# Patient Record
Sex: Male | Born: 1935 | Race: White | Hispanic: No | Smoking: Former smoker
Health system: Southern US, Community
[De-identification: ages and names within clinical notes are randomized; demographics above are authoritative.]

## PROBLEM LIST (undated history)

## (undated) DIAGNOSIS — I10 Essential (primary) hypertension: Secondary | ICD-10-CM

## (undated) DIAGNOSIS — E119 Type 2 diabetes mellitus without complications: Secondary | ICD-10-CM

## (undated) HISTORY — PX: EYE SURGERY: SHX253

## (undated) HISTORY — PX: CHOLECYSTECTOMY: SHX55

## (undated) HISTORY — PX: CATARACT EXTRACTION, BILATERAL: SHX1313

## (undated) HISTORY — PX: THYROID CYST EXCISION: SHX2511

---

## 2003-09-30 ENCOUNTER — Ambulatory Visit (HOSPITAL_COMMUNITY): Admission: RE | Admit: 2003-09-30 | Discharge: 2003-09-30 | Payer: Self-pay | Admitting: Family Medicine

## 2003-10-12 ENCOUNTER — Ambulatory Visit (HOSPITAL_COMMUNITY): Admission: RE | Admit: 2003-10-12 | Discharge: 2003-10-12 | Payer: Self-pay | Admitting: Family Medicine

## 2007-10-07 ENCOUNTER — Ambulatory Visit (HOSPITAL_COMMUNITY): Admission: RE | Admit: 2007-10-07 | Discharge: 2007-10-07 | Payer: Self-pay | Admitting: General Surgery

## 2010-09-14 ENCOUNTER — Inpatient Hospital Stay (HOSPITAL_COMMUNITY): Admission: EM | Admit: 2010-09-14 | Discharge: 2010-09-16 | Payer: Self-pay | Source: Home / Self Care

## 2010-09-14 LAB — URINALYSIS, ROUTINE W REFLEX MICROSCOPIC
Nitrite: NEGATIVE
Protein, ur: NEGATIVE mg/dL
Specific Gravity, Urine: 1.015 (ref 1.005–1.030)
Urobilinogen, UA: 0.2 mg/dL (ref 0.0–1.0)

## 2010-09-14 LAB — CBC
HCT: 38.8 % — ABNORMAL LOW (ref 39.0–52.0)
MCH: 33.4 pg (ref 26.0–34.0)
MCV: 93.3 fL (ref 78.0–100.0)
RBC: 4.16 MIL/uL — ABNORMAL LOW (ref 4.22–5.81)
RDW: 13.8 % (ref 11.5–15.5)
WBC: 6.5 10*3/uL (ref 4.0–10.5)

## 2010-09-14 LAB — DIFFERENTIAL
Eosinophils Absolute: 0.1 10*3/uL (ref 0.0–0.7)
Eosinophils Relative: 1 % (ref 0–5)
Lymphocytes Relative: 28 % (ref 12–46)
Lymphs Abs: 1.8 10*3/uL (ref 0.7–4.0)
Monocytes Relative: 10 % (ref 3–12)

## 2010-09-14 LAB — COMPREHENSIVE METABOLIC PANEL
Alkaline Phosphatase: 65 U/L (ref 39–117)
BUN: 18 mg/dL (ref 6–23)
CO2: 29 mEq/L (ref 19–32)
GFR calc non Af Amer: >60 mL/min (ref >60)
Glucose, Bld: 128 mg/dL — ABNORMAL HIGH (ref 70–99)
Potassium: 4.3 mEq/L (ref 3.5–5.1)
Sodium: 137 mEq/L (ref 135–145)
Total Bilirubin: 0.5 mg/dL (ref 0.3–1.2)

## 2010-09-14 LAB — GLUCOSE, CAPILLARY: Glucose-Capillary: 185 mg/dL — ABNORMAL HIGH (ref 70–99)

## 2010-09-14 LAB — POCT CARDIAC MARKERS: Troponin i, poc: <0.05 ng/mL (ref 0.00–0.09)

## 2010-09-15 LAB — COMPREHENSIVE METABOLIC PANEL
ALT: 14 U/L (ref 0–53)
Alkaline Phosphatase: 47 U/L (ref 39–117)
CO2: 25 mEq/L (ref 19–32)
Chloride: 109 mEq/L (ref 96–112)
Glucose, Bld: 231 mg/dL — ABNORMAL HIGH (ref 70–99)
Potassium: 4.3 mEq/L (ref 3.5–5.1)
Sodium: 137 mEq/L (ref 135–145)
Total Bilirubin: 0.3 mg/dL (ref 0.3–1.2)
Total Protein: 5.1 g/dL — ABNORMAL LOW (ref 6.0–8.3)

## 2010-09-15 LAB — GLUCOSE, CAPILLARY
Glucose-Capillary: 166 mg/dL — ABNORMAL HIGH (ref 70–99)
Glucose-Capillary: 254 mg/dL — ABNORMAL HIGH (ref 70–99)

## 2010-09-15 LAB — TSH: TSH: 3.218 u[IU]/mL (ref 0.350–4.500)

## 2010-09-15 LAB — CALCIUM
Calcium: 11.6 mg/dL — ABNORMAL HIGH (ref 8.4–10.5)
Calcium: 11.3 mg/dL — ABNORMAL HIGH (ref 8.4–10.5)

## 2010-09-16 LAB — GLUCOSE, CAPILLARY
Glucose-Capillary: 70 mg/dL (ref 70–99)
Glucose-Capillary: 175 mg/dL — ABNORMAL HIGH (ref 70–99)

## 2010-09-16 LAB — CALCIUM: Calcium: 10.9 mg/dL — ABNORMAL HIGH (ref 8.4–10.5)

## 2010-09-17 LAB — VITAMIN D 1,25 DIHYDROXY
Vitamin D 1, 25 (OH)2 Total: 73 pg/mL — ABNORMAL HIGH (ref 18–72)
Vitamin D2 1, 25 (OH)2: <8 pg/mL
Vitamin D3 1, 25 (OH)2: 73 pg/mL

## 2010-09-18 ENCOUNTER — Encounter (HOSPITAL_COMMUNITY)
Admission: RE | Admit: 2010-09-18 | Discharge: 2010-09-19 | Payer: Self-pay | Source: Home / Self Care | Attending: General Surgery | Admitting: General Surgery

## 2010-09-19 ENCOUNTER — Ambulatory Visit (HOSPITAL_COMMUNITY): Admission: RE | Admit: 2010-09-19 | Payer: Self-pay | Source: Home / Self Care | Admitting: General Surgery

## 2010-09-19 ENCOUNTER — Encounter (HOSPITAL_COMMUNITY)
Admission: RE | Admit: 2010-09-19 | Discharge: 2010-09-19 | Payer: Self-pay | Source: Home / Self Care | Attending: General Surgery | Admitting: General Surgery

## 2010-09-23 ENCOUNTER — Encounter: Payer: Self-pay | Admitting: General Surgery

## 2010-09-23 NOTE — Discharge Summary (Signed)
NAME:  Jeremy Ferrell, Jeremy Ferrell NO.:  [id number]  MEDICAL RECORD NO.:  [id number]          PATIENT TYPE:  OBV  LOCATION:  A304                          FACILITY:  [facility]  PHYSICIAN:  Pleas Koch, MD        DATE OF BIRTH:  Jan 25, 1935  DATE OF ADMISSION:  09/14/2010 DATE OF DISCHARGE:  01/28/2012LH                              DISCHARGE SUMMARY   PRIMARY CARE PHYSICIAN:  Corrie Mckusick, MD  REFERRING PHYSICIAN:  Donnetta Hutching, MD  CONSULTS:  Dr. Malvin Johns with the surgery.  DISCHARGE DIAGNOSES: 1. Hypercalcemia likely secondary to primary hyperparathyroidism from     an adenoma. 2. Diabetes mellitus, well controlled. 3. Hypertension. 4. Hyperlipidemia. 5. Tachycardia (resolved).  DISCHARGE MEDICATIONS: 1. Ramipril 10 mg 1 tablet daily. 2. Metoprolol tartrate 50 mg 1 tablet b.i.d. 3. Vytorin 10/20 one tablet at bedtime. 4. Lantus 65 units at bedtime. 5. Starlix 120 mg p.o. t.i.d. 6. Hydrochlorothiazide 25 mg 1 tablet daily. 7. Actos met 15/500 two tablets daily, Actoplus. 8. Metformin 1 tablet at bedtime. 9. New medications may include pamidronate 90 mg IV every 10 days if     the patient does not have surgery.  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 Dickey Gave, status post cholecystectomy, hypertension, hyperlipidemia and former smoker who presents with elevated calcium level.  Checked at Dr. Lamar Blinks office to 13.7 and the patient actually had no altered mental status, nausea, vomiting, diarrhea, moans, groans, abdominal pain.  He had no neurological dysfunction,  stupor, coma, and denied any anorexia, muscle weakness either.  He was hospitalized for moderate hypercalcemia, which was relatively asymptomatic and he was started on aggressive fluid repletion 200 mL an hour.  Subsequently, this was decreased on day of discharge. He had an  extensive workup, which included a parathyroid level hormone, which was 266.2.  His calcium was also elevated at 13.5, which points to primary hyperparathyroidism, which would likely include adenoma 85% of these are adenomatous.  His phosphorus was 1.5, which would be expected as being low.  I consulted on Dr. Malvin Johns with general surgery who recommends that the patient has a technetium uptake scan of the area as an outpatient and he is actually scheduled that.  He is also recommended consulting Dr. Synetta Fail for this surgery and has stated to me that the surgical take place in likely 1 week.  In attendance of this, if he does not have this surgery, I will place him on pamidronate 90 mg IV every 10-14 days as per his primary care physician Dr. Phillips Odor.  The patient have a calcium level repeated at primary care physician's office in 3-5 days and he understands this. But the patient is stable to go.  He received one dose pamidronate and is currently getting IV fluids to ensure adequate distribution of this and no acute renal insufficiency secondary to this.  I have counseled him to drink copious amounts of liquids subsequent to this and I had recommended to the patient likely we beneficial to stay another day. However, the  patient is not willing to do the same.  As such, the patient just needs to have CMET done in addition to a calcium, phosphorus levels in the outpatient setting.  The patient is agreeable to this.  His diabetes well controlled in the hospital.  He was kept on insulin, kept off his other oral hypoglycemic agents, which will be reimplanted with caution on discharge given him using bisphosphonate.  His BUN and creatinine on discharge were 18 and 0.93, which is better than admission 18 and 1.06.  Hypertension.  This is well-controlled in hospital setting and the patient was stable from this standpoint.  I see no reason why he should not undergo surgery as he has no real  cardiac history in the past.                                           ______________________________ Pleas Koch, MD     JS/MEDQ  D:  09/16/2010  T:  09/16/2010  Job:  [id number]  cc:   Corrie Mckusick, M.D. Fax: [phone]  Barbaraann Barthel, M.D. Fax: [phone]  Electronically Signed by Pleas Koch MD on 09/23/2010 06:41:57 PM

## 2010-09-23 NOTE — H&P (Signed)
NAME:  JENCARLO, BONADONNA NO.:  [id number]  MEDICAL RECORD NO.:  [id number]          PATIENT TYPE:  EMS  LOCATION:  ED                            FACILITY:  [facility]  PHYSICIAN:  Pleas Koch, MD        DATE OF BIRTH:  October 08, 1934  DATE OF ADMISSION:  09/14/2010 DATE OF DISCHARGE:  LH                             HISTORY & PHYSICAL   PRIMARY CARE PHYSICIAN:  Corrie Mckusick, MD  REFERRING PHYSICIAN:  Donnetta Hutching, MD  CHIEF COMPLAINT:  Abnormal calcium level.  HISTORY OF PRESENT ILLNESS:  This is a pleasant 76 year old Caucasian male with known history of hypertension, cataract disease, history of thyroglossal cyst removal in the 1950s by Dr. Dickey Gave, status post cholecystectomy, hypertension, hyperlipidemia and former smoker who presents with elevated calcium level that was rechecked at Dr. Lamar Blinks office this morning after prior one being 13 last night.  It was noted to be similarly elevated and the patient was sent to the ED for workup. The patient actually has no real complaints of increased thirst or nausea, vomiting, diarrhea or diureses.  He states he has no neurological dysfunction, stupor, coma, constipation, anorexia and nausea.  Denies any anorexia or muscle weakness either.  In fact, the patient is not really sure what all the fuss is about.  PAST MEDICAL HISTORY:  Significant for above.  PAST SURGICAL HISTORY:  The patient states he had thyroglossal cyst, taken out about 20 years ago by Dr. Dickey Gave for an abscess and part of the hyoid bone was removed as well.  He also had gallbladder removed about 30 years ago.  He has hyperlipidemia and is on statin for this. He also has macular degeneration and cataracts, which have been removed couple of years ago.  He states that he has not taken any new medications and denies taking more than his regular dose of hydrochlorothiazide.  SOCIAL HISTORY:  He used to smoke about 30 years ago.  Smoked half pack per  day for 10-15 years and used strength beer occasionally in the past. Does not drink any more.  The patient used to work at [employer] and retired 32 years, at age 70.  He looks at home alone right now.  ALLERGIES:  The patient is allergic to PENICILLIN.  MEDICATIONS:  From home are not reconsulted, but include; 1. Vytorin 10/20 once a day. 2. Starlix 120 mg 3 times day. 3. Metoprolol tartrate 50 mg twice daily. 4. Lantus 65 units at night per patient report. 5. Hydrochlorothiazide 12.5 mg per the patient report. 6. Actoplus 15/500 daily. 7. Altace 10 mg once daily.  FAMILY HISTORY:  His father had cancer of the lung and died of this at the age of 68.  Mother had lymphoma, but died from bacterial meningitis at age of 24.  Review of his chart actually reveals that he has been seen by Dr. Lovell Sheehan in 2009 and in fact did have a screening colonoscopy results of which are not available at this point in time.  The patient also has radiological abnormality in 2005 that he states has been there for a while, it looks  like he had area of scarring and pleural thickening in the right lung base with small amounts of calcification probably representing old granulomatous lesion and they recommended followup CT in 4-6 months to establish stability.  PHYSICAL EXAMINATION:  GENERAL:  The patient is a very pleasant Caucasian male in no acute distress. VITAL SIGNS:  His initial blood pressures are 184/85, pulse was 119, respirations are 24, pulse ox 96 and when these were repeated and it was noted that the patient has not had his beta blocker for this evening. HEENT:  The patient is alert, oriented.  Noted cataract changes.  Left eye fundus did not fully visualized on either side.  No scleral icterus. No pallor.  The patient has scar in the upper neck region near the chin and then another one lower down.  He has no thyromegaly or submandibular lymphadenopathy.  No posterior cervical  lymphadenopathy or supraclavicular lymphadenopathy either. CHEST:  Clinically clear with no tactile vocal fremitus or resonance. HEART:  S1, S2 tachycardic with regular tachycardia and on monitor it seems to be sinus tach.  No murmurs appreciated.  Point of maximal impulse is within the mid axillary line of fifth intercostal space. ABDOMEN:  Soft, nontender, nondistended.  Good bowel sounds. NEUROLOGIC:  He has slightly brisk reflexes; however, grossly cranial nerves II-XII intact.  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  His cardiac markers point of care CK-MB is 1.3, troponin was less than 0.05, myoglobin is 168.  His CBC showed hemoglobin 13.9, hematocrit 38.8, WBC 6.5, platelet count of 269.  His differential is totally normal.  His RDW is 13.8.  CMP shows sodium 137, potassium 4.3, chloride of 102, CO2 of 29, glucose 128, BUN of 18, creatinine 1.06, bilirubin 0.5, alk phos of 65, SGOT of 21, SGPT is 17, total protein 6.6, albumin was 3.9, calcium was 13.7.  Urinalysis was entirely normal.  ASSESSMENT: 1. Moderate hypercalcemia, which is currently asymptomatic.  I will     aggressively hydrate this gentleman to prevent renal compromise     secondary to what could result from acute tubular necrosis     secondary to crystallization of calcium.  We will work him up with     intact PTH level and a phosphorus in addition to labs that were     drawn by the ED.  If further determination, we will rest on these     levels.  It is likely that he may have primary hypocalcemia     secondary to an adenoma versus this being malignancy related in     view of his past smoking history.  Indeed his chest x-ray now shows     a lipoma, which has been documented in 2005, this may warrant     biopsy if the PTH is not elevated.  I had a full disclosure  with     the family and explained to him that this could very well likely be     cancer.  The patient is understanding the same and we will await     results. 2. Diabetes mellitus.  We will hold his hypoglycemics other than for     his Lantus 65 units q.p.m. and we will cover with sliding scale     insulin. 3. Hypertension.  I will discontinue hydrochlorothiazide for now and  we will keep him on his regular home medications for blood pressure     other than that I may have to increase his medications.  We will do     so transiently. 4. Hyperlipidemia.  Continue Vytorin. 5. Tachycardia, this is likely secondary to rebound tachycardia from     withdrawal beta-blocker and we will not aggressively work this out.                                           ______________________________ Pleas Koch, MD     JS/MEDQ  D:  09/14/2010  T:  09/14/2010  Job:  [id number]  cc:   Corrie Mckusick, M.D. Fax: [phone]  Electronically Signed by Pleas Koch MD on 09/23/2010 06:42:41 PM

## 2010-09-25 ENCOUNTER — Other Ambulatory Visit (HOSPITAL_COMMUNITY): Payer: Self-pay | Admitting: Internal Medicine

## 2010-09-25 NOTE — Consult Note (Signed)
  NAME:  Jeremy Ferrell, Jeremy Ferrell NO.:  [id number]  MEDICAL RECORD NO.:  [id number]          PATIENT TYPE:  OBV  LOCATION:  A304                          FACILITY:  [facility]  PHYSICIAN:  Barbaraann Barthel, M.D. DATE OF BIRTH:  [birth year]  DATE OF CONSULTATION:  09/16/2010 DATE OF DISCHARGE:  09/16/2010                                CONSULTATION   NOTE:  Surgery was asked to see this 76 year old white male for hyperparathyroidism.  This patient was seen and examined and his chart was reviewed.  In essence, he is a 76 year old white male who interestingly 40 years ago underwent surgery by Dr. Loraine Leriche for a thyroglossal cyst.  His other medical problems include diabetes type 1 and hypertension and he presented incidentally with hypocalcemia.  This was worked up when he was noted that his calcium levels were actually a very high at 13.5. They are presently at 10.7 with medical treatment and he underwent an examination of his parathyroid hormone level which showed that this was quite elevated at 266 with a normal range being between 14 and 72. Thyroid-stimulating hormone was also done and this was within normal limits at 3.2 with a normal range being 0.35-4.5.  Clinically, the patient has no palpable masses or adenopathy and then and other review of his labs reviewed that his current calcium level is 10.7 with a phosphorus of 1.5.  He has had no history of bone pain or depression or lethargy or kidney stones and it appears that he has primary hyperparathyroidism from the laboratory data reviewed.  I have discussed the need for further workup with him including a parathyroid scan.  This can be coordinated as an outpatient, although it should be ordered and scheduled at the present time.  I also think that there is benefit seeing the endocrinologist available to as Dr. Avel Peace for his input prior to his discharge.  Surgically, I discussed this frankly with him if this is a  parathyroid adenoma that is obvious and apparent on thyroid scan, I would feel comfortable removing this.  If it is something more complicated, I think a tertiary referral would be necessary as this would go beyond my experience level.  I am comfortable with removing a parathyroid adenoma and have some experience in this.  However, if he has hyperplasia or other findings, I would think would be better served elsewhere.  I discussed this quite frankly with him and we have made followup arrangements in my office.  At that time, I should be able to be to review his parathyroid scan and I can make an appropriate surgical referral at that time.     Barbaraann Barthel, M.D.     WB/MEDQ  D:  09/16/2010  T:  09/17/2010  Job:  [id number]  cc:   Corrie Mckusick, M.D. Fax: [phone]  Electronically Signed by Barbaraann Barthel M.D. on 09/25/2010 11:06:01 AM

## 2010-09-26 ENCOUNTER — Ambulatory Visit (HOSPITAL_COMMUNITY)
Admission: RE | Admit: 2010-09-26 | Discharge: 2010-09-26 | Disposition: A | Discharge status: Home / Self Care | Payer: Medicare Other | Source: Ambulatory Visit | Attending: Internal Medicine | Admitting: Internal Medicine

## 2010-09-26 DIAGNOSIS — E042 Nontoxic multinodular goiter: Secondary | ICD-10-CM | POA: Insufficient documentation

## 2011-01-02 NOTE — H&P (Signed)
NAME:  LOTT, SEELBACH NO.:  [id number]   MEDICAL RECORD NO.:  [id number]           PATIENT TYPE:  AMB   LOCATION:  DAY                           FACILITY:  [facility]   PHYSICIAN:  Dalia Heading, M.D.  DATE OF BIRTH:  1935/06/30   DATE OF ADMISSION:  DATE OF DISCHARGE:  LH                              HISTORY & PHYSICAL   CHIEF COMPLAINT:  Need for screening colonoscopy.   HISTORY OF PRESENT ILLNESS:  Patient is a 76 year old white male who is  referred for endoscopic evaluation.  He needs a colonoscopy for  screening purposes.  No abdominal pain, weight loss, nausea, vomiting,  diarrhea, constipation, melena, or hematochezia have been noted.  He has  never had a colonoscopy.  There is no family history of colon carcinoma.   PAST MEDICAL HISTORY:  Includes:  1. Insulin-dependent diabetes mellitus.  2. Hypertension.   PAST SURGICAL HISTORY:  Unremarkable.   CURRENT MEDICATIONS:  1. Actoplus.  2. Lantus insulin.  3. Starlix.  4. Ramipril.  5. Vytorin.  6. Metoprolol.  7. Hydrochlorothiazide.   ALLERGIES:  PENICILLIN.   REVIEW OF SYSTEMS:  Noncontributory.   PHYSICAL EXAMINATION:  Patient is a well-developed, well-nourished,  white male in no acute distress.  LUNGS:  Clear to auscultation with equal breath sounds bilaterally.  HEART:  Examination reveals a regular rate and rhythm without S3, S4,  murmurs.  ABDOMEN:  Soft, nontender, nondistended.  No hepatosplenomegaly or  masses are noted.  RECTAL:  Examination was deferred to the procedure.   IMPRESSION:  Need for screening colonoscopy.   PLAN:  The patient is scheduled for a colonoscopy on October 07, 2007.  The risks and benefits of the procedure including bleeding and  perforation were fully explained to the patient, gave informed consent.      Dalia Heading, M.D.  Electronically Signed     MAJ/MEDQ  D:  09/25/2007  T:  09/26/2007  Job:  [id number]   cc:   Dalia Heading, M.D.  Fax:  [phone]   Corrie Mckusick, M.D.  Fax: [phone]

## 2011-11-06 DIAGNOSIS — E785 Hyperlipidemia, unspecified: Secondary | ICD-10-CM | POA: Diagnosis not present

## 2011-11-06 DIAGNOSIS — J069 Acute upper respiratory infection, unspecified: Secondary | ICD-10-CM | POA: Diagnosis not present

## 2011-11-06 DIAGNOSIS — E119 Type 2 diabetes mellitus without complications: Secondary | ICD-10-CM | POA: Diagnosis not present

## 2011-11-06 DIAGNOSIS — I10 Essential (primary) hypertension: Secondary | ICD-10-CM | POA: Diagnosis not present

## 2011-11-06 DIAGNOSIS — Z6831 Body mass index (BMI) 31.0-31.9, adult: Secondary | ICD-10-CM | POA: Diagnosis not present

## 2011-11-12 DIAGNOSIS — H35059 Retinal neovascularization, unspecified, unspecified eye: Secondary | ICD-10-CM | POA: Diagnosis not present

## 2011-11-12 DIAGNOSIS — H353190 Nonexudative age-related macular degeneration, unspecified eye, stage unspecified: Secondary | ICD-10-CM | POA: Diagnosis not present

## 2011-11-12 DIAGNOSIS — H353290 Exudative age-related macular degeneration, unspecified eye, stage unspecified: Secondary | ICD-10-CM | POA: Diagnosis not present

## 2011-11-12 DIAGNOSIS — H43819 Vitreous degeneration, unspecified eye: Secondary | ICD-10-CM | POA: Diagnosis not present

## 2012-03-17 DIAGNOSIS — Z6832 Body mass index (BMI) 32.0-32.9, adult: Secondary | ICD-10-CM | POA: Diagnosis not present

## 2012-03-17 DIAGNOSIS — I10 Essential (primary) hypertension: Secondary | ICD-10-CM | POA: Diagnosis not present

## 2012-03-17 DIAGNOSIS — E109 Type 1 diabetes mellitus without complications: Secondary | ICD-10-CM | POA: Diagnosis not present

## 2012-03-17 DIAGNOSIS — E785 Hyperlipidemia, unspecified: Secondary | ICD-10-CM | POA: Diagnosis not present

## 2012-06-19 DIAGNOSIS — Z23 Encounter for immunization: Secondary | ICD-10-CM | POA: Diagnosis not present

## 2012-07-01 DIAGNOSIS — N401 Enlarged prostate with lower urinary tract symptoms: Secondary | ICD-10-CM | POA: Diagnosis not present

## 2012-07-08 DIAGNOSIS — R972 Elevated prostate specific antigen [PSA]: Secondary | ICD-10-CM | POA: Diagnosis not present

## 2012-07-08 DIAGNOSIS — N401 Enlarged prostate with lower urinary tract symptoms: Secondary | ICD-10-CM | POA: Diagnosis not present

## 2012-07-24 DIAGNOSIS — E119 Type 2 diabetes mellitus without complications: Secondary | ICD-10-CM | POA: Diagnosis not present

## 2012-07-24 DIAGNOSIS — I10 Essential (primary) hypertension: Secondary | ICD-10-CM | POA: Diagnosis not present

## 2012-07-28 DIAGNOSIS — Z0000 Encounter for general adult medical examination without abnormal findings: Secondary | ICD-10-CM | POA: Diagnosis not present

## 2012-10-28 DIAGNOSIS — Z6832 Body mass index (BMI) 32.0-32.9, adult: Secondary | ICD-10-CM | POA: Diagnosis not present

## 2012-10-28 DIAGNOSIS — E109 Type 1 diabetes mellitus without complications: Secondary | ICD-10-CM | POA: Diagnosis not present

## 2012-10-28 DIAGNOSIS — E785 Hyperlipidemia, unspecified: Secondary | ICD-10-CM | POA: Diagnosis not present

## 2012-10-28 DIAGNOSIS — I10 Essential (primary) hypertension: Secondary | ICD-10-CM | POA: Diagnosis not present

## 2012-11-14 DIAGNOSIS — H43819 Vitreous degeneration, unspecified eye: Secondary | ICD-10-CM | POA: Diagnosis not present

## 2012-11-14 DIAGNOSIS — H353190 Nonexudative age-related macular degeneration, unspecified eye, stage unspecified: Secondary | ICD-10-CM | POA: Diagnosis not present

## 2012-11-14 DIAGNOSIS — H35059 Retinal neovascularization, unspecified, unspecified eye: Secondary | ICD-10-CM | POA: Diagnosis not present

## 2012-11-14 DIAGNOSIS — H353290 Exudative age-related macular degeneration, unspecified eye, stage unspecified: Secondary | ICD-10-CM | POA: Diagnosis not present

## 2012-11-21 DIAGNOSIS — H353290 Exudative age-related macular degeneration, unspecified eye, stage unspecified: Secondary | ICD-10-CM | POA: Diagnosis not present

## 2012-11-21 DIAGNOSIS — H353190 Nonexudative age-related macular degeneration, unspecified eye, stage unspecified: Secondary | ICD-10-CM | POA: Diagnosis not present

## 2012-12-19 DIAGNOSIS — H35059 Retinal neovascularization, unspecified, unspecified eye: Secondary | ICD-10-CM | POA: Diagnosis not present

## 2012-12-19 DIAGNOSIS — H353290 Exudative age-related macular degeneration, unspecified eye, stage unspecified: Secondary | ICD-10-CM | POA: Diagnosis not present

## 2013-01-21 DIAGNOSIS — H353190 Nonexudative age-related macular degeneration, unspecified eye, stage unspecified: Secondary | ICD-10-CM | POA: Diagnosis not present

## 2013-01-21 DIAGNOSIS — H35059 Retinal neovascularization, unspecified, unspecified eye: Secondary | ICD-10-CM | POA: Diagnosis not present

## 2013-01-21 DIAGNOSIS — H353290 Exudative age-related macular degeneration, unspecified eye, stage unspecified: Secondary | ICD-10-CM | POA: Diagnosis not present

## 2013-01-21 DIAGNOSIS — H43819 Vitreous degeneration, unspecified eye: Secondary | ICD-10-CM | POA: Diagnosis not present

## 2013-03-02 DIAGNOSIS — Z23 Encounter for immunization: Secondary | ICD-10-CM | POA: Diagnosis not present

## 2013-03-02 DIAGNOSIS — Z6832 Body mass index (BMI) 32.0-32.9, adult: Secondary | ICD-10-CM | POA: Diagnosis not present

## 2013-03-02 DIAGNOSIS — E785 Hyperlipidemia, unspecified: Secondary | ICD-10-CM | POA: Diagnosis not present

## 2013-03-02 DIAGNOSIS — E109 Type 1 diabetes mellitus without complications: Secondary | ICD-10-CM | POA: Diagnosis not present

## 2013-03-02 DIAGNOSIS — I10 Essential (primary) hypertension: Secondary | ICD-10-CM | POA: Diagnosis not present

## 2013-03-13 DIAGNOSIS — H43819 Vitreous degeneration, unspecified eye: Secondary | ICD-10-CM | POA: Diagnosis not present

## 2013-03-13 DIAGNOSIS — H353290 Exudative age-related macular degeneration, unspecified eye, stage unspecified: Secondary | ICD-10-CM | POA: Diagnosis not present

## 2013-04-01 DIAGNOSIS — Z23 Encounter for immunization: Secondary | ICD-10-CM | POA: Diagnosis not present

## 2013-04-24 DIAGNOSIS — H353190 Nonexudative age-related macular degeneration, unspecified eye, stage unspecified: Secondary | ICD-10-CM | POA: Diagnosis not present

## 2013-04-24 DIAGNOSIS — H353290 Exudative age-related macular degeneration, unspecified eye, stage unspecified: Secondary | ICD-10-CM | POA: Diagnosis not present

## 2013-04-24 DIAGNOSIS — H35059 Retinal neovascularization, unspecified, unspecified eye: Secondary | ICD-10-CM | POA: Diagnosis not present

## 2013-04-24 DIAGNOSIS — H43819 Vitreous degeneration, unspecified eye: Secondary | ICD-10-CM | POA: Diagnosis not present

## 2013-06-05 DIAGNOSIS — H353290 Exudative age-related macular degeneration, unspecified eye, stage unspecified: Secondary | ICD-10-CM | POA: Diagnosis not present

## 2013-06-05 DIAGNOSIS — H35059 Retinal neovascularization, unspecified, unspecified eye: Secondary | ICD-10-CM | POA: Diagnosis not present

## 2013-06-05 DIAGNOSIS — H43819 Vitreous degeneration, unspecified eye: Secondary | ICD-10-CM | POA: Diagnosis not present

## 2013-06-08 DIAGNOSIS — Z23 Encounter for immunization: Secondary | ICD-10-CM | POA: Diagnosis not present

## 2013-07-01 DIAGNOSIS — R972 Elevated prostate specific antigen [PSA]: Secondary | ICD-10-CM | POA: Diagnosis not present

## 2013-07-08 DIAGNOSIS — N139 Obstructive and reflux uropathy, unspecified: Secondary | ICD-10-CM | POA: Diagnosis not present

## 2013-07-08 DIAGNOSIS — N401 Enlarged prostate with lower urinary tract symptoms: Secondary | ICD-10-CM | POA: Diagnosis not present

## 2013-07-08 DIAGNOSIS — R972 Elevated prostate specific antigen [PSA]: Secondary | ICD-10-CM | POA: Diagnosis not present

## 2013-07-24 DIAGNOSIS — H353190 Nonexudative age-related macular degeneration, unspecified eye, stage unspecified: Secondary | ICD-10-CM | POA: Diagnosis not present

## 2013-07-24 DIAGNOSIS — E11329 Type 2 diabetes mellitus with mild nonproliferative diabetic retinopathy without macular edema: Secondary | ICD-10-CM | POA: Diagnosis not present

## 2013-07-24 DIAGNOSIS — H35059 Retinal neovascularization, unspecified, unspecified eye: Secondary | ICD-10-CM | POA: Diagnosis not present

## 2013-07-24 DIAGNOSIS — H353290 Exudative age-related macular degeneration, unspecified eye, stage unspecified: Secondary | ICD-10-CM | POA: Diagnosis not present

## 2013-08-04 DIAGNOSIS — E785 Hyperlipidemia, unspecified: Secondary | ICD-10-CM | POA: Diagnosis not present

## 2013-08-04 DIAGNOSIS — I10 Essential (primary) hypertension: Secondary | ICD-10-CM | POA: Diagnosis not present

## 2013-08-04 DIAGNOSIS — Z6832 Body mass index (BMI) 32.0-32.9, adult: Secondary | ICD-10-CM | POA: Diagnosis not present

## 2013-08-04 DIAGNOSIS — J209 Acute bronchitis, unspecified: Secondary | ICD-10-CM | POA: Diagnosis not present

## 2013-08-04 DIAGNOSIS — E119 Type 2 diabetes mellitus without complications: Secondary | ICD-10-CM | POA: Diagnosis not present

## 2013-08-10 DIAGNOSIS — D0460 Carcinoma in situ of skin of unspecified upper limb, including shoulder: Secondary | ICD-10-CM | POA: Diagnosis not present

## 2013-08-10 DIAGNOSIS — L820 Inflamed seborrheic keratosis: Secondary | ICD-10-CM | POA: Diagnosis not present

## 2013-08-10 DIAGNOSIS — D235 Other benign neoplasm of skin of trunk: Secondary | ICD-10-CM | POA: Diagnosis not present

## 2013-08-10 DIAGNOSIS — L570 Actinic keratosis: Secondary | ICD-10-CM | POA: Diagnosis not present

## 2013-09-01 DIAGNOSIS — Z23 Encounter for immunization: Secondary | ICD-10-CM | POA: Diagnosis not present

## 2013-09-04 DIAGNOSIS — H35059 Retinal neovascularization, unspecified, unspecified eye: Secondary | ICD-10-CM | POA: Diagnosis not present

## 2013-09-04 DIAGNOSIS — H353290 Exudative age-related macular degeneration, unspecified eye, stage unspecified: Secondary | ICD-10-CM | POA: Diagnosis not present

## 2013-09-04 DIAGNOSIS — E11329 Type 2 diabetes mellitus with mild nonproliferative diabetic retinopathy without macular edema: Secondary | ICD-10-CM | POA: Diagnosis not present

## 2013-09-04 DIAGNOSIS — H353190 Nonexudative age-related macular degeneration, unspecified eye, stage unspecified: Secondary | ICD-10-CM | POA: Diagnosis not present

## 2013-10-16 DIAGNOSIS — H35059 Retinal neovascularization, unspecified, unspecified eye: Secondary | ICD-10-CM | POA: Diagnosis not present

## 2013-10-16 DIAGNOSIS — H353190 Nonexudative age-related macular degeneration, unspecified eye, stage unspecified: Secondary | ICD-10-CM | POA: Diagnosis not present

## 2013-10-16 DIAGNOSIS — H353290 Exudative age-related macular degeneration, unspecified eye, stage unspecified: Secondary | ICD-10-CM | POA: Diagnosis not present

## 2013-11-02 DIAGNOSIS — Z6832 Body mass index (BMI) 32.0-32.9, adult: Secondary | ICD-10-CM | POA: Diagnosis not present

## 2013-11-02 DIAGNOSIS — E109 Type 1 diabetes mellitus without complications: Secondary | ICD-10-CM | POA: Diagnosis not present

## 2013-11-02 DIAGNOSIS — E785 Hyperlipidemia, unspecified: Secondary | ICD-10-CM | POA: Diagnosis not present

## 2013-11-02 DIAGNOSIS — I10 Essential (primary) hypertension: Secondary | ICD-10-CM | POA: Diagnosis not present

## 2013-11-02 DIAGNOSIS — Z23 Encounter for immunization: Secondary | ICD-10-CM | POA: Diagnosis not present

## 2013-11-02 DIAGNOSIS — J069 Acute upper respiratory infection, unspecified: Secondary | ICD-10-CM | POA: Diagnosis not present

## 2013-11-03 DIAGNOSIS — H353290 Exudative age-related macular degeneration, unspecified eye, stage unspecified: Secondary | ICD-10-CM | POA: Diagnosis not present

## 2013-11-17 ENCOUNTER — Ambulatory Visit (HOSPITAL_COMMUNITY)
Admission: RE | Admit: 2013-11-17 | Discharge: 2013-11-17 | Disposition: A | Discharge status: Home / Self Care | Payer: Medicare Other | Source: Ambulatory Visit | Attending: Family Medicine | Admitting: Family Medicine

## 2013-11-17 ENCOUNTER — Other Ambulatory Visit (HOSPITAL_COMMUNITY): Payer: Self-pay | Admitting: Family Medicine

## 2013-11-17 DIAGNOSIS — R059 Cough, unspecified: Secondary | ICD-10-CM | POA: Insufficient documentation

## 2013-11-17 DIAGNOSIS — R509 Fever, unspecified: Secondary | ICD-10-CM | POA: Diagnosis not present

## 2013-11-17 DIAGNOSIS — J329 Chronic sinusitis, unspecified: Secondary | ICD-10-CM | POA: Diagnosis not present

## 2013-11-17 DIAGNOSIS — J189 Pneumonia, unspecified organism: Secondary | ICD-10-CM | POA: Diagnosis not present

## 2013-11-17 DIAGNOSIS — Z87891 Personal history of nicotine dependence: Secondary | ICD-10-CM | POA: Insufficient documentation

## 2013-11-17 DIAGNOSIS — Z6831 Body mass index (BMI) 31.0-31.9, adult: Secondary | ICD-10-CM | POA: Diagnosis not present

## 2013-11-17 DIAGNOSIS — R05 Cough: Secondary | ICD-10-CM

## 2013-11-17 DIAGNOSIS — J984 Other disorders of lung: Secondary | ICD-10-CM | POA: Diagnosis not present

## 2013-11-18 ENCOUNTER — Other Ambulatory Visit (HOSPITAL_COMMUNITY): Payer: Self-pay | Admitting: Family Medicine

## 2013-11-18 DIAGNOSIS — R059 Cough, unspecified: Secondary | ICD-10-CM

## 2013-11-18 DIAGNOSIS — R05 Cough: Secondary | ICD-10-CM

## 2013-11-18 DIAGNOSIS — R9389 Abnormal findings on diagnostic imaging of other specified body structures: Secondary | ICD-10-CM

## 2013-11-19 ENCOUNTER — Ambulatory Visit (HOSPITAL_COMMUNITY)
Admission: RE | Admit: 2013-11-19 | Discharge: 2013-11-19 | Disposition: A | Discharge status: Home / Self Care | Payer: Medicare Other | Source: Ambulatory Visit | Attending: Family Medicine | Admitting: Family Medicine

## 2013-11-19 ENCOUNTER — Encounter (HOSPITAL_COMMUNITY): Payer: Self-pay

## 2013-11-19 DIAGNOSIS — J189 Pneumonia, unspecified organism: Secondary | ICD-10-CM | POA: Insufficient documentation

## 2013-11-19 DIAGNOSIS — R05 Cough: Secondary | ICD-10-CM | POA: Diagnosis not present

## 2013-11-19 DIAGNOSIS — J984 Other disorders of lung: Secondary | ICD-10-CM | POA: Diagnosis not present

## 2013-11-19 DIAGNOSIS — R9389 Abnormal findings on diagnostic imaging of other specified body structures: Secondary | ICD-10-CM

## 2013-11-19 DIAGNOSIS — R059 Cough, unspecified: Secondary | ICD-10-CM | POA: Insufficient documentation

## 2013-11-19 HISTORY — DX: Type 2 diabetes mellitus without complications: E11.9

## 2013-11-19 LAB — POCT I-STAT CREATININE: Creatinine, Ser: 1.3 mg/dL (ref 0.50–1.35)

## 2013-11-19 MED ORDER — IOHEXOL 300 MG/ML  SOLN
80.0000 mL | Freq: Once | INTRAMUSCULAR | Status: AC | PRN
  Administered 2013-11-19: 80 mL via INTRAVENOUS

## 2013-12-08 DIAGNOSIS — H353290 Exudative age-related macular degeneration, unspecified eye, stage unspecified: Secondary | ICD-10-CM | POA: Diagnosis not present

## 2013-12-08 DIAGNOSIS — H35059 Retinal neovascularization, unspecified, unspecified eye: Secondary | ICD-10-CM | POA: Diagnosis not present

## 2013-12-08 DIAGNOSIS — H353190 Nonexudative age-related macular degeneration, unspecified eye, stage unspecified: Secondary | ICD-10-CM | POA: Diagnosis not present

## 2013-12-08 DIAGNOSIS — H43819 Vitreous degeneration, unspecified eye: Secondary | ICD-10-CM | POA: Diagnosis not present

## 2014-01-08 DIAGNOSIS — H353290 Exudative age-related macular degeneration, unspecified eye, stage unspecified: Secondary | ICD-10-CM | POA: Diagnosis not present

## 2014-01-08 DIAGNOSIS — H35059 Retinal neovascularization, unspecified, unspecified eye: Secondary | ICD-10-CM | POA: Diagnosis not present

## 2014-01-28 DIAGNOSIS — Z6830 Body mass index (BMI) 30.0-30.9, adult: Secondary | ICD-10-CM | POA: Diagnosis not present

## 2014-01-28 DIAGNOSIS — IMO0001 Reserved for inherently not codable concepts without codable children: Secondary | ICD-10-CM | POA: Diagnosis not present

## 2014-01-28 DIAGNOSIS — Z0000 Encounter for general adult medical examination without abnormal findings: Secondary | ICD-10-CM | POA: Diagnosis not present

## 2014-01-30 DIAGNOSIS — Z6830 Body mass index (BMI) 30.0-30.9, adult: Secondary | ICD-10-CM | POA: Diagnosis not present

## 2014-01-30 DIAGNOSIS — IMO0001 Reserved for inherently not codable concepts without codable children: Secondary | ICD-10-CM | POA: Diagnosis not present

## 2014-02-03 DIAGNOSIS — Z0000 Encounter for general adult medical examination without abnormal findings: Secondary | ICD-10-CM | POA: Diagnosis not present

## 2014-02-03 DIAGNOSIS — IMO0001 Reserved for inherently not codable concepts without codable children: Secondary | ICD-10-CM | POA: Diagnosis not present

## 2014-02-03 DIAGNOSIS — Z6830 Body mass index (BMI) 30.0-30.9, adult: Secondary | ICD-10-CM | POA: Diagnosis not present

## 2014-03-02 DIAGNOSIS — H35059 Retinal neovascularization, unspecified, unspecified eye: Secondary | ICD-10-CM | POA: Diagnosis not present

## 2014-03-02 DIAGNOSIS — H353290 Exudative age-related macular degeneration, unspecified eye, stage unspecified: Secondary | ICD-10-CM | POA: Diagnosis not present

## 2014-04-07 DIAGNOSIS — H35059 Retinal neovascularization, unspecified, unspecified eye: Secondary | ICD-10-CM | POA: Diagnosis not present

## 2014-04-07 DIAGNOSIS — H353290 Exudative age-related macular degeneration, unspecified eye, stage unspecified: Secondary | ICD-10-CM | POA: Diagnosis not present

## 2014-05-03 DIAGNOSIS — E119 Type 2 diabetes mellitus without complications: Secondary | ICD-10-CM | POA: Diagnosis not present

## 2014-05-03 DIAGNOSIS — E785 Hyperlipidemia, unspecified: Secondary | ICD-10-CM | POA: Diagnosis not present

## 2014-05-03 DIAGNOSIS — I10 Essential (primary) hypertension: Secondary | ICD-10-CM | POA: Diagnosis not present

## 2014-05-03 DIAGNOSIS — Z6830 Body mass index (BMI) 30.0-30.9, adult: Secondary | ICD-10-CM | POA: Diagnosis not present

## 2014-05-04 DIAGNOSIS — Z23 Encounter for immunization: Secondary | ICD-10-CM | POA: Diagnosis not present

## 2014-05-05 DIAGNOSIS — H35059 Retinal neovascularization, unspecified, unspecified eye: Secondary | ICD-10-CM | POA: Diagnosis not present

## 2014-05-05 DIAGNOSIS — H353290 Exudative age-related macular degeneration, unspecified eye, stage unspecified: Secondary | ICD-10-CM | POA: Diagnosis not present

## 2014-06-02 DIAGNOSIS — H3532 Exudative age-related macular degeneration: Secondary | ICD-10-CM | POA: Diagnosis not present

## 2014-06-02 DIAGNOSIS — H35053 Retinal neovascularization, unspecified, bilateral: Secondary | ICD-10-CM | POA: Diagnosis not present

## 2014-06-30 DIAGNOSIS — R972 Elevated prostate specific antigen [PSA]: Secondary | ICD-10-CM | POA: Diagnosis not present

## 2014-06-30 DIAGNOSIS — H3532 Exudative age-related macular degeneration: Secondary | ICD-10-CM | POA: Diagnosis not present

## 2014-06-30 DIAGNOSIS — N401 Enlarged prostate with lower urinary tract symptoms: Secondary | ICD-10-CM | POA: Diagnosis not present

## 2014-07-09 DIAGNOSIS — N401 Enlarged prostate with lower urinary tract symptoms: Secondary | ICD-10-CM | POA: Diagnosis not present

## 2014-07-09 DIAGNOSIS — R972 Elevated prostate specific antigen [PSA]: Secondary | ICD-10-CM | POA: Diagnosis not present

## 2014-07-09 DIAGNOSIS — R351 Nocturia: Secondary | ICD-10-CM | POA: Diagnosis not present

## 2014-07-12 DIAGNOSIS — C4401 Basal cell carcinoma of skin of lip: Secondary | ICD-10-CM | POA: Diagnosis not present

## 2014-07-12 DIAGNOSIS — L259 Unspecified contact dermatitis, unspecified cause: Secondary | ICD-10-CM | POA: Diagnosis not present

## 2014-07-12 DIAGNOSIS — D225 Melanocytic nevi of trunk: Secondary | ICD-10-CM | POA: Diagnosis not present

## 2014-07-28 DIAGNOSIS — H35053 Retinal neovascularization, unspecified, bilateral: Secondary | ICD-10-CM | POA: Diagnosis not present

## 2014-07-28 DIAGNOSIS — H3532 Exudative age-related macular degeneration: Secondary | ICD-10-CM | POA: Diagnosis not present

## 2014-08-02 DIAGNOSIS — L570 Actinic keratosis: Secondary | ICD-10-CM | POA: Diagnosis not present

## 2014-08-02 DIAGNOSIS — X32XXXD Exposure to sunlight, subsequent encounter: Secondary | ICD-10-CM | POA: Diagnosis not present

## 2014-08-02 DIAGNOSIS — Z08 Encounter for follow-up examination after completed treatment for malignant neoplasm: Secondary | ICD-10-CM | POA: Diagnosis not present

## 2014-08-02 DIAGNOSIS — Z85828 Personal history of other malignant neoplasm of skin: Secondary | ICD-10-CM | POA: Diagnosis not present

## 2014-08-11 DIAGNOSIS — E119 Type 2 diabetes mellitus without complications: Secondary | ICD-10-CM | POA: Diagnosis not present

## 2014-08-11 DIAGNOSIS — I10 Essential (primary) hypertension: Secondary | ICD-10-CM | POA: Diagnosis not present

## 2014-08-11 DIAGNOSIS — Z6830 Body mass index (BMI) 30.0-30.9, adult: Secondary | ICD-10-CM | POA: Diagnosis not present

## 2014-08-11 DIAGNOSIS — E782 Mixed hyperlipidemia: Secondary | ICD-10-CM | POA: Diagnosis not present

## 2014-08-31 DIAGNOSIS — H3532 Exudative age-related macular degeneration: Secondary | ICD-10-CM | POA: Diagnosis not present

## 2014-09-15 DIAGNOSIS — E119 Type 2 diabetes mellitus without complications: Secondary | ICD-10-CM | POA: Diagnosis not present

## 2014-09-28 DIAGNOSIS — H35053 Retinal neovascularization, unspecified, bilateral: Secondary | ICD-10-CM | POA: Diagnosis not present

## 2014-09-28 DIAGNOSIS — H3532 Exudative age-related macular degeneration: Secondary | ICD-10-CM | POA: Diagnosis not present

## 2014-10-26 DIAGNOSIS — H3532 Exudative age-related macular degeneration: Secondary | ICD-10-CM | POA: Diagnosis not present

## 2014-10-26 DIAGNOSIS — E11329 Type 2 diabetes mellitus with mild nonproliferative diabetic retinopathy without macular edema: Secondary | ICD-10-CM | POA: Diagnosis not present

## 2014-12-14 DIAGNOSIS — H3532 Exudative age-related macular degeneration: Secondary | ICD-10-CM | POA: Diagnosis not present

## 2014-12-20 DIAGNOSIS — Z6829 Body mass index (BMI) 29.0-29.9, adult: Secondary | ICD-10-CM | POA: Diagnosis not present

## 2014-12-20 DIAGNOSIS — E119 Type 2 diabetes mellitus without complications: Secondary | ICD-10-CM | POA: Diagnosis not present

## 2014-12-20 DIAGNOSIS — I10 Essential (primary) hypertension: Secondary | ICD-10-CM | POA: Diagnosis not present

## 2014-12-20 DIAGNOSIS — E663 Overweight: Secondary | ICD-10-CM | POA: Diagnosis not present

## 2014-12-20 DIAGNOSIS — E782 Mixed hyperlipidemia: Secondary | ICD-10-CM | POA: Diagnosis not present

## 2015-02-01 DIAGNOSIS — H3532 Exudative age-related macular degeneration: Secondary | ICD-10-CM | POA: Diagnosis not present

## 2015-02-01 DIAGNOSIS — H35053 Retinal neovascularization, unspecified, bilateral: Secondary | ICD-10-CM | POA: Diagnosis not present

## 2015-02-01 DIAGNOSIS — H43813 Vitreous degeneration, bilateral: Secondary | ICD-10-CM | POA: Diagnosis not present

## 2015-02-01 DIAGNOSIS — E11329 Type 2 diabetes mellitus with mild nonproliferative diabetic retinopathy without macular edema: Secondary | ICD-10-CM | POA: Diagnosis not present

## 2015-03-01 DIAGNOSIS — R351 Nocturia: Secondary | ICD-10-CM | POA: Diagnosis not present

## 2015-03-01 DIAGNOSIS — N401 Enlarged prostate with lower urinary tract symptoms: Secondary | ICD-10-CM | POA: Diagnosis not present

## 2015-03-01 DIAGNOSIS — R972 Elevated prostate specific antigen [PSA]: Secondary | ICD-10-CM | POA: Diagnosis not present

## 2015-03-10 ENCOUNTER — Other Ambulatory Visit (HOSPITAL_COMMUNITY): Payer: Self-pay | Admitting: Urology

## 2015-03-10 DIAGNOSIS — R972 Elevated prostate specific antigen [PSA]: Secondary | ICD-10-CM

## 2015-03-15 DIAGNOSIS — H3532 Exudative age-related macular degeneration: Secondary | ICD-10-CM | POA: Diagnosis not present

## 2015-03-15 DIAGNOSIS — H43813 Vitreous degeneration, bilateral: Secondary | ICD-10-CM | POA: Diagnosis not present

## 2015-03-15 DIAGNOSIS — E11329 Type 2 diabetes mellitus with mild nonproliferative diabetic retinopathy without macular edema: Secondary | ICD-10-CM | POA: Diagnosis not present

## 2015-03-15 DIAGNOSIS — H35053 Retinal neovascularization, unspecified, bilateral: Secondary | ICD-10-CM | POA: Diagnosis not present

## 2015-03-21 ENCOUNTER — Ambulatory Visit (HOSPITAL_COMMUNITY)
Admission: RE | Admit: 2015-03-21 | Discharge: 2015-03-21 | Disposition: A | Discharge status: Home / Self Care | Payer: Medicare Other | Source: Ambulatory Visit | Attending: Urology | Admitting: Urology

## 2015-03-21 DIAGNOSIS — R972 Elevated prostate specific antigen [PSA]: Secondary | ICD-10-CM

## 2015-03-21 DIAGNOSIS — K5730 Diverticulosis of large intestine without perforation or abscess without bleeding: Secondary | ICD-10-CM | POA: Diagnosis not present

## 2015-03-21 DIAGNOSIS — N400 Enlarged prostate without lower urinary tract symptoms: Secondary | ICD-10-CM | POA: Insufficient documentation

## 2015-03-21 LAB — POCT I-STAT CREATININE: CREATININE: 1 mg/dL (ref 0.61–1.24)

## 2015-03-21 MED ORDER — GADOBENATE DIMEGLUMINE 529 MG/ML IV SOLN
20.0000 mL | Freq: Once | INTRAVENOUS | Status: AC | PRN
  Administered 2015-03-21: 20 mL via INTRAVENOUS

## 2015-04-20 DIAGNOSIS — N401 Enlarged prostate with lower urinary tract symptoms: Secondary | ICD-10-CM | POA: Diagnosis not present

## 2015-04-20 DIAGNOSIS — R972 Elevated prostate specific antigen [PSA]: Secondary | ICD-10-CM | POA: Diagnosis not present

## 2015-04-20 DIAGNOSIS — R351 Nocturia: Secondary | ICD-10-CM | POA: Diagnosis not present

## 2015-04-26 DIAGNOSIS — E782 Mixed hyperlipidemia: Secondary | ICD-10-CM | POA: Diagnosis not present

## 2015-04-26 DIAGNOSIS — Z6829 Body mass index (BMI) 29.0-29.9, adult: Secondary | ICD-10-CM | POA: Diagnosis not present

## 2015-04-26 DIAGNOSIS — I10 Essential (primary) hypertension: Secondary | ICD-10-CM | POA: Diagnosis not present

## 2015-04-26 DIAGNOSIS — R972 Elevated prostate specific antigen [PSA]: Secondary | ICD-10-CM | POA: Diagnosis not present

## 2015-04-26 DIAGNOSIS — E119 Type 2 diabetes mellitus without complications: Secondary | ICD-10-CM | POA: Diagnosis not present

## 2015-04-26 DIAGNOSIS — Z1389 Encounter for screening for other disorder: Secondary | ICD-10-CM | POA: Diagnosis not present

## 2015-04-26 DIAGNOSIS — E663 Overweight: Secondary | ICD-10-CM | POA: Diagnosis not present

## 2015-04-29 DIAGNOSIS — H3532 Exudative age-related macular degeneration: Secondary | ICD-10-CM | POA: Diagnosis not present

## 2015-04-29 DIAGNOSIS — E11329 Type 2 diabetes mellitus with mild nonproliferative diabetic retinopathy without macular edema: Secondary | ICD-10-CM | POA: Diagnosis not present

## 2015-04-29 DIAGNOSIS — H43813 Vitreous degeneration, bilateral: Secondary | ICD-10-CM | POA: Diagnosis not present

## 2015-04-29 DIAGNOSIS — H35053 Retinal neovascularization, unspecified, bilateral: Secondary | ICD-10-CM | POA: Diagnosis not present

## 2015-05-16 DIAGNOSIS — Z23 Encounter for immunization: Secondary | ICD-10-CM | POA: Diagnosis not present

## 2015-06-10 DIAGNOSIS — H43813 Vitreous degeneration, bilateral: Secondary | ICD-10-CM | POA: Diagnosis not present

## 2015-06-10 DIAGNOSIS — H353222 Exudative age-related macular degeneration, left eye, with inactive choroidal neovascularization: Secondary | ICD-10-CM | POA: Diagnosis not present

## 2015-06-10 DIAGNOSIS — H353211 Exudative age-related macular degeneration, right eye, with active choroidal neovascularization: Secondary | ICD-10-CM | POA: Diagnosis not present

## 2015-07-22 DIAGNOSIS — H353222 Exudative age-related macular degeneration, left eye, with inactive choroidal neovascularization: Secondary | ICD-10-CM | POA: Diagnosis not present

## 2015-07-22 DIAGNOSIS — H353211 Exudative age-related macular degeneration, right eye, with active choroidal neovascularization: Secondary | ICD-10-CM | POA: Diagnosis not present

## 2015-07-22 DIAGNOSIS — H43813 Vitreous degeneration, bilateral: Secondary | ICD-10-CM | POA: Diagnosis not present

## 2015-07-22 DIAGNOSIS — E113291 Type 2 diabetes mellitus with mild nonproliferative diabetic retinopathy without macular edema, right eye: Secondary | ICD-10-CM | POA: Diagnosis not present

## 2015-07-27 DIAGNOSIS — R972 Elevated prostate specific antigen [PSA]: Secondary | ICD-10-CM | POA: Diagnosis not present

## 2015-08-03 DIAGNOSIS — N401 Enlarged prostate with lower urinary tract symptoms: Secondary | ICD-10-CM | POA: Diagnosis not present

## 2015-08-03 DIAGNOSIS — R972 Elevated prostate specific antigen [PSA]: Secondary | ICD-10-CM | POA: Diagnosis not present

## 2015-08-03 DIAGNOSIS — R351 Nocturia: Secondary | ICD-10-CM | POA: Diagnosis not present

## 2015-08-26 DIAGNOSIS — I10 Essential (primary) hypertension: Secondary | ICD-10-CM | POA: Diagnosis not present

## 2015-08-26 DIAGNOSIS — Z1389 Encounter for screening for other disorder: Secondary | ICD-10-CM | POA: Diagnosis not present

## 2015-08-26 DIAGNOSIS — E663 Overweight: Secondary | ICD-10-CM | POA: Diagnosis not present

## 2015-08-26 DIAGNOSIS — E782 Mixed hyperlipidemia: Secondary | ICD-10-CM | POA: Diagnosis not present

## 2015-08-26 DIAGNOSIS — Z6829 Body mass index (BMI) 29.0-29.9, adult: Secondary | ICD-10-CM | POA: Diagnosis not present

## 2015-08-26 DIAGNOSIS — E119 Type 2 diabetes mellitus without complications: Secondary | ICD-10-CM | POA: Diagnosis not present

## 2015-09-02 DIAGNOSIS — H353211 Exudative age-related macular degeneration, right eye, with active choroidal neovascularization: Secondary | ICD-10-CM | POA: Diagnosis not present

## 2015-09-02 DIAGNOSIS — H43813 Vitreous degeneration, bilateral: Secondary | ICD-10-CM | POA: Diagnosis not present

## 2015-09-02 DIAGNOSIS — E113291 Type 2 diabetes mellitus with mild nonproliferative diabetic retinopathy without macular edema, right eye: Secondary | ICD-10-CM | POA: Diagnosis not present

## 2015-09-02 DIAGNOSIS — H353222 Exudative age-related macular degeneration, left eye, with inactive choroidal neovascularization: Secondary | ICD-10-CM | POA: Diagnosis not present

## 2015-10-14 DIAGNOSIS — H353211 Exudative age-related macular degeneration, right eye, with active choroidal neovascularization: Secondary | ICD-10-CM | POA: Diagnosis not present

## 2015-10-14 DIAGNOSIS — E113293 Type 2 diabetes mellitus with mild nonproliferative diabetic retinopathy without macular edema, bilateral: Secondary | ICD-10-CM | POA: Diagnosis not present

## 2015-10-14 DIAGNOSIS — H353222 Exudative age-related macular degeneration, left eye, with inactive choroidal neovascularization: Secondary | ICD-10-CM | POA: Diagnosis not present

## 2015-10-14 DIAGNOSIS — H43813 Vitreous degeneration, bilateral: Secondary | ICD-10-CM | POA: Diagnosis not present

## 2015-11-25 DIAGNOSIS — H43813 Vitreous degeneration, bilateral: Secondary | ICD-10-CM | POA: Diagnosis not present

## 2015-11-25 DIAGNOSIS — H353222 Exudative age-related macular degeneration, left eye, with inactive choroidal neovascularization: Secondary | ICD-10-CM | POA: Diagnosis not present

## 2015-11-25 DIAGNOSIS — H35372 Puckering of macula, left eye: Secondary | ICD-10-CM | POA: Diagnosis not present

## 2015-11-25 DIAGNOSIS — H353211 Exudative age-related macular degeneration, right eye, with active choroidal neovascularization: Secondary | ICD-10-CM | POA: Diagnosis not present

## 2015-12-28 DIAGNOSIS — E782 Mixed hyperlipidemia: Secondary | ICD-10-CM | POA: Diagnosis not present

## 2015-12-28 DIAGNOSIS — Z6828 Body mass index (BMI) 28.0-28.9, adult: Secondary | ICD-10-CM | POA: Diagnosis not present

## 2015-12-28 DIAGNOSIS — Z1389 Encounter for screening for other disorder: Secondary | ICD-10-CM | POA: Diagnosis not present

## 2015-12-28 DIAGNOSIS — E119 Type 2 diabetes mellitus without complications: Secondary | ICD-10-CM | POA: Diagnosis not present

## 2015-12-28 DIAGNOSIS — Z0000 Encounter for general adult medical examination without abnormal findings: Secondary | ICD-10-CM | POA: Diagnosis not present

## 2015-12-28 DIAGNOSIS — E663 Overweight: Secondary | ICD-10-CM | POA: Diagnosis not present

## 2015-12-28 DIAGNOSIS — E1165 Type 2 diabetes mellitus with hyperglycemia: Secondary | ICD-10-CM | POA: Diagnosis not present

## 2015-12-28 DIAGNOSIS — I10 Essential (primary) hypertension: Secondary | ICD-10-CM | POA: Diagnosis not present

## 2015-12-28 DIAGNOSIS — R946 Abnormal results of thyroid function studies: Secondary | ICD-10-CM | POA: Diagnosis not present

## 2016-01-04 DIAGNOSIS — H353211 Exudative age-related macular degeneration, right eye, with active choroidal neovascularization: Secondary | ICD-10-CM | POA: Diagnosis not present

## 2016-01-04 DIAGNOSIS — H35372 Puckering of macula, left eye: Secondary | ICD-10-CM | POA: Diagnosis not present

## 2016-01-04 DIAGNOSIS — H353123 Nonexudative age-related macular degeneration, left eye, advanced atrophic without subfoveal involvement: Secondary | ICD-10-CM | POA: Diagnosis not present

## 2016-01-04 DIAGNOSIS — H43813 Vitreous degeneration, bilateral: Secondary | ICD-10-CM | POA: Diagnosis not present

## 2016-01-12 DIAGNOSIS — L570 Actinic keratosis: Secondary | ICD-10-CM | POA: Diagnosis not present

## 2016-01-12 DIAGNOSIS — D225 Melanocytic nevi of trunk: Secondary | ICD-10-CM | POA: Diagnosis not present

## 2016-01-12 DIAGNOSIS — L718 Other rosacea: Secondary | ICD-10-CM | POA: Diagnosis not present

## 2016-01-12 DIAGNOSIS — C44319 Basal cell carcinoma of skin of other parts of face: Secondary | ICD-10-CM | POA: Diagnosis not present

## 2016-01-12 DIAGNOSIS — X32XXXD Exposure to sunlight, subsequent encounter: Secondary | ICD-10-CM | POA: Diagnosis not present

## 2016-02-28 DIAGNOSIS — H43813 Vitreous degeneration, bilateral: Secondary | ICD-10-CM | POA: Diagnosis not present

## 2016-02-28 DIAGNOSIS — H353222 Exudative age-related macular degeneration, left eye, with inactive choroidal neovascularization: Secondary | ICD-10-CM | POA: Diagnosis not present

## 2016-02-28 DIAGNOSIS — H353211 Exudative age-related macular degeneration, right eye, with active choroidal neovascularization: Secondary | ICD-10-CM | POA: Diagnosis not present

## 2016-02-28 DIAGNOSIS — E113293 Type 2 diabetes mellitus with mild nonproliferative diabetic retinopathy without macular edema, bilateral: Secondary | ICD-10-CM | POA: Diagnosis not present

## 2016-05-01 DIAGNOSIS — H353211 Exudative age-related macular degeneration, right eye, with active choroidal neovascularization: Secondary | ICD-10-CM | POA: Diagnosis not present

## 2016-05-01 DIAGNOSIS — H43813 Vitreous degeneration, bilateral: Secondary | ICD-10-CM | POA: Diagnosis not present

## 2016-05-01 DIAGNOSIS — E113293 Type 2 diabetes mellitus with mild nonproliferative diabetic retinopathy without macular edema, bilateral: Secondary | ICD-10-CM | POA: Diagnosis not present

## 2016-05-01 DIAGNOSIS — H353222 Exudative age-related macular degeneration, left eye, with inactive choroidal neovascularization: Secondary | ICD-10-CM | POA: Diagnosis not present

## 2016-05-02 DIAGNOSIS — Z6828 Body mass index (BMI) 28.0-28.9, adult: Secondary | ICD-10-CM | POA: Diagnosis not present

## 2016-05-02 DIAGNOSIS — Z23 Encounter for immunization: Secondary | ICD-10-CM | POA: Diagnosis not present

## 2016-05-02 DIAGNOSIS — I10 Essential (primary) hypertension: Secondary | ICD-10-CM | POA: Diagnosis not present

## 2016-05-02 DIAGNOSIS — E663 Overweight: Secondary | ICD-10-CM | POA: Diagnosis not present

## 2016-05-02 DIAGNOSIS — E119 Type 2 diabetes mellitus without complications: Secondary | ICD-10-CM | POA: Diagnosis not present

## 2016-05-02 DIAGNOSIS — E782 Mixed hyperlipidemia: Secondary | ICD-10-CM | POA: Diagnosis not present

## 2016-07-10 DIAGNOSIS — H353211 Exudative age-related macular degeneration, right eye, with active choroidal neovascularization: Secondary | ICD-10-CM | POA: Diagnosis not present

## 2016-07-10 DIAGNOSIS — H43813 Vitreous degeneration, bilateral: Secondary | ICD-10-CM | POA: Diagnosis not present

## 2016-07-10 DIAGNOSIS — E113293 Type 2 diabetes mellitus with mild nonproliferative diabetic retinopathy without macular edema, bilateral: Secondary | ICD-10-CM | POA: Diagnosis not present

## 2016-07-10 DIAGNOSIS — H353222 Exudative age-related macular degeneration, left eye, with inactive choroidal neovascularization: Secondary | ICD-10-CM | POA: Diagnosis not present

## 2016-08-02 DIAGNOSIS — I10 Essential (primary) hypertension: Secondary | ICD-10-CM | POA: Diagnosis not present

## 2016-08-02 DIAGNOSIS — R946 Abnormal results of thyroid function studies: Secondary | ICD-10-CM | POA: Diagnosis not present

## 2016-08-02 DIAGNOSIS — E1165 Type 2 diabetes mellitus with hyperglycemia: Secondary | ICD-10-CM | POA: Diagnosis not present

## 2016-08-02 DIAGNOSIS — E782 Mixed hyperlipidemia: Secondary | ICD-10-CM | POA: Diagnosis not present

## 2016-08-02 DIAGNOSIS — Z1389 Encounter for screening for other disorder: Secondary | ICD-10-CM | POA: Diagnosis not present

## 2016-08-02 DIAGNOSIS — Z125 Encounter for screening for malignant neoplasm of prostate: Secondary | ICD-10-CM | POA: Diagnosis not present

## 2016-08-02 DIAGNOSIS — Z6828 Body mass index (BMI) 28.0-28.9, adult: Secondary | ICD-10-CM | POA: Diagnosis not present

## 2016-08-02 DIAGNOSIS — E663 Overweight: Secondary | ICD-10-CM | POA: Diagnosis not present

## 2016-09-18 DIAGNOSIS — H35372 Puckering of macula, left eye: Secondary | ICD-10-CM | POA: Diagnosis not present

## 2016-09-18 DIAGNOSIS — E113293 Type 2 diabetes mellitus with mild nonproliferative diabetic retinopathy without macular edema, bilateral: Secondary | ICD-10-CM | POA: Diagnosis not present

## 2016-09-18 DIAGNOSIS — H353211 Exudative age-related macular degeneration, right eye, with active choroidal neovascularization: Secondary | ICD-10-CM | POA: Diagnosis not present

## 2016-09-18 DIAGNOSIS — H353222 Exudative age-related macular degeneration, left eye, with inactive choroidal neovascularization: Secondary | ICD-10-CM | POA: Diagnosis not present

## 2016-09-27 DIAGNOSIS — E663 Overweight: Secondary | ICD-10-CM | POA: Diagnosis not present

## 2016-09-27 DIAGNOSIS — Z6828 Body mass index (BMI) 28.0-28.9, adult: Secondary | ICD-10-CM | POA: Diagnosis not present

## 2016-09-27 DIAGNOSIS — Z1389 Encounter for screening for other disorder: Secondary | ICD-10-CM | POA: Diagnosis not present

## 2016-09-27 DIAGNOSIS — M722 Plantar fascial fibromatosis: Secondary | ICD-10-CM | POA: Diagnosis not present

## 2016-12-11 DIAGNOSIS — E113293 Type 2 diabetes mellitus with mild nonproliferative diabetic retinopathy without macular edema, bilateral: Secondary | ICD-10-CM | POA: Diagnosis not present

## 2016-12-11 DIAGNOSIS — H35372 Puckering of macula, left eye: Secondary | ICD-10-CM | POA: Diagnosis not present

## 2016-12-11 DIAGNOSIS — H353211 Exudative age-related macular degeneration, right eye, with active choroidal neovascularization: Secondary | ICD-10-CM | POA: Diagnosis not present

## 2016-12-11 DIAGNOSIS — H353222 Exudative age-related macular degeneration, left eye, with inactive choroidal neovascularization: Secondary | ICD-10-CM | POA: Diagnosis not present

## 2017-01-07 DIAGNOSIS — Z1389 Encounter for screening for other disorder: Secondary | ICD-10-CM | POA: Diagnosis not present

## 2017-01-07 DIAGNOSIS — E1165 Type 2 diabetes mellitus with hyperglycemia: Secondary | ICD-10-CM | POA: Diagnosis not present

## 2017-01-07 DIAGNOSIS — R946 Abnormal results of thyroid function studies: Secondary | ICD-10-CM | POA: Diagnosis not present

## 2017-01-07 DIAGNOSIS — E663 Overweight: Secondary | ICD-10-CM | POA: Diagnosis not present

## 2017-01-07 DIAGNOSIS — Z6828 Body mass index (BMI) 28.0-28.9, adult: Secondary | ICD-10-CM | POA: Diagnosis not present

## 2017-01-07 DIAGNOSIS — E782 Mixed hyperlipidemia: Secondary | ICD-10-CM | POA: Diagnosis not present

## 2017-01-07 DIAGNOSIS — I10 Essential (primary) hypertension: Secondary | ICD-10-CM | POA: Diagnosis not present

## 2017-02-18 DIAGNOSIS — E039 Hypothyroidism, unspecified: Secondary | ICD-10-CM | POA: Diagnosis not present

## 2017-02-19 DIAGNOSIS — Z0000 Encounter for general adult medical examination without abnormal findings: Secondary | ICD-10-CM | POA: Diagnosis not present

## 2017-02-19 DIAGNOSIS — E663 Overweight: Secondary | ICD-10-CM | POA: Diagnosis not present

## 2017-02-19 DIAGNOSIS — Z6829 Body mass index (BMI) 29.0-29.9, adult: Secondary | ICD-10-CM | POA: Diagnosis not present

## 2017-03-04 DIAGNOSIS — R070 Pain in throat: Secondary | ICD-10-CM | POA: Diagnosis not present

## 2017-03-04 DIAGNOSIS — J302 Other seasonal allergic rhinitis: Secondary | ICD-10-CM | POA: Diagnosis not present

## 2017-03-04 DIAGNOSIS — I10 Essential (primary) hypertension: Secondary | ICD-10-CM | POA: Diagnosis not present

## 2017-03-04 DIAGNOSIS — J343 Hypertrophy of nasal turbinates: Secondary | ICD-10-CM | POA: Diagnosis not present

## 2017-03-04 DIAGNOSIS — E782 Mixed hyperlipidemia: Secondary | ICD-10-CM | POA: Diagnosis not present

## 2017-03-04 DIAGNOSIS — J209 Acute bronchitis, unspecified: Secondary | ICD-10-CM | POA: Diagnosis not present

## 2017-03-04 DIAGNOSIS — Z6829 Body mass index (BMI) 29.0-29.9, adult: Secondary | ICD-10-CM | POA: Diagnosis not present

## 2017-03-04 DIAGNOSIS — J069 Acute upper respiratory infection, unspecified: Secondary | ICD-10-CM | POA: Diagnosis not present

## 2017-03-04 DIAGNOSIS — Z1389 Encounter for screening for other disorder: Secondary | ICD-10-CM | POA: Diagnosis not present

## 2017-04-10 DIAGNOSIS — H353232 Exudative age-related macular degeneration, bilateral, with inactive choroidal neovascularization: Secondary | ICD-10-CM | POA: Diagnosis not present

## 2017-04-10 DIAGNOSIS — H43392 Other vitreous opacities, left eye: Secondary | ICD-10-CM | POA: Diagnosis not present

## 2017-04-10 DIAGNOSIS — E109 Type 1 diabetes mellitus without complications: Secondary | ICD-10-CM | POA: Diagnosis not present

## 2017-04-10 DIAGNOSIS — H43813 Vitreous degeneration, bilateral: Secondary | ICD-10-CM | POA: Diagnosis not present

## 2017-04-10 DIAGNOSIS — E113293 Type 2 diabetes mellitus with mild nonproliferative diabetic retinopathy without macular edema, bilateral: Secondary | ICD-10-CM | POA: Diagnosis not present

## 2017-05-13 DIAGNOSIS — E782 Mixed hyperlipidemia: Secondary | ICD-10-CM | POA: Diagnosis not present

## 2017-05-13 DIAGNOSIS — Z23 Encounter for immunization: Secondary | ICD-10-CM | POA: Diagnosis not present

## 2017-05-13 DIAGNOSIS — E039 Hypothyroidism, unspecified: Secondary | ICD-10-CM | POA: Diagnosis not present

## 2017-05-13 DIAGNOSIS — R946 Abnormal results of thyroid function studies: Secondary | ICD-10-CM | POA: Diagnosis not present

## 2017-05-13 DIAGNOSIS — E663 Overweight: Secondary | ICD-10-CM | POA: Diagnosis not present

## 2017-05-13 DIAGNOSIS — Z1389 Encounter for screening for other disorder: Secondary | ICD-10-CM | POA: Diagnosis not present

## 2017-05-13 DIAGNOSIS — I10 Essential (primary) hypertension: Secondary | ICD-10-CM | POA: Diagnosis not present

## 2017-05-13 DIAGNOSIS — E109 Type 1 diabetes mellitus without complications: Secondary | ICD-10-CM | POA: Diagnosis not present

## 2017-05-13 DIAGNOSIS — Z6829 Body mass index (BMI) 29.0-29.9, adult: Secondary | ICD-10-CM | POA: Diagnosis not present

## 2017-07-06 IMAGING — MR MR PROSTATE WO/W CM
23 of 53 series · 23 of 53 positions shown · IV contrast (multihance)
Comparison: None.

CLINICAL DATA: Elevated prostate specific antigen.  No biopsy.

EXAM:
MR PROSTATE WITHOUT AND WITH CONTRAST
TECHNIQUE: Multiplanar multisequence MRI images were obtained of the pelvis
centered about the prostate. Pre and post contrast images were
obtained.
CONTRAST:  20mL MULTIHANCE GADOBENATE DIMEGLUMINE 529 MG/ML IV SOLN

[Series 3: bSSFP fat-sat · axial · 6.0mm · 0.86mm/px · 1 of 44 slices shown]
[im 1/44]
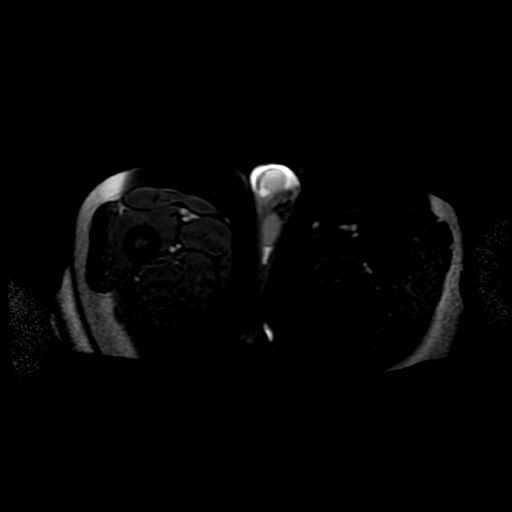

[Series 4: T1 · axial · 6.0mm · 0.86mm/px · 1 of 43 slices shown (1 of 2)]
[im 1/43]
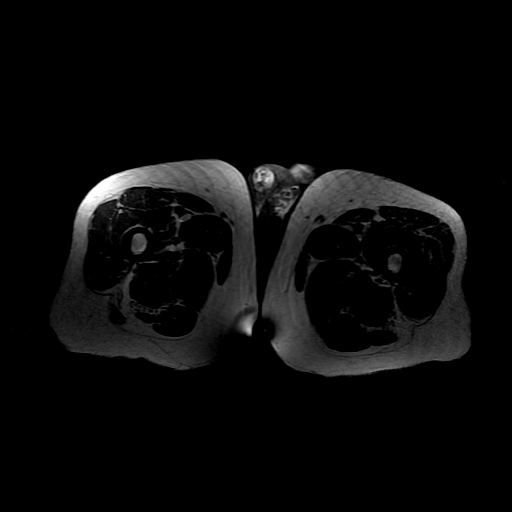

[Series 5: T2 · axial · 3.0mm · 0.29mm/px · 1 of 32 slices shown (1 of 3)]
[im 1/32]
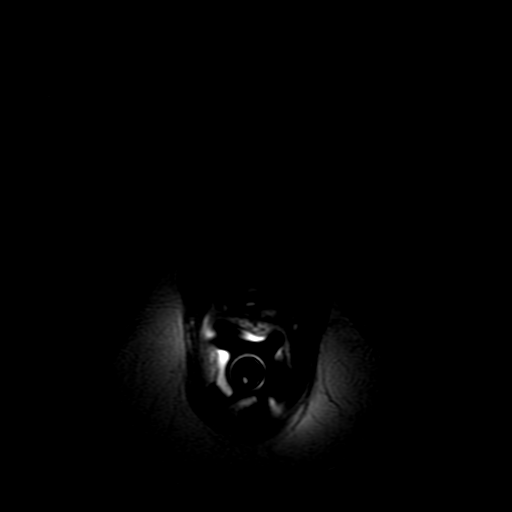

[Series 6: T1 · axial · 3.0mm · 0.29mm/px · 1 of 32 slices shown (2 of 2)]
[im 1/32]
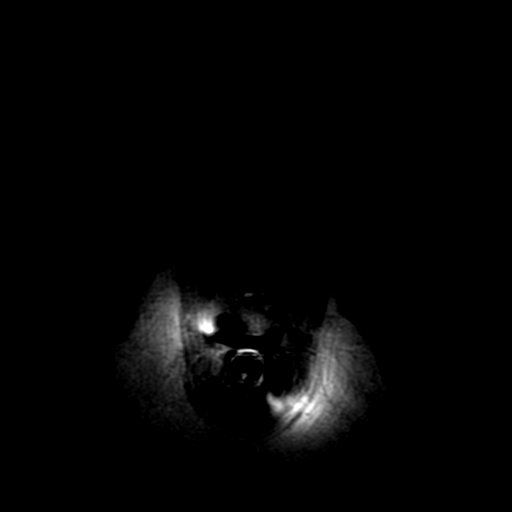

[Series 7: T2 · sagittal · 4.0mm · 0.29mm/px · 1 of 25 slices shown (2 of 3)]
[im 1/25]
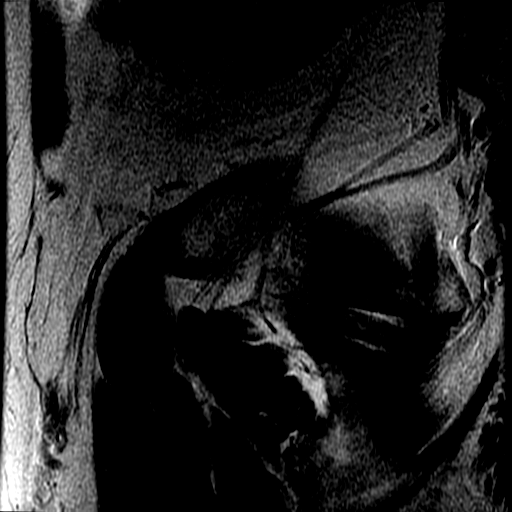

[Series 8: T2 · coronal · 4.0mm · 0.29mm/px · 1 of 18 slices shown (3 of 3)]
[im 1/18]
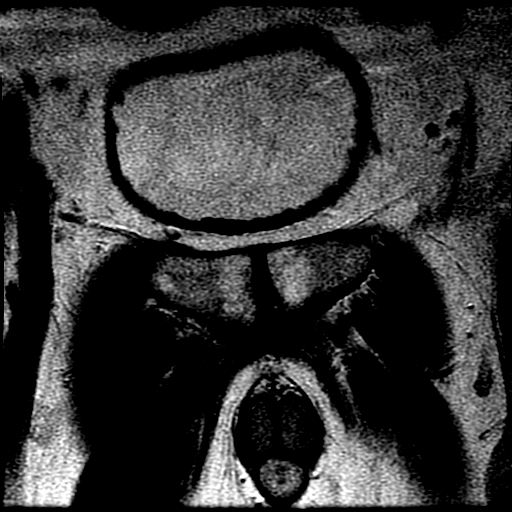

[Series 9: DWI · axial · 3.0mm · 0.59mm/px · 1 of 52 slices shown (1 of 6)]
[im 1/52]
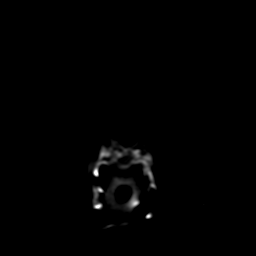

[Series 10: DWI · axial · 3.0mm · 0.59mm/px · 1 of 52 slices shown (2 of 6)]
[im 1/52]
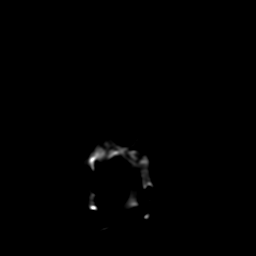

[Series 11: DWI · axial · 3.0mm · 0.59mm/px · 1 of 51 slices shown (3 of 6)]
[im 1/51]
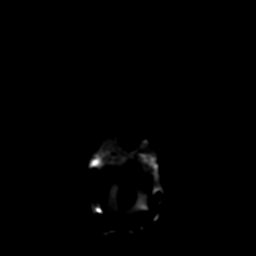

[Series 900: DWI · axial · 3.0mm · 0.59mm/px · 1 of 26 slices shown (4 of 6)]
[im 1/26]
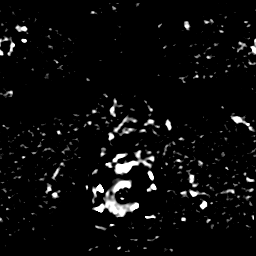

[Series 1000: DWI · axial · 3.0mm · 0.59mm/px · 1 of 26 slices shown (5 of 6)]
[im 1/26]
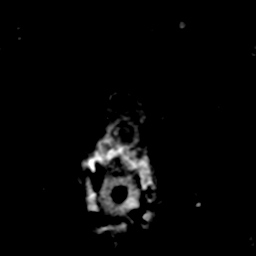

[Series 1100: DWI · axial · 3.0mm · 0.59mm/px · 1 of 26 slices shown (6 of 6)]
[im 1/26]
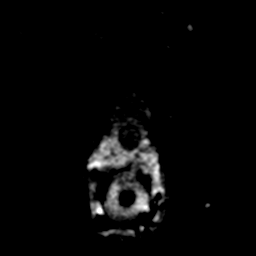

[((id)/(id)/1)-((id)/(id)/1) · axial · 3.0mm · 0.43mm/px · 1 of 76 slices shown (1 of 11)]
[im 1/76]
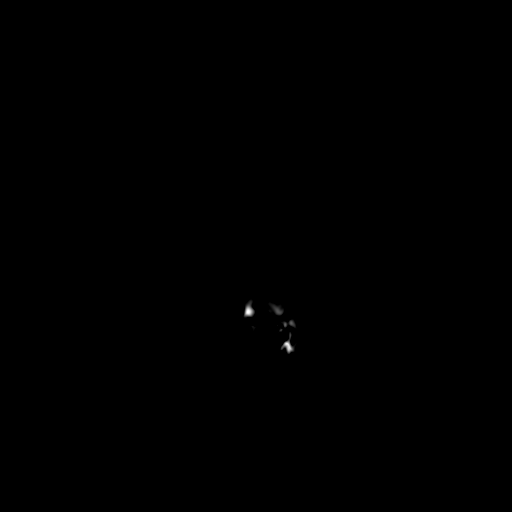

[((id)/(id)/1)-((id)/(id)/1) · axial · 3.0mm · 0.43mm/px · 1 of 76 slices shown (2 of 11)]
[im 1/76]
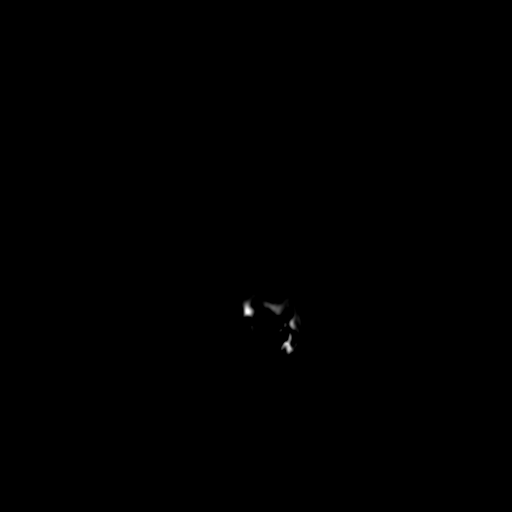

[((id)/(id)/1)-((id)/(id)/1) · axial · 3.0mm · 0.43mm/px · 1 of 76 slices shown (3 of 11)]
[im 1/76]
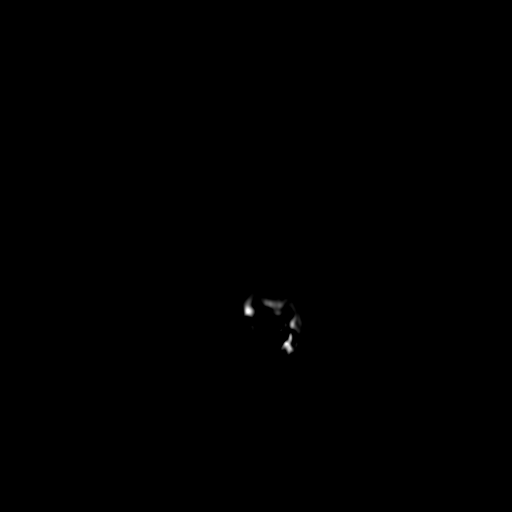

[((id)/(id)/1)-((id)/(id)/1) · axial · 3.0mm · 0.43mm/px · 1 of 76 slices shown (4 of 11)]
[im 1/76]
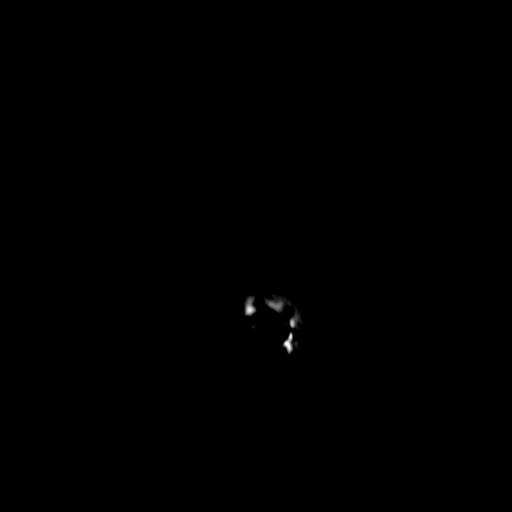

[((id)/(id)/1)-((id)/(id)/1) · axial · 3.0mm · 0.43mm/px · 1 of 76 slices shown (5 of 11)]
[im 1/76]
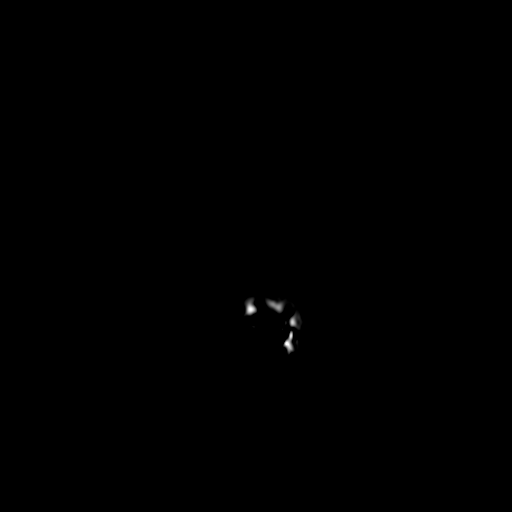

[((id)/(id)/1)-((id)/(id)/1) · axial · 3.0mm · 0.43mm/px · 1 of 76 slices shown (6 of 11)]
[im 1/76]
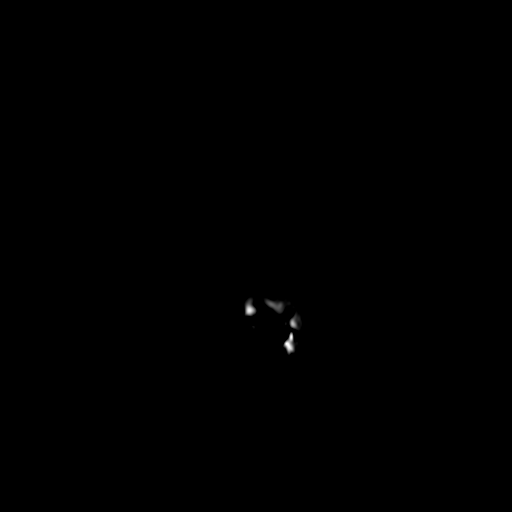

[((id)/(id)/1)-((id)/(id)/1) · axial · 3.0mm · 0.43mm/px · 1 of 76 slices shown (7 of 11)]
[im 1/76]
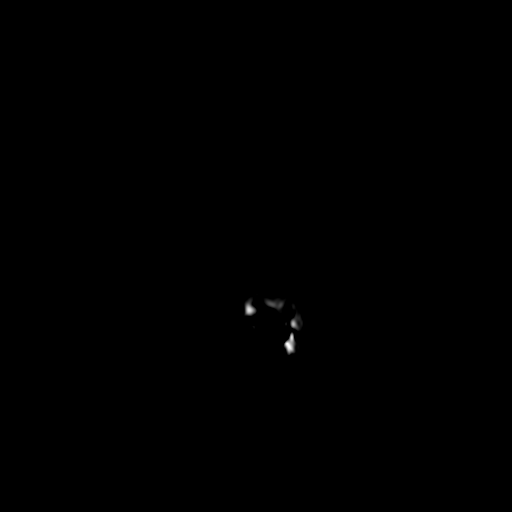

[((id)/(id)/1)-((id)/(id)/1) · axial · 3.0mm · 0.43mm/px · 1 of 76 slices shown (8 of 11)]
[im 1/76]
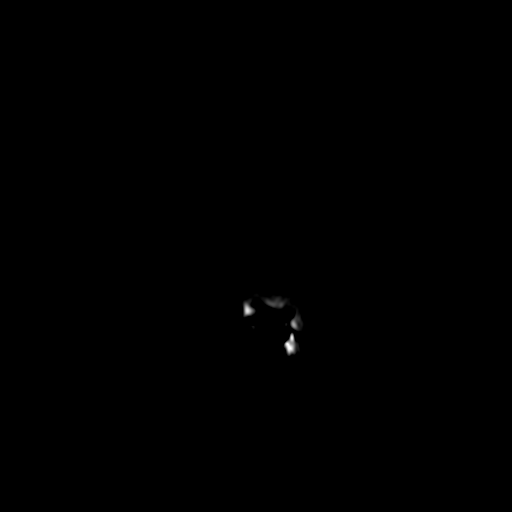

[((id)/(id)/1)-((id)/(id)/1) · axial · 3.0mm · 0.43mm/px · 1 of 76 slices shown (9 of 11)]
[im 1/76]
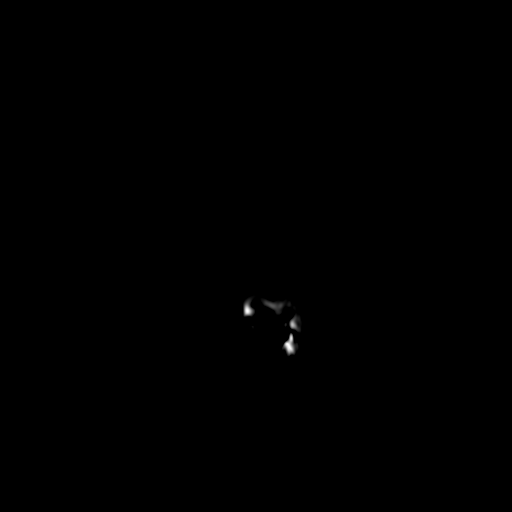

[((id)/(id)/1)-((id)/(id)/1) · axial · 3.0mm · 0.43mm/px · 1 of 76 slices shown (10 of 11)]
[im 1/76]
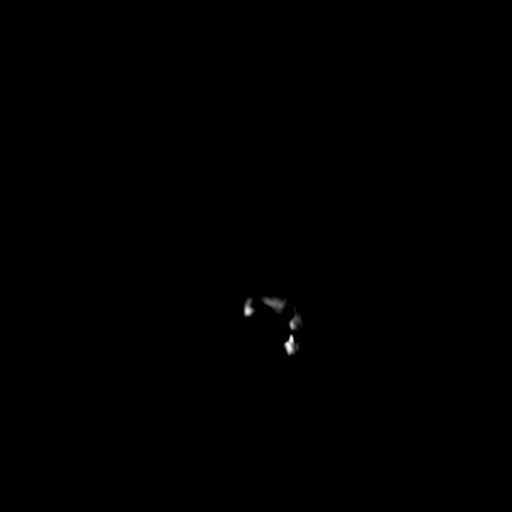

[((id)/(id)/1)-((id)/(id)/1) · axial · 3.0mm · 0.43mm/px · 1 of 76 slices shown (11 of 11)]
[im 1/76]
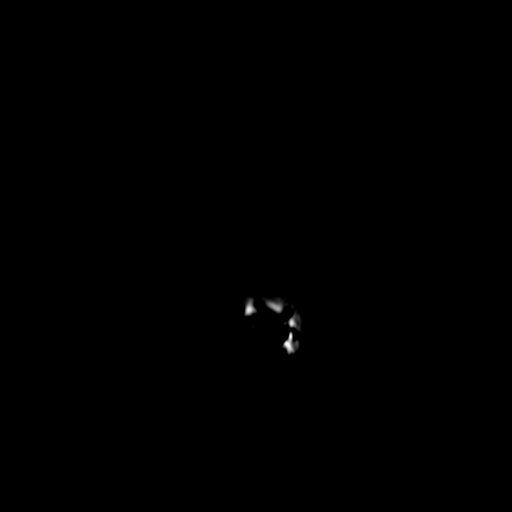

[23 of 53 positions shown; findings below may reference images not displayed]

FINDINGS: Prostate: The peripheral zone is thin. There is heterogeneous signal
intensity within the peripheral zone on the T2 weighted imaging.
There is no focal loss of signal intensity to localize carcinoma.
There is no restricted diffusion within the peripheral zone.

There are multiple nodules within the enlarged transitional zone.
These nodules are were circumscribed and have heterogeneous signal
on T2 weighted imaging which is typical of benign nodules.

Seminal vesicles are normal.  The prostatic capsule is intact.

Transcapsular spread:  Absent

Seminal vesicle involvement: Absent

Neurovascular bundle involvement: Absent

Pelvic adenopathy: Absent

Bone metastasis: Absent

Other findings: Prostate gland is enlarged measuring 50 mm x 61 mm x
60 mm. The prostate indents the trigone region of the bladder.

The bladder and rectum are normal. There are diverticula of the
sigmoid colon.
IMPRESSION: 1. No evidence of high-grade carcinoma within the of peripheral
zone.
2. Benign-appearing nodularity of the transitional zone.
3. Prostate hypertrophy.
4. Sigmoid diverticulosis.

## 2017-07-17 DIAGNOSIS — H35373 Puckering of macula, bilateral: Secondary | ICD-10-CM | POA: Diagnosis not present

## 2017-07-17 DIAGNOSIS — E113293 Type 2 diabetes mellitus with mild nonproliferative diabetic retinopathy without macular edema, bilateral: Secondary | ICD-10-CM | POA: Diagnosis not present

## 2017-07-17 DIAGNOSIS — H43813 Vitreous degeneration, bilateral: Secondary | ICD-10-CM | POA: Diagnosis not present

## 2017-07-17 DIAGNOSIS — E1165 Type 2 diabetes mellitus with hyperglycemia: Secondary | ICD-10-CM | POA: Diagnosis not present

## 2017-07-17 DIAGNOSIS — H43392 Other vitreous opacities, left eye: Secondary | ICD-10-CM | POA: Diagnosis not present

## 2017-07-19 DIAGNOSIS — H353212 Exudative age-related macular degeneration, right eye, with inactive choroidal neovascularization: Secondary | ICD-10-CM | POA: Diagnosis not present

## 2017-09-13 DIAGNOSIS — H35373 Puckering of macula, bilateral: Secondary | ICD-10-CM | POA: Diagnosis not present

## 2017-09-13 DIAGNOSIS — H353212 Exudative age-related macular degeneration, right eye, with inactive choroidal neovascularization: Secondary | ICD-10-CM | POA: Diagnosis not present

## 2017-09-13 DIAGNOSIS — H353221 Exudative age-related macular degeneration, left eye, with active choroidal neovascularization: Secondary | ICD-10-CM | POA: Diagnosis not present

## 2017-09-13 DIAGNOSIS — E113293 Type 2 diabetes mellitus with mild nonproliferative diabetic retinopathy without macular edema, bilateral: Secondary | ICD-10-CM | POA: Diagnosis not present

## 2017-09-18 DIAGNOSIS — E109 Type 1 diabetes mellitus without complications: Secondary | ICD-10-CM | POA: Diagnosis not present

## 2017-09-18 DIAGNOSIS — Z6829 Body mass index (BMI) 29.0-29.9, adult: Secondary | ICD-10-CM | POA: Diagnosis not present

## 2017-09-18 DIAGNOSIS — I10 Essential (primary) hypertension: Secondary | ICD-10-CM | POA: Diagnosis not present

## 2017-09-18 DIAGNOSIS — E782 Mixed hyperlipidemia: Secondary | ICD-10-CM | POA: Diagnosis not present

## 2017-09-19 DIAGNOSIS — E1165 Type 2 diabetes mellitus with hyperglycemia: Secondary | ICD-10-CM | POA: Diagnosis not present

## 2017-09-19 DIAGNOSIS — E119 Type 2 diabetes mellitus without complications: Secondary | ICD-10-CM | POA: Diagnosis not present

## 2017-11-15 DIAGNOSIS — H353212 Exudative age-related macular degeneration, right eye, with inactive choroidal neovascularization: Secondary | ICD-10-CM | POA: Diagnosis not present

## 2017-11-15 DIAGNOSIS — E113293 Type 2 diabetes mellitus with mild nonproliferative diabetic retinopathy without macular edema, bilateral: Secondary | ICD-10-CM | POA: Diagnosis not present

## 2017-11-15 DIAGNOSIS — H35373 Puckering of macula, bilateral: Secondary | ICD-10-CM | POA: Diagnosis not present

## 2017-11-15 DIAGNOSIS — H353221 Exudative age-related macular degeneration, left eye, with active choroidal neovascularization: Secondary | ICD-10-CM | POA: Diagnosis not present

## 2017-11-18 DIAGNOSIS — Z23 Encounter for immunization: Secondary | ICD-10-CM | POA: Diagnosis not present

## 2017-12-19 DIAGNOSIS — E039 Hypothyroidism, unspecified: Secondary | ICD-10-CM | POA: Diagnosis not present

## 2017-12-19 DIAGNOSIS — Z1389 Encounter for screening for other disorder: Secondary | ICD-10-CM | POA: Diagnosis not present

## 2017-12-19 DIAGNOSIS — E782 Mixed hyperlipidemia: Secondary | ICD-10-CM | POA: Diagnosis not present

## 2017-12-19 DIAGNOSIS — E663 Overweight: Secondary | ICD-10-CM | POA: Diagnosis not present

## 2017-12-19 DIAGNOSIS — Z6829 Body mass index (BMI) 29.0-29.9, adult: Secondary | ICD-10-CM | POA: Diagnosis not present

## 2017-12-19 DIAGNOSIS — E109 Type 1 diabetes mellitus without complications: Secondary | ICD-10-CM | POA: Diagnosis not present

## 2018-01-17 DIAGNOSIS — H35373 Puckering of macula, bilateral: Secondary | ICD-10-CM | POA: Diagnosis not present

## 2018-01-17 DIAGNOSIS — H353221 Exudative age-related macular degeneration, left eye, with active choroidal neovascularization: Secondary | ICD-10-CM | POA: Diagnosis not present

## 2018-01-17 DIAGNOSIS — H353212 Exudative age-related macular degeneration, right eye, with inactive choroidal neovascularization: Secondary | ICD-10-CM | POA: Diagnosis not present

## 2018-01-17 DIAGNOSIS — E113293 Type 2 diabetes mellitus with mild nonproliferative diabetic retinopathy without macular edema, bilateral: Secondary | ICD-10-CM | POA: Diagnosis not present

## 2018-02-24 DIAGNOSIS — Z0001 Encounter for general adult medical examination with abnormal findings: Secondary | ICD-10-CM | POA: Diagnosis not present

## 2018-02-24 DIAGNOSIS — Z1389 Encounter for screening for other disorder: Secondary | ICD-10-CM | POA: Diagnosis not present

## 2018-02-24 DIAGNOSIS — Z6829 Body mass index (BMI) 29.0-29.9, adult: Secondary | ICD-10-CM | POA: Diagnosis not present

## 2018-03-19 DIAGNOSIS — H353212 Exudative age-related macular degeneration, right eye, with inactive choroidal neovascularization: Secondary | ICD-10-CM | POA: Diagnosis not present

## 2018-03-19 DIAGNOSIS — H43813 Vitreous degeneration, bilateral: Secondary | ICD-10-CM | POA: Diagnosis not present

## 2018-03-19 DIAGNOSIS — H353221 Exudative age-related macular degeneration, left eye, with active choroidal neovascularization: Secondary | ICD-10-CM | POA: Diagnosis not present

## 2018-03-19 DIAGNOSIS — H43392 Other vitreous opacities, left eye: Secondary | ICD-10-CM | POA: Diagnosis not present

## 2018-05-14 DIAGNOSIS — H353212 Exudative age-related macular degeneration, right eye, with inactive choroidal neovascularization: Secondary | ICD-10-CM | POA: Diagnosis not present

## 2018-05-14 DIAGNOSIS — E113293 Type 2 diabetes mellitus with mild nonproliferative diabetic retinopathy without macular edema, bilateral: Secondary | ICD-10-CM | POA: Diagnosis not present

## 2018-05-14 DIAGNOSIS — H35371 Puckering of macula, right eye: Secondary | ICD-10-CM | POA: Diagnosis not present

## 2018-05-14 DIAGNOSIS — H353221 Exudative age-related macular degeneration, left eye, with active choroidal neovascularization: Secondary | ICD-10-CM | POA: Diagnosis not present

## 2018-07-09 DIAGNOSIS — E113293 Type 2 diabetes mellitus with mild nonproliferative diabetic retinopathy without macular edema, bilateral: Secondary | ICD-10-CM | POA: Diagnosis not present

## 2018-07-09 DIAGNOSIS — H353221 Exudative age-related macular degeneration, left eye, with active choroidal neovascularization: Secondary | ICD-10-CM | POA: Diagnosis not present

## 2018-07-09 DIAGNOSIS — H353212 Exudative age-related macular degeneration, right eye, with inactive choroidal neovascularization: Secondary | ICD-10-CM | POA: Diagnosis not present

## 2018-07-09 DIAGNOSIS — H35371 Puckering of macula, right eye: Secondary | ICD-10-CM | POA: Diagnosis not present

## 2018-08-04 DIAGNOSIS — R946 Abnormal results of thyroid function studies: Secondary | ICD-10-CM | POA: Diagnosis not present

## 2018-08-04 DIAGNOSIS — Z23 Encounter for immunization: Secondary | ICD-10-CM | POA: Diagnosis not present

## 2018-08-04 DIAGNOSIS — E109 Type 1 diabetes mellitus without complications: Secondary | ICD-10-CM | POA: Diagnosis not present

## 2018-08-04 DIAGNOSIS — Z125 Encounter for screening for malignant neoplasm of prostate: Secondary | ICD-10-CM | POA: Diagnosis not present

## 2018-08-04 DIAGNOSIS — Z6829 Body mass index (BMI) 29.0-29.9, adult: Secondary | ICD-10-CM | POA: Diagnosis not present

## 2018-08-04 DIAGNOSIS — Z1389 Encounter for screening for other disorder: Secondary | ICD-10-CM | POA: Diagnosis not present

## 2018-08-04 DIAGNOSIS — E039 Hypothyroidism, unspecified: Secondary | ICD-10-CM | POA: Diagnosis not present

## 2018-08-04 DIAGNOSIS — E663 Overweight: Secondary | ICD-10-CM | POA: Diagnosis not present

## 2018-08-04 DIAGNOSIS — I10 Essential (primary) hypertension: Secondary | ICD-10-CM | POA: Diagnosis not present

## 2018-09-03 DIAGNOSIS — H353212 Exudative age-related macular degeneration, right eye, with inactive choroidal neovascularization: Secondary | ICD-10-CM | POA: Diagnosis not present

## 2018-09-03 DIAGNOSIS — E113293 Type 2 diabetes mellitus with mild nonproliferative diabetic retinopathy without macular edema, bilateral: Secondary | ICD-10-CM | POA: Diagnosis not present

## 2018-09-03 DIAGNOSIS — H35371 Puckering of macula, right eye: Secondary | ICD-10-CM | POA: Diagnosis not present

## 2018-09-03 DIAGNOSIS — H353221 Exudative age-related macular degeneration, left eye, with active choroidal neovascularization: Secondary | ICD-10-CM | POA: Diagnosis not present

## 2018-10-28 DIAGNOSIS — H35371 Puckering of macula, right eye: Secondary | ICD-10-CM | POA: Diagnosis not present

## 2018-10-28 DIAGNOSIS — H353231 Exudative age-related macular degeneration, bilateral, with active choroidal neovascularization: Secondary | ICD-10-CM | POA: Diagnosis not present

## 2018-10-28 DIAGNOSIS — E1165 Type 2 diabetes mellitus with hyperglycemia: Secondary | ICD-10-CM | POA: Diagnosis not present

## 2018-10-28 DIAGNOSIS — H43813 Vitreous degeneration, bilateral: Secondary | ICD-10-CM | POA: Diagnosis not present

## 2018-10-28 DIAGNOSIS — E113293 Type 2 diabetes mellitus with mild nonproliferative diabetic retinopathy without macular edema, bilateral: Secondary | ICD-10-CM | POA: Diagnosis not present

## 2018-12-23 DIAGNOSIS — H353231 Exudative age-related macular degeneration, bilateral, with active choroidal neovascularization: Secondary | ICD-10-CM | POA: Diagnosis not present

## 2019-01-19 DIAGNOSIS — E039 Hypothyroidism, unspecified: Secondary | ICD-10-CM | POA: Diagnosis not present

## 2019-01-19 DIAGNOSIS — E109 Type 1 diabetes mellitus without complications: Secondary | ICD-10-CM | POA: Diagnosis not present

## 2019-01-19 DIAGNOSIS — Z6829 Body mass index (BMI) 29.0-29.9, adult: Secondary | ICD-10-CM | POA: Diagnosis not present

## 2019-01-19 DIAGNOSIS — E7849 Other hyperlipidemia: Secondary | ICD-10-CM | POA: Diagnosis not present

## 2019-01-19 DIAGNOSIS — E663 Overweight: Secondary | ICD-10-CM | POA: Diagnosis not present

## 2019-01-19 DIAGNOSIS — I10 Essential (primary) hypertension: Secondary | ICD-10-CM | POA: Diagnosis not present

## 2019-01-19 DIAGNOSIS — Z1389 Encounter for screening for other disorder: Secondary | ICD-10-CM | POA: Diagnosis not present

## 2019-02-24 DIAGNOSIS — H353231 Exudative age-related macular degeneration, bilateral, with active choroidal neovascularization: Secondary | ICD-10-CM | POA: Diagnosis not present

## 2019-02-24 DIAGNOSIS — H43392 Other vitreous opacities, left eye: Secondary | ICD-10-CM | POA: Diagnosis not present

## 2019-02-24 DIAGNOSIS — E113293 Type 2 diabetes mellitus with mild nonproliferative diabetic retinopathy without macular edema, bilateral: Secondary | ICD-10-CM | POA: Diagnosis not present

## 2019-02-24 DIAGNOSIS — H43813 Vitreous degeneration, bilateral: Secondary | ICD-10-CM | POA: Diagnosis not present

## 2019-04-13 DIAGNOSIS — H43813 Vitreous degeneration, bilateral: Secondary | ICD-10-CM | POA: Diagnosis not present

## 2019-04-13 DIAGNOSIS — H353231 Exudative age-related macular degeneration, bilateral, with active choroidal neovascularization: Secondary | ICD-10-CM | POA: Diagnosis not present

## 2019-04-13 DIAGNOSIS — H43392 Other vitreous opacities, left eye: Secondary | ICD-10-CM | POA: Diagnosis not present

## 2019-04-13 DIAGNOSIS — E113293 Type 2 diabetes mellitus with mild nonproliferative diabetic retinopathy without macular edema, bilateral: Secondary | ICD-10-CM | POA: Diagnosis not present

## 2019-04-14 DIAGNOSIS — H3561 Retinal hemorrhage, right eye: Secondary | ICD-10-CM | POA: Diagnosis not present

## 2019-04-14 DIAGNOSIS — E119 Type 2 diabetes mellitus without complications: Secondary | ICD-10-CM | POA: Diagnosis not present

## 2019-04-14 DIAGNOSIS — H353231 Exudative age-related macular degeneration, bilateral, with active choroidal neovascularization: Secondary | ICD-10-CM | POA: Diagnosis not present

## 2019-04-14 DIAGNOSIS — Z794 Long term (current) use of insulin: Secondary | ICD-10-CM | POA: Diagnosis not present

## 2019-04-15 DIAGNOSIS — H353231 Exudative age-related macular degeneration, bilateral, with active choroidal neovascularization: Secondary | ICD-10-CM | POA: Diagnosis not present

## 2019-04-15 DIAGNOSIS — K219 Gastro-esophageal reflux disease without esophagitis: Secondary | ICD-10-CM | POA: Diagnosis not present

## 2019-04-15 DIAGNOSIS — H3561 Retinal hemorrhage, right eye: Secondary | ICD-10-CM | POA: Diagnosis not present

## 2019-04-15 DIAGNOSIS — Z79899 Other long term (current) drug therapy: Secondary | ICD-10-CM | POA: Diagnosis not present

## 2019-04-15 DIAGNOSIS — Z794 Long term (current) use of insulin: Secondary | ICD-10-CM | POA: Diagnosis not present

## 2019-04-15 DIAGNOSIS — H353210 Exudative age-related macular degeneration, right eye, stage unspecified: Secondary | ICD-10-CM | POA: Diagnosis not present

## 2019-04-15 DIAGNOSIS — Z961 Presence of intraocular lens: Secondary | ICD-10-CM | POA: Diagnosis not present

## 2019-04-15 DIAGNOSIS — N400 Enlarged prostate without lower urinary tract symptoms: Secondary | ICD-10-CM | POA: Diagnosis not present

## 2019-04-15 DIAGNOSIS — E119 Type 2 diabetes mellitus without complications: Secondary | ICD-10-CM | POA: Diagnosis not present

## 2019-04-15 NOTE — H&P (Signed)
 The H&P has been reviewed and the patient has been examined. There is no change in the overall ophthalmic assessment and no ophthalmic contraindication for surgery.

## 2019-04-21 DIAGNOSIS — H3561 Retinal hemorrhage, right eye: Secondary | ICD-10-CM | POA: Diagnosis not present

## 2019-05-05 DIAGNOSIS — H353231 Exudative age-related macular degeneration, bilateral, with active choroidal neovascularization: Secondary | ICD-10-CM | POA: Diagnosis not present

## 2019-05-05 DIAGNOSIS — H3561 Retinal hemorrhage, right eye: Secondary | ICD-10-CM | POA: Diagnosis not present

## 2019-05-05 DIAGNOSIS — Z794 Long term (current) use of insulin: Secondary | ICD-10-CM | POA: Diagnosis not present

## 2019-05-05 DIAGNOSIS — E119 Type 2 diabetes mellitus without complications: Secondary | ICD-10-CM | POA: Diagnosis not present

## 2019-05-05 DIAGNOSIS — Z23 Encounter for immunization: Secondary | ICD-10-CM | POA: Diagnosis not present

## 2019-05-13 DIAGNOSIS — E113293 Type 2 diabetes mellitus with mild nonproliferative diabetic retinopathy without macular edema, bilateral: Secondary | ICD-10-CM | POA: Diagnosis not present

## 2019-05-13 DIAGNOSIS — H35372 Puckering of macula, left eye: Secondary | ICD-10-CM | POA: Diagnosis not present

## 2019-05-13 DIAGNOSIS — H43812 Vitreous degeneration, left eye: Secondary | ICD-10-CM | POA: Diagnosis not present

## 2019-05-13 DIAGNOSIS — H353231 Exudative age-related macular degeneration, bilateral, with active choroidal neovascularization: Secondary | ICD-10-CM | POA: Diagnosis not present

## 2019-06-10 DIAGNOSIS — H43812 Vitreous degeneration, left eye: Secondary | ICD-10-CM | POA: Diagnosis not present

## 2019-06-10 DIAGNOSIS — H43392 Other vitreous opacities, left eye: Secondary | ICD-10-CM | POA: Diagnosis not present

## 2019-06-10 DIAGNOSIS — E113293 Type 2 diabetes mellitus with mild nonproliferative diabetic retinopathy without macular edema, bilateral: Secondary | ICD-10-CM | POA: Diagnosis not present

## 2019-06-10 DIAGNOSIS — H353231 Exudative age-related macular degeneration, bilateral, with active choroidal neovascularization: Secondary | ICD-10-CM | POA: Diagnosis not present

## 2019-07-20 DIAGNOSIS — E1165 Type 2 diabetes mellitus with hyperglycemia: Secondary | ICD-10-CM | POA: Diagnosis not present

## 2019-07-20 DIAGNOSIS — I10 Essential (primary) hypertension: Secondary | ICD-10-CM | POA: Diagnosis not present

## 2019-07-20 DIAGNOSIS — E7849 Other hyperlipidemia: Secondary | ICD-10-CM | POA: Diagnosis not present

## 2019-07-22 DIAGNOSIS — E663 Overweight: Secondary | ICD-10-CM | POA: Diagnosis not present

## 2019-07-22 DIAGNOSIS — H43812 Vitreous degeneration, left eye: Secondary | ICD-10-CM | POA: Diagnosis not present

## 2019-07-22 DIAGNOSIS — E039 Hypothyroidism, unspecified: Secondary | ICD-10-CM | POA: Diagnosis not present

## 2019-07-22 DIAGNOSIS — E7849 Other hyperlipidemia: Secondary | ICD-10-CM | POA: Diagnosis not present

## 2019-07-22 DIAGNOSIS — H353231 Exudative age-related macular degeneration, bilateral, with active choroidal neovascularization: Secondary | ICD-10-CM | POA: Diagnosis not present

## 2019-07-22 DIAGNOSIS — I10 Essential (primary) hypertension: Secondary | ICD-10-CM | POA: Diagnosis not present

## 2019-07-22 DIAGNOSIS — Z6829 Body mass index (BMI) 29.0-29.9, adult: Secondary | ICD-10-CM | POA: Diagnosis not present

## 2019-07-22 DIAGNOSIS — Z1389 Encounter for screening for other disorder: Secondary | ICD-10-CM | POA: Diagnosis not present

## 2019-07-22 DIAGNOSIS — Z0001 Encounter for general adult medical examination with abnormal findings: Secondary | ICD-10-CM | POA: Diagnosis not present

## 2019-07-22 DIAGNOSIS — E113293 Type 2 diabetes mellitus with mild nonproliferative diabetic retinopathy without macular edema, bilateral: Secondary | ICD-10-CM | POA: Diagnosis not present

## 2019-07-22 DIAGNOSIS — H43392 Other vitreous opacities, left eye: Secondary | ICD-10-CM | POA: Diagnosis not present

## 2019-07-22 DIAGNOSIS — E1165 Type 2 diabetes mellitus with hyperglycemia: Secondary | ICD-10-CM | POA: Diagnosis not present

## 2019-09-25 DIAGNOSIS — Z23 Encounter for immunization: Secondary | ICD-10-CM | POA: Diagnosis not present

## 2019-10-18 DIAGNOSIS — E039 Hypothyroidism, unspecified: Secondary | ICD-10-CM | POA: Diagnosis not present

## 2019-10-18 DIAGNOSIS — I10 Essential (primary) hypertension: Secondary | ICD-10-CM | POA: Diagnosis not present

## 2019-10-18 DIAGNOSIS — E1165 Type 2 diabetes mellitus with hyperglycemia: Secondary | ICD-10-CM | POA: Diagnosis not present

## 2019-10-18 DIAGNOSIS — E7849 Other hyperlipidemia: Secondary | ICD-10-CM | POA: Diagnosis not present

## 2019-10-24 DIAGNOSIS — Z23 Encounter for immunization: Secondary | ICD-10-CM | POA: Diagnosis not present

## 2019-10-27 DIAGNOSIS — H43392 Other vitreous opacities, left eye: Secondary | ICD-10-CM | POA: Diagnosis not present

## 2019-10-27 DIAGNOSIS — E113293 Type 2 diabetes mellitus with mild nonproliferative diabetic retinopathy without macular edema, bilateral: Secondary | ICD-10-CM | POA: Diagnosis not present

## 2019-10-27 DIAGNOSIS — H353231 Exudative age-related macular degeneration, bilateral, with active choroidal neovascularization: Secondary | ICD-10-CM | POA: Diagnosis not present

## 2019-10-27 DIAGNOSIS — H43812 Vitreous degeneration, left eye: Secondary | ICD-10-CM | POA: Diagnosis not present

## 2019-11-30 DIAGNOSIS — Z23 Encounter for immunization: Secondary | ICD-10-CM | POA: Diagnosis not present

## 2019-11-30 DIAGNOSIS — Z6829 Body mass index (BMI) 29.0-29.9, adult: Secondary | ICD-10-CM | POA: Diagnosis not present

## 2019-11-30 DIAGNOSIS — I10 Essential (primary) hypertension: Secondary | ICD-10-CM | POA: Diagnosis not present

## 2019-11-30 DIAGNOSIS — E7849 Other hyperlipidemia: Secondary | ICD-10-CM | POA: Diagnosis not present

## 2019-11-30 DIAGNOSIS — E039 Hypothyroidism, unspecified: Secondary | ICD-10-CM | POA: Diagnosis not present

## 2019-11-30 DIAGNOSIS — E663 Overweight: Secondary | ICD-10-CM | POA: Diagnosis not present

## 2019-11-30 DIAGNOSIS — Z1389 Encounter for screening for other disorder: Secondary | ICD-10-CM | POA: Diagnosis not present

## 2019-11-30 DIAGNOSIS — E119 Type 2 diabetes mellitus without complications: Secondary | ICD-10-CM | POA: Diagnosis not present

## 2019-12-22 DIAGNOSIS — H43392 Other vitreous opacities, left eye: Secondary | ICD-10-CM | POA: Diagnosis not present

## 2019-12-22 DIAGNOSIS — E113293 Type 2 diabetes mellitus with mild nonproliferative diabetic retinopathy without macular edema, bilateral: Secondary | ICD-10-CM | POA: Diagnosis not present

## 2019-12-22 DIAGNOSIS — H353231 Exudative age-related macular degeneration, bilateral, with active choroidal neovascularization: Secondary | ICD-10-CM | POA: Diagnosis not present

## 2019-12-22 DIAGNOSIS — H43812 Vitreous degeneration, left eye: Secondary | ICD-10-CM | POA: Diagnosis not present

## 2020-04-13 DIAGNOSIS — H43392 Other vitreous opacities, left eye: Secondary | ICD-10-CM | POA: Diagnosis not present

## 2020-04-13 DIAGNOSIS — H43812 Vitreous degeneration, left eye: Secondary | ICD-10-CM | POA: Diagnosis not present

## 2020-04-13 DIAGNOSIS — H353231 Exudative age-related macular degeneration, bilateral, with active choroidal neovascularization: Secondary | ICD-10-CM | POA: Diagnosis not present

## 2020-04-13 DIAGNOSIS — E113293 Type 2 diabetes mellitus with mild nonproliferative diabetic retinopathy without macular edema, bilateral: Secondary | ICD-10-CM | POA: Diagnosis not present

## 2020-04-19 DIAGNOSIS — E039 Hypothyroidism, unspecified: Secondary | ICD-10-CM | POA: Diagnosis not present

## 2020-04-19 DIAGNOSIS — E1165 Type 2 diabetes mellitus with hyperglycemia: Secondary | ICD-10-CM | POA: Diagnosis not present

## 2020-04-19 DIAGNOSIS — I10 Essential (primary) hypertension: Secondary | ICD-10-CM | POA: Diagnosis not present

## 2020-04-19 DIAGNOSIS — E7849 Other hyperlipidemia: Secondary | ICD-10-CM | POA: Diagnosis not present

## 2020-05-09 DIAGNOSIS — Z23 Encounter for immunization: Secondary | ICD-10-CM | POA: Diagnosis not present

## 2020-05-09 DIAGNOSIS — Z6829 Body mass index (BMI) 29.0-29.9, adult: Secondary | ICD-10-CM | POA: Diagnosis not present

## 2020-05-09 DIAGNOSIS — E109 Type 1 diabetes mellitus without complications: Secondary | ICD-10-CM | POA: Diagnosis not present

## 2020-05-09 DIAGNOSIS — E039 Hypothyroidism, unspecified: Secondary | ICD-10-CM | POA: Diagnosis not present

## 2020-05-09 DIAGNOSIS — I10 Essential (primary) hypertension: Secondary | ICD-10-CM | POA: Diagnosis not present

## 2020-05-09 DIAGNOSIS — E663 Overweight: Secondary | ICD-10-CM | POA: Diagnosis not present

## 2020-06-08 DIAGNOSIS — H353231 Exudative age-related macular degeneration, bilateral, with active choroidal neovascularization: Secondary | ICD-10-CM | POA: Diagnosis not present

## 2020-06-08 DIAGNOSIS — H43812 Vitreous degeneration, left eye: Secondary | ICD-10-CM | POA: Diagnosis not present

## 2020-06-08 DIAGNOSIS — H43392 Other vitreous opacities, left eye: Secondary | ICD-10-CM | POA: Diagnosis not present

## 2020-06-08 DIAGNOSIS — E113293 Type 2 diabetes mellitus with mild nonproliferative diabetic retinopathy without macular edema, bilateral: Secondary | ICD-10-CM | POA: Diagnosis not present

## 2020-06-18 DIAGNOSIS — E1165 Type 2 diabetes mellitus with hyperglycemia: Secondary | ICD-10-CM | POA: Diagnosis not present

## 2020-06-18 DIAGNOSIS — E7849 Other hyperlipidemia: Secondary | ICD-10-CM | POA: Diagnosis not present

## 2020-06-18 DIAGNOSIS — I10 Essential (primary) hypertension: Secondary | ICD-10-CM | POA: Diagnosis not present

## 2020-06-21 DIAGNOSIS — Z23 Encounter for immunization: Secondary | ICD-10-CM | POA: Diagnosis not present

## 2020-07-19 DIAGNOSIS — E1165 Type 2 diabetes mellitus with hyperglycemia: Secondary | ICD-10-CM | POA: Diagnosis not present

## 2020-07-19 DIAGNOSIS — E7849 Other hyperlipidemia: Secondary | ICD-10-CM | POA: Diagnosis not present

## 2020-07-19 DIAGNOSIS — I10 Essential (primary) hypertension: Secondary | ICD-10-CM | POA: Diagnosis not present

## 2020-07-25 DIAGNOSIS — D044 Carcinoma in situ of skin of scalp and neck: Secondary | ICD-10-CM | POA: Diagnosis not present

## 2020-07-25 DIAGNOSIS — L308 Other specified dermatitis: Secondary | ICD-10-CM | POA: Diagnosis not present

## 2020-07-25 DIAGNOSIS — L570 Actinic keratosis: Secondary | ICD-10-CM | POA: Diagnosis not present

## 2020-07-25 DIAGNOSIS — X32XXXA Exposure to sunlight, initial encounter: Secondary | ICD-10-CM | POA: Diagnosis not present

## 2020-08-03 DIAGNOSIS — H3561 Retinal hemorrhage, right eye: Secondary | ICD-10-CM | POA: Diagnosis not present

## 2020-08-03 DIAGNOSIS — H353231 Exudative age-related macular degeneration, bilateral, with active choroidal neovascularization: Secondary | ICD-10-CM | POA: Diagnosis not present

## 2020-08-04 DIAGNOSIS — H3561 Retinal hemorrhage, right eye: Secondary | ICD-10-CM | POA: Diagnosis not present

## 2020-08-04 DIAGNOSIS — H35051 Retinal neovascularization, unspecified, right eye: Secondary | ICD-10-CM | POA: Diagnosis not present

## 2020-08-04 DIAGNOSIS — H353211 Exudative age-related macular degeneration, right eye, with active choroidal neovascularization: Secondary | ICD-10-CM | POA: Diagnosis not present

## 2020-08-05 DIAGNOSIS — H353211 Exudative age-related macular degeneration, right eye, with active choroidal neovascularization: Secondary | ICD-10-CM | POA: Diagnosis not present

## 2020-08-10 DIAGNOSIS — H43812 Vitreous degeneration, left eye: Secondary | ICD-10-CM | POA: Diagnosis not present

## 2020-08-19 DIAGNOSIS — I10 Essential (primary) hypertension: Secondary | ICD-10-CM | POA: Diagnosis not present

## 2020-08-19 DIAGNOSIS — E1165 Type 2 diabetes mellitus with hyperglycemia: Secondary | ICD-10-CM | POA: Diagnosis not present

## 2020-08-19 DIAGNOSIS — E7849 Other hyperlipidemia: Secondary | ICD-10-CM | POA: Diagnosis not present

## 2020-08-30 DIAGNOSIS — E109 Type 1 diabetes mellitus without complications: Secondary | ICD-10-CM | POA: Diagnosis not present

## 2020-08-30 DIAGNOSIS — E7849 Other hyperlipidemia: Secondary | ICD-10-CM | POA: Diagnosis not present

## 2020-08-30 DIAGNOSIS — Z0000 Encounter for general adult medical examination without abnormal findings: Secondary | ICD-10-CM | POA: Diagnosis not present

## 2020-08-30 DIAGNOSIS — Z1389 Encounter for screening for other disorder: Secondary | ICD-10-CM | POA: Diagnosis not present

## 2020-08-31 DIAGNOSIS — H353231 Exudative age-related macular degeneration, bilateral, with active choroidal neovascularization: Secondary | ICD-10-CM | POA: Diagnosis not present

## 2020-08-31 DIAGNOSIS — E113293 Type 2 diabetes mellitus with mild nonproliferative diabetic retinopathy without macular edema, bilateral: Secondary | ICD-10-CM | POA: Diagnosis not present

## 2020-09-17 DIAGNOSIS — E1165 Type 2 diabetes mellitus with hyperglycemia: Secondary | ICD-10-CM | POA: Diagnosis not present

## 2020-09-17 DIAGNOSIS — E7849 Other hyperlipidemia: Secondary | ICD-10-CM | POA: Diagnosis not present

## 2020-09-17 DIAGNOSIS — I10 Essential (primary) hypertension: Secondary | ICD-10-CM | POA: Diagnosis not present

## 2020-09-28 DIAGNOSIS — H43812 Vitreous degeneration, left eye: Secondary | ICD-10-CM | POA: Diagnosis not present

## 2020-09-28 DIAGNOSIS — H353231 Exudative age-related macular degeneration, bilateral, with active choroidal neovascularization: Secondary | ICD-10-CM | POA: Diagnosis not present

## 2020-09-28 DIAGNOSIS — H3561 Retinal hemorrhage, right eye: Secondary | ICD-10-CM | POA: Diagnosis not present

## 2020-09-28 DIAGNOSIS — E113293 Type 2 diabetes mellitus with mild nonproliferative diabetic retinopathy without macular edema, bilateral: Secondary | ICD-10-CM | POA: Diagnosis not present

## 2020-10-26 DIAGNOSIS — H3561 Retinal hemorrhage, right eye: Secondary | ICD-10-CM | POA: Diagnosis not present

## 2020-10-26 DIAGNOSIS — H353231 Exudative age-related macular degeneration, bilateral, with active choroidal neovascularization: Secondary | ICD-10-CM | POA: Diagnosis not present

## 2020-10-26 DIAGNOSIS — H43812 Vitreous degeneration, left eye: Secondary | ICD-10-CM | POA: Diagnosis not present

## 2020-10-26 DIAGNOSIS — E113293 Type 2 diabetes mellitus with mild nonproliferative diabetic retinopathy without macular edema, bilateral: Secondary | ICD-10-CM | POA: Diagnosis not present

## 2020-11-16 DIAGNOSIS — I10 Essential (primary) hypertension: Secondary | ICD-10-CM | POA: Diagnosis not present

## 2020-11-16 DIAGNOSIS — E1165 Type 2 diabetes mellitus with hyperglycemia: Secondary | ICD-10-CM | POA: Diagnosis not present

## 2020-11-23 DIAGNOSIS — H353231 Exudative age-related macular degeneration, bilateral, with active choroidal neovascularization: Secondary | ICD-10-CM | POA: Diagnosis not present

## 2020-11-23 DIAGNOSIS — E113293 Type 2 diabetes mellitus with mild nonproliferative diabetic retinopathy without macular edema, bilateral: Secondary | ICD-10-CM | POA: Diagnosis not present

## 2020-11-23 DIAGNOSIS — H43392 Other vitreous opacities, left eye: Secondary | ICD-10-CM | POA: Diagnosis not present

## 2020-11-23 DIAGNOSIS — H43812 Vitreous degeneration, left eye: Secondary | ICD-10-CM | POA: Diagnosis not present

## 2020-12-21 DIAGNOSIS — H353231 Exudative age-related macular degeneration, bilateral, with active choroidal neovascularization: Secondary | ICD-10-CM | POA: Diagnosis not present

## 2020-12-21 DIAGNOSIS — E113293 Type 2 diabetes mellitus with mild nonproliferative diabetic retinopathy without macular edema, bilateral: Secondary | ICD-10-CM | POA: Diagnosis not present

## 2020-12-21 DIAGNOSIS — H43812 Vitreous degeneration, left eye: Secondary | ICD-10-CM | POA: Diagnosis not present

## 2021-01-06 DIAGNOSIS — Z23 Encounter for immunization: Secondary | ICD-10-CM | POA: Diagnosis not present

## 2021-01-17 DIAGNOSIS — I10 Essential (primary) hypertension: Secondary | ICD-10-CM | POA: Diagnosis not present

## 2021-01-17 DIAGNOSIS — E1165 Type 2 diabetes mellitus with hyperglycemia: Secondary | ICD-10-CM | POA: Diagnosis not present

## 2021-02-01 DIAGNOSIS — E113293 Type 2 diabetes mellitus with mild nonproliferative diabetic retinopathy without macular edema, bilateral: Secondary | ICD-10-CM | POA: Diagnosis not present

## 2021-02-01 DIAGNOSIS — H43812 Vitreous degeneration, left eye: Secondary | ICD-10-CM | POA: Diagnosis not present

## 2021-02-01 DIAGNOSIS — H353231 Exudative age-related macular degeneration, bilateral, with active choroidal neovascularization: Secondary | ICD-10-CM | POA: Diagnosis not present

## 2021-02-07 DIAGNOSIS — E109 Type 1 diabetes mellitus without complications: Secondary | ICD-10-CM | POA: Diagnosis not present

## 2021-02-07 DIAGNOSIS — E782 Mixed hyperlipidemia: Secondary | ICD-10-CM | POA: Diagnosis not present

## 2021-02-07 DIAGNOSIS — E7849 Other hyperlipidemia: Secondary | ICD-10-CM | POA: Diagnosis not present

## 2021-02-07 DIAGNOSIS — Z6829 Body mass index (BMI) 29.0-29.9, adult: Secondary | ICD-10-CM | POA: Diagnosis not present

## 2021-02-07 DIAGNOSIS — I10 Essential (primary) hypertension: Secondary | ICD-10-CM | POA: Diagnosis not present

## 2021-02-07 DIAGNOSIS — E039 Hypothyroidism, unspecified: Secondary | ICD-10-CM | POA: Diagnosis not present

## 2021-02-07 DIAGNOSIS — E663 Overweight: Secondary | ICD-10-CM | POA: Diagnosis not present

## 2021-03-15 DIAGNOSIS — H43392 Other vitreous opacities, left eye: Secondary | ICD-10-CM | POA: Diagnosis not present

## 2021-03-15 DIAGNOSIS — H43812 Vitreous degeneration, left eye: Secondary | ICD-10-CM | POA: Diagnosis not present

## 2021-03-15 DIAGNOSIS — E113293 Type 2 diabetes mellitus with mild nonproliferative diabetic retinopathy without macular edema, bilateral: Secondary | ICD-10-CM | POA: Diagnosis not present

## 2021-03-15 DIAGNOSIS — H353231 Exudative age-related macular degeneration, bilateral, with active choroidal neovascularization: Secondary | ICD-10-CM | POA: Diagnosis not present

## 2021-04-26 DIAGNOSIS — H43812 Vitreous degeneration, left eye: Secondary | ICD-10-CM | POA: Diagnosis not present

## 2021-04-26 DIAGNOSIS — H43392 Other vitreous opacities, left eye: Secondary | ICD-10-CM | POA: Diagnosis not present

## 2021-04-26 DIAGNOSIS — H353231 Exudative age-related macular degeneration, bilateral, with active choroidal neovascularization: Secondary | ICD-10-CM | POA: Diagnosis not present

## 2021-04-26 DIAGNOSIS — E113293 Type 2 diabetes mellitus with mild nonproliferative diabetic retinopathy without macular edema, bilateral: Secondary | ICD-10-CM | POA: Diagnosis not present

## 2021-06-07 DIAGNOSIS — H353231 Exudative age-related macular degeneration, bilateral, with active choroidal neovascularization: Secondary | ICD-10-CM | POA: Diagnosis not present

## 2021-06-07 DIAGNOSIS — H43392 Other vitreous opacities, left eye: Secondary | ICD-10-CM | POA: Diagnosis not present

## 2021-06-07 DIAGNOSIS — E113293 Type 2 diabetes mellitus with mild nonproliferative diabetic retinopathy without macular edema, bilateral: Secondary | ICD-10-CM | POA: Diagnosis not present

## 2021-06-07 DIAGNOSIS — H43812 Vitreous degeneration, left eye: Secondary | ICD-10-CM | POA: Diagnosis not present

## 2021-07-03 DIAGNOSIS — Z23 Encounter for immunization: Secondary | ICD-10-CM | POA: Diagnosis not present

## 2021-07-04 DIAGNOSIS — E663 Overweight: Secondary | ICD-10-CM | POA: Diagnosis not present

## 2021-07-04 DIAGNOSIS — R946 Abnormal results of thyroid function studies: Secondary | ICD-10-CM | POA: Diagnosis not present

## 2021-07-04 DIAGNOSIS — E1165 Type 2 diabetes mellitus with hyperglycemia: Secondary | ICD-10-CM | POA: Diagnosis not present

## 2021-07-04 DIAGNOSIS — E039 Hypothyroidism, unspecified: Secondary | ICD-10-CM | POA: Diagnosis not present

## 2021-07-04 DIAGNOSIS — Z6828 Body mass index (BMI) 28.0-28.9, adult: Secondary | ICD-10-CM | POA: Diagnosis not present

## 2021-07-04 DIAGNOSIS — I10 Essential (primary) hypertension: Secondary | ICD-10-CM | POA: Diagnosis not present

## 2021-07-04 DIAGNOSIS — E782 Mixed hyperlipidemia: Secondary | ICD-10-CM | POA: Diagnosis not present

## 2021-07-04 DIAGNOSIS — Z23 Encounter for immunization: Secondary | ICD-10-CM | POA: Diagnosis not present

## 2021-07-19 DIAGNOSIS — H353231 Exudative age-related macular degeneration, bilateral, with active choroidal neovascularization: Secondary | ICD-10-CM | POA: Diagnosis not present

## 2021-07-19 DIAGNOSIS — H43392 Other vitreous opacities, left eye: Secondary | ICD-10-CM | POA: Diagnosis not present

## 2021-07-19 DIAGNOSIS — E113293 Type 2 diabetes mellitus with mild nonproliferative diabetic retinopathy without macular edema, bilateral: Secondary | ICD-10-CM | POA: Diagnosis not present

## 2021-07-19 DIAGNOSIS — H43812 Vitreous degeneration, left eye: Secondary | ICD-10-CM | POA: Diagnosis not present

## 2021-08-30 DIAGNOSIS — H353231 Exudative age-related macular degeneration, bilateral, with active choroidal neovascularization: Secondary | ICD-10-CM | POA: Diagnosis not present

## 2021-08-30 DIAGNOSIS — H43392 Other vitreous opacities, left eye: Secondary | ICD-10-CM | POA: Diagnosis not present

## 2021-08-30 DIAGNOSIS — H43812 Vitreous degeneration, left eye: Secondary | ICD-10-CM | POA: Diagnosis not present

## 2021-08-30 DIAGNOSIS — E113293 Type 2 diabetes mellitus with mild nonproliferative diabetic retinopathy without macular edema, bilateral: Secondary | ICD-10-CM | POA: Diagnosis not present

## 2021-10-04 DIAGNOSIS — E1165 Type 2 diabetes mellitus with hyperglycemia: Secondary | ICD-10-CM | POA: Diagnosis not present

## 2021-10-04 DIAGNOSIS — Z0001 Encounter for general adult medical examination with abnormal findings: Secondary | ICD-10-CM | POA: Diagnosis not present

## 2021-10-04 DIAGNOSIS — Z6828 Body mass index (BMI) 28.0-28.9, adult: Secondary | ICD-10-CM | POA: Diagnosis not present

## 2021-10-04 DIAGNOSIS — R946 Abnormal results of thyroid function studies: Secondary | ICD-10-CM | POA: Diagnosis not present

## 2021-10-04 DIAGNOSIS — E782 Mixed hyperlipidemia: Secondary | ICD-10-CM | POA: Diagnosis not present

## 2021-10-04 DIAGNOSIS — E7849 Other hyperlipidemia: Secondary | ICD-10-CM | POA: Diagnosis not present

## 2021-10-04 DIAGNOSIS — Z1331 Encounter for screening for depression: Secondary | ICD-10-CM | POA: Diagnosis not present

## 2021-10-04 DIAGNOSIS — E039 Hypothyroidism, unspecified: Secondary | ICD-10-CM | POA: Diagnosis not present

## 2021-10-04 DIAGNOSIS — E119 Type 2 diabetes mellitus without complications: Secondary | ICD-10-CM | POA: Diagnosis not present

## 2021-10-04 DIAGNOSIS — E663 Overweight: Secondary | ICD-10-CM | POA: Diagnosis not present

## 2021-10-04 DIAGNOSIS — I10 Essential (primary) hypertension: Secondary | ICD-10-CM | POA: Diagnosis not present

## 2021-10-04 DIAGNOSIS — E109 Type 1 diabetes mellitus without complications: Secondary | ICD-10-CM | POA: Diagnosis not present

## 2021-10-10 DIAGNOSIS — H43812 Vitreous degeneration, left eye: Secondary | ICD-10-CM | POA: Diagnosis not present

## 2021-10-10 DIAGNOSIS — E113293 Type 2 diabetes mellitus with mild nonproliferative diabetic retinopathy without macular edema, bilateral: Secondary | ICD-10-CM | POA: Diagnosis not present

## 2021-10-10 DIAGNOSIS — H43392 Other vitreous opacities, left eye: Secondary | ICD-10-CM | POA: Diagnosis not present

## 2021-10-10 DIAGNOSIS — H353231 Exudative age-related macular degeneration, bilateral, with active choroidal neovascularization: Secondary | ICD-10-CM | POA: Diagnosis not present

## 2021-11-22 DIAGNOSIS — E113293 Type 2 diabetes mellitus with mild nonproliferative diabetic retinopathy without macular edema, bilateral: Secondary | ICD-10-CM | POA: Diagnosis not present

## 2021-11-22 DIAGNOSIS — H353231 Exudative age-related macular degeneration, bilateral, with active choroidal neovascularization: Secondary | ICD-10-CM | POA: Diagnosis not present

## 2021-11-22 DIAGNOSIS — H43812 Vitreous degeneration, left eye: Secondary | ICD-10-CM | POA: Diagnosis not present

## 2021-11-22 DIAGNOSIS — H31011 Macula scars of posterior pole (postinflammatory) (post-traumatic), right eye: Secondary | ICD-10-CM | POA: Diagnosis not present

## 2022-01-03 DIAGNOSIS — H353231 Exudative age-related macular degeneration, bilateral, with active choroidal neovascularization: Secondary | ICD-10-CM | POA: Diagnosis not present

## 2022-01-03 DIAGNOSIS — H43392 Other vitreous opacities, left eye: Secondary | ICD-10-CM | POA: Diagnosis not present

## 2022-01-03 DIAGNOSIS — E113293 Type 2 diabetes mellitus with mild nonproliferative diabetic retinopathy without macular edema, bilateral: Secondary | ICD-10-CM | POA: Diagnosis not present

## 2022-01-03 DIAGNOSIS — H31011 Macula scars of posterior pole (postinflammatory) (post-traumatic), right eye: Secondary | ICD-10-CM | POA: Diagnosis not present

## 2022-02-02 DIAGNOSIS — E782 Mixed hyperlipidemia: Secondary | ICD-10-CM | POA: Diagnosis not present

## 2022-02-02 DIAGNOSIS — E109 Type 1 diabetes mellitus without complications: Secondary | ICD-10-CM | POA: Diagnosis not present

## 2022-02-02 DIAGNOSIS — Z6828 Body mass index (BMI) 28.0-28.9, adult: Secondary | ICD-10-CM | POA: Diagnosis not present

## 2022-02-02 DIAGNOSIS — I10 Essential (primary) hypertension: Secondary | ICD-10-CM | POA: Diagnosis not present

## 2022-02-02 DIAGNOSIS — E039 Hypothyroidism, unspecified: Secondary | ICD-10-CM | POA: Diagnosis not present

## 2022-02-14 DIAGNOSIS — E113293 Type 2 diabetes mellitus with mild nonproliferative diabetic retinopathy without macular edema, bilateral: Secondary | ICD-10-CM | POA: Diagnosis not present

## 2022-02-14 DIAGNOSIS — H353231 Exudative age-related macular degeneration, bilateral, with active choroidal neovascularization: Secondary | ICD-10-CM | POA: Diagnosis not present

## 2022-02-14 DIAGNOSIS — H43392 Other vitreous opacities, left eye: Secondary | ICD-10-CM | POA: Diagnosis not present

## 2022-02-14 DIAGNOSIS — H43812 Vitreous degeneration, left eye: Secondary | ICD-10-CM | POA: Diagnosis not present

## 2022-03-16 DIAGNOSIS — H353231 Exudative age-related macular degeneration, bilateral, with active choroidal neovascularization: Secondary | ICD-10-CM | POA: Diagnosis not present

## 2022-04-06 ENCOUNTER — Other Ambulatory Visit (HOSPITAL_COMMUNITY): Payer: Self-pay | Admitting: Family Medicine

## 2022-04-06 ENCOUNTER — Other Ambulatory Visit: Payer: Self-pay | Admitting: Family Medicine

## 2022-04-06 DIAGNOSIS — R221 Localized swelling, mass and lump, neck: Secondary | ICD-10-CM | POA: Diagnosis not present

## 2022-04-06 DIAGNOSIS — Z6828 Body mass index (BMI) 28.0-28.9, adult: Secondary | ICD-10-CM | POA: Diagnosis not present

## 2022-04-13 ENCOUNTER — Ambulatory Visit (HOSPITAL_COMMUNITY)
Admission: RE | Admit: 2022-04-13 | Discharge: 2022-04-13 | Disposition: A | Discharge status: Home / Self Care | Payer: Medicare Other | Source: Ambulatory Visit | Attending: Family Medicine | Admitting: Family Medicine

## 2022-04-13 DIAGNOSIS — R221 Localized swelling, mass and lump, neck: Secondary | ICD-10-CM | POA: Diagnosis not present

## 2022-04-16 ENCOUNTER — Other Ambulatory Visit: Payer: Self-pay | Admitting: Family Medicine

## 2022-04-16 ENCOUNTER — Other Ambulatory Visit (HOSPITAL_COMMUNITY): Payer: Self-pay | Admitting: Family Medicine

## 2022-04-16 DIAGNOSIS — R221 Localized swelling, mass and lump, neck: Secondary | ICD-10-CM

## 2022-04-17 DIAGNOSIS — E113293 Type 2 diabetes mellitus with mild nonproliferative diabetic retinopathy without macular edema, bilateral: Secondary | ICD-10-CM | POA: Diagnosis not present

## 2022-04-17 DIAGNOSIS — H353231 Exudative age-related macular degeneration, bilateral, with active choroidal neovascularization: Secondary | ICD-10-CM | POA: Diagnosis not present

## 2022-04-17 DIAGNOSIS — H43392 Other vitreous opacities, left eye: Secondary | ICD-10-CM | POA: Diagnosis not present

## 2022-04-17 DIAGNOSIS — H43812 Vitreous degeneration, left eye: Secondary | ICD-10-CM | POA: Diagnosis not present

## 2022-04-25 ENCOUNTER — Ambulatory Visit (HOSPITAL_COMMUNITY)
Admission: RE | Admit: 2022-04-25 | Discharge: 2022-04-25 | Disposition: A | Discharge status: Home / Self Care | Payer: Medicare Other | Source: Ambulatory Visit | Attending: Family Medicine | Admitting: Family Medicine

## 2022-04-25 DIAGNOSIS — R221 Localized swelling, mass and lump, neck: Secondary | ICD-10-CM

## 2022-04-25 DIAGNOSIS — M47812 Spondylosis without myelopathy or radiculopathy, cervical region: Secondary | ICD-10-CM | POA: Diagnosis not present

## 2022-04-25 DIAGNOSIS — C779 Secondary and unspecified malignant neoplasm of lymph node, unspecified: Secondary | ICD-10-CM | POA: Diagnosis not present

## 2022-04-25 DIAGNOSIS — E041 Nontoxic single thyroid nodule: Secondary | ICD-10-CM | POA: Diagnosis not present

## 2022-04-25 DIAGNOSIS — R590 Localized enlarged lymph nodes: Secondary | ICD-10-CM | POA: Diagnosis not present

## 2022-04-25 MED ORDER — IOHEXOL 300 MG/ML  SOLN
75.0000 mL | Freq: Once | INTRAMUSCULAR | Status: AC | PRN
  Administered 2022-04-25: 75 mL via INTRAVENOUS

## 2022-04-26 LAB — POCT I-STAT CREATININE: Creatinine, Ser: 1 mg/dL (ref 0.61–1.24)

## 2022-04-27 ENCOUNTER — Other Ambulatory Visit (HOSPITAL_COMMUNITY): Payer: Self-pay | Admitting: Family Medicine

## 2022-04-27 ENCOUNTER — Ambulatory Visit (HOSPITAL_COMMUNITY)
Admission: RE | Admit: 2022-04-27 | Discharge: 2022-04-27 | Disposition: A | Discharge status: Home / Self Care | Payer: Medicare Other | Source: Ambulatory Visit | Attending: Family Medicine | Admitting: Family Medicine

## 2022-04-27 ENCOUNTER — Other Ambulatory Visit: Payer: Self-pay | Admitting: Family Medicine

## 2022-04-27 DIAGNOSIS — R599 Enlarged lymph nodes, unspecified: Secondary | ICD-10-CM | POA: Insufficient documentation

## 2022-04-27 DIAGNOSIS — R221 Localized swelling, mass and lump, neck: Secondary | ICD-10-CM | POA: Diagnosis not present

## 2022-04-27 DIAGNOSIS — R918 Other nonspecific abnormal finding of lung field: Secondary | ICD-10-CM | POA: Diagnosis not present

## 2022-04-27 MED ORDER — IOHEXOL 300 MG/ML  SOLN
75.0000 mL | Freq: Once | INTRAMUSCULAR | Status: AC | PRN
  Administered 2022-04-27: 75 mL via INTRAVENOUS

## 2022-05-01 ENCOUNTER — Other Ambulatory Visit: Payer: Self-pay

## 2022-05-01 DIAGNOSIS — C3490 Malignant neoplasm of unspecified part of unspecified bronchus or lung: Secondary | ICD-10-CM

## 2022-05-01 NOTE — Progress Notes (Signed)
Referral received and reviewed. Introductory phone call placed to patient today. Introduced myself and explained my role in the patient's care. Patient made aware of scans ordered by Dr. Delton Coombes. No further questions at this time per patient. Schedule pending.

## 2022-05-01 NOTE — Progress Notes (Signed)
Orders for PET scan and brain MRI ordered per verbal order from Dr. Delton Coombes

## 2022-05-03 ENCOUNTER — Ambulatory Visit (HOSPITAL_COMMUNITY)
Admission: RE | Admit: 2022-05-03 | Discharge: 2022-05-03 | Disposition: A | Discharge status: Home / Self Care | Payer: Medicare Other | Source: Ambulatory Visit | Attending: Hematology | Admitting: Hematology

## 2022-05-03 DIAGNOSIS — E041 Nontoxic single thyroid nodule: Secondary | ICD-10-CM | POA: Diagnosis not present

## 2022-05-03 DIAGNOSIS — I700 Atherosclerosis of aorta: Secondary | ICD-10-CM | POA: Diagnosis not present

## 2022-05-03 DIAGNOSIS — K449 Diaphragmatic hernia without obstruction or gangrene: Secondary | ICD-10-CM | POA: Diagnosis not present

## 2022-05-03 DIAGNOSIS — R590 Localized enlarged lymph nodes: Secondary | ICD-10-CM | POA: Diagnosis not present

## 2022-05-03 DIAGNOSIS — R918 Other nonspecific abnormal finding of lung field: Secondary | ICD-10-CM | POA: Diagnosis not present

## 2022-05-03 DIAGNOSIS — C3490 Malignant neoplasm of unspecified part of unspecified bronchus or lung: Secondary | ICD-10-CM | POA: Diagnosis not present

## 2022-05-03 DIAGNOSIS — I2510 Atherosclerotic heart disease of native coronary artery without angina pectoris: Secondary | ICD-10-CM | POA: Diagnosis not present

## 2022-05-03 MED ORDER — FLUDEOXYGLUCOSE F - 18 (FDG) INJECTION
9.7200 | Freq: Once | INTRAVENOUS | Status: AC | PRN
  Administered 2022-05-03: 9.72 via INTRAVENOUS

## 2022-05-08 ENCOUNTER — Other Ambulatory Visit: Payer: Self-pay

## 2022-05-08 DIAGNOSIS — C3490 Malignant neoplasm of unspecified part of unspecified bronchus or lung: Secondary | ICD-10-CM

## 2022-05-08 NOTE — Progress Notes (Signed)
Order placed for CT biopsy per verbal order.

## 2022-05-08 NOTE — Progress Notes (Unsigned)
Markus Daft, MD  Donita Brooks D US guided right neck biopsy.  Sedation is optional and patient choice.   [place]

## 2022-05-09 ENCOUNTER — Ambulatory Visit (HOSPITAL_COMMUNITY)
Admission: RE | Admit: 2022-05-09 | Discharge: 2022-05-09 | Disposition: A | Discharge status: Home / Self Care | Payer: Medicare Other | Source: Ambulatory Visit | Attending: Hematology | Admitting: Hematology

## 2022-05-09 DIAGNOSIS — C3490 Malignant neoplasm of unspecified part of unspecified bronchus or lung: Secondary | ICD-10-CM | POA: Insufficient documentation

## 2022-05-09 DIAGNOSIS — G319 Degenerative disease of nervous system, unspecified: Secondary | ICD-10-CM | POA: Diagnosis not present

## 2022-05-09 MED ORDER — GADOBUTROL 1 MMOL/ML IV SOLN
8.5000 mL | Freq: Once | INTRAVENOUS | Status: AC | PRN
  Administered 2022-05-09: 8.5 mL via INTRAVENOUS

## 2022-05-14 ENCOUNTER — Inpatient Hospital Stay: Payer: Medicare Other | Admitting: Hematology

## 2022-05-14 ENCOUNTER — Other Ambulatory Visit: Payer: Self-pay | Admitting: Internal Medicine

## 2022-05-14 DIAGNOSIS — R221 Localized swelling, mass and lump, neck: Secondary | ICD-10-CM

## 2022-05-15 ENCOUNTER — Encounter (HOSPITAL_COMMUNITY): Payer: Self-pay

## 2022-05-15 ENCOUNTER — Other Ambulatory Visit: Payer: Self-pay

## 2022-05-15 ENCOUNTER — Ambulatory Visit (HOSPITAL_COMMUNITY)
Admission: RE | Admit: 2022-05-15 | Discharge: 2022-05-15 | Disposition: A | Discharge status: Home / Self Care | Payer: Medicare Other | Source: Ambulatory Visit | Attending: Hematology | Admitting: Hematology

## 2022-05-15 DIAGNOSIS — R221 Localized swelling, mass and lump, neck: Secondary | ICD-10-CM | POA: Diagnosis not present

## 2022-05-15 DIAGNOSIS — I10 Essential (primary) hypertension: Secondary | ICD-10-CM | POA: Insufficient documentation

## 2022-05-15 DIAGNOSIS — Z9049 Acquired absence of other specified parts of digestive tract: Secondary | ICD-10-CM | POA: Insufficient documentation

## 2022-05-15 DIAGNOSIS — Q892 Congenital malformations of other endocrine glands: Secondary | ICD-10-CM | POA: Diagnosis not present

## 2022-05-15 DIAGNOSIS — C3490 Malignant neoplasm of unspecified part of unspecified bronchus or lung: Secondary | ICD-10-CM | POA: Diagnosis present

## 2022-05-15 DIAGNOSIS — Z8673 Personal history of transient ischemic attack (TIA), and cerebral infarction without residual deficits: Secondary | ICD-10-CM | POA: Diagnosis not present

## 2022-05-15 DIAGNOSIS — E041 Nontoxic single thyroid nodule: Secondary | ICD-10-CM | POA: Insufficient documentation

## 2022-05-15 DIAGNOSIS — E119 Type 2 diabetes mellitus without complications: Secondary | ICD-10-CM | POA: Insufficient documentation

## 2022-05-15 DIAGNOSIS — R222 Localized swelling, mass and lump, trunk: Secondary | ICD-10-CM | POA: Diagnosis not present

## 2022-05-15 DIAGNOSIS — D171 Benign lipomatous neoplasm of skin and subcutaneous tissue of trunk: Secondary | ICD-10-CM | POA: Diagnosis not present

## 2022-05-15 DIAGNOSIS — C7989 Secondary malignant neoplasm of other specified sites: Secondary | ICD-10-CM | POA: Diagnosis not present

## 2022-05-15 DIAGNOSIS — Z8669 Personal history of other diseases of the nervous system and sense organs: Secondary | ICD-10-CM | POA: Diagnosis not present

## 2022-05-15 DIAGNOSIS — C801 Malignant (primary) neoplasm, unspecified: Secondary | ICD-10-CM | POA: Diagnosis not present

## 2022-05-15 LAB — CBC WITH DIFFERENTIAL/PLATELET
Abs Immature Granulocytes: 0.05 10*3/uL (ref 0.00–0.07)
Basophils Absolute: 0.1 10*3/uL (ref 0.0–0.1)
Basophils Relative: 1 %
Eosinophils Absolute: 0.2 10*3/uL (ref 0.0–0.5)
Eosinophils Relative: 4 %
HCT: 46.7 % (ref 39.0–52.0)
Hemoglobin: 16.1 g/dL (ref 13.0–17.0)
Immature Granulocytes: 1 %
Lymphocytes Relative: 25 %
Lymphs Abs: 1.5 10*3/uL (ref 0.7–4.0)
MCH: 34.3 pg — ABNORMAL HIGH (ref 26.0–34.0)
MCHC: 34.5 g/dL (ref 30.0–36.0)
MCV: 99.4 fL (ref 80.0–100.0)
Monocytes Absolute: 0.7 10*3/uL (ref 0.1–1.0)
Monocytes Relative: 11 %
Neutro Abs: 3.5 10*3/uL (ref 1.7–7.7)
Neutrophils Relative %: 58 %
Platelets: 282 10*3/uL (ref 150–400)
RBC: 4.7 MIL/uL (ref 4.22–5.81)
RDW: 14.1 % (ref 11.5–15.5)
WBC: 5.9 10*3/uL (ref 4.0–10.5)
nRBC: 0 % (ref 0.0–0.2)

## 2022-05-15 LAB — PROTIME-INR
INR: 1 (ref 0.8–1.2)
Prothrombin Time: 12.7 seconds (ref 11.4–15.2)

## 2022-05-15 LAB — GLUCOSE, CAPILLARY: Glucose-Capillary: 172 mg/dL — ABNORMAL HIGH (ref 70–99)

## 2022-05-15 MED ORDER — SODIUM CHLORIDE 0.9 % IV SOLN: INTRAVENOUS | Status: DC

## 2022-05-15 MED ORDER — FENTANYL CITRATE (PF) 100 MCG/2ML IJ SOLN
INTRAMUSCULAR | Status: AC
  Filled 2022-05-15: qty 2

## 2022-05-15 MED ORDER — MIDAZOLAM HCL 2 MG/2ML IJ SOLN
INTRAMUSCULAR | Status: AC
  Filled 2022-05-15: qty 2

## 2022-05-15 MED ORDER — LIDOCAINE HCL 1 % IJ SOLN
INTRAMUSCULAR | Status: AC
  Administered 2022-05-15: 10 mL
  Filled 2022-05-15: qty 20

## 2022-05-15 NOTE — Procedures (Signed)
Interventional Radiology Procedure Note  Procedure: Ultrasound guided right neck mass biopsy  Findings: Please refer to procedural dictation for full description. 18 ga core from right neck mass x4.  Complications: None immediate  Estimated Blood Loss: < 5 mL  Recommendations: Follow up Pathology results.   Ruthann Cancer, MD

## 2022-05-15 NOTE — Progress Notes (Signed)
Patient back from procedure in IR. Report from [place], [place]. Patient did not receive fentanyl or versed for procedure, only local anesthetic applied (per report).  Pt ok to leave ASAP.  Call to son, Simona Huh to notify. Dennis ready for pick up at 1400.

## 2022-05-15 NOTE — Discharge Instructions (Addendum)
Please call Interventional Radiology clinic [phone] with any questions or concerns.  You may remove your dressing and shower tomorrow.   Biopsy, Care After After a biopsy, it is common to have these things in the area where the biopsy was done. You may: Have pain. Feel sore. Have bruising. You may also feel tired for a few days. Follow these instructions at home: Medicines Take over-the-counter and prescription medicines only as told by your doctor. If you were prescribed an antibiotic medicine, take it as told by your doctor. Do not stop taking the antibiotic, even if you start to feel better. Do not take medicines that may thin your blood. These medicines include aspirin and ibuprofen. Take them only if your doctor tells you to. If told, take steps to prevent problems with pooping (constipation). You may need to: Drink enough fluid to keep your pee (urine) pale yellow. Take medicines. You will be told what medicines to take. Eat foods that are high in fiber. These include beans, whole grains, and fresh fruits and vegetables. Limit foods that are high in fat and sugar. These include fried or sweet foods. Ask your doctor if you should avoid driving or using machines while you are taking your medicine. Caring for your incision Follow instructions from your doctor about how to take care of your cut from surgery (incisions). Make sure you: Wash your hands with soap and water for at least 20 seconds before and after you change your bandage. If you cannot use soap and water, use hand sanitizer. Change your bandage. Leavestitches or skin glue in place for at least two weeks. Leave tape strips alone unless you are told to take them off. You may trim the edges of the tape strips if they curl up. Check your incision every day for signs of infection. Check for: Redness, swelling, or more pain. Fluid or blood. Warmth. Pus or a bad smell. Do not take baths, swim, or use a hot tub. Ask your  doctor about taking showers or sponge baths. Activity Rest at home for 1-2 days, or as told by your doctor. Get up to take short walks every 1 to 2 hours. Ask for help if you feel weak or unsteady. Do not lift anything that is heavier than 10 lb (4.5 kg), or the limit that you are told. Do not play contact sports for 2 weeks after the procedure. Return to your normal activities as told by your doctor. Ask what activities are safe for you. General instructions Do not drink alcohol in the first week after the procedure. Plan to have a responsible adult care for you for the time you are told after you leave the hospital or clinic. This is important. It is up to you to get the results of your procedure. Ask how to get your results when they are ready. Keep all follow-up visits.   Contact a doctor if: You have more bleeding in your incision. Your incision swells, or is red and more painful. You have fluid that comes from your incision. You develop a rash. You have fever or chills. Get help right away if: You have swelling, bloating, or pain in your belly (abdomen). You get dizzy or faint. You vomit or you feel like vomiting. You have trouble breathing or feel short of breath. You have chest pain. You have problems talking or seeing. You have trouble with your balance or moving your arms or legs. These symptoms may be an emergency. Get help right away. Call your  local emergency services (911 in the [place] Do not wait to see if the symptoms will go away. Do not drive yourself to the hospital. Summary After the procedure, it is common to have pain, soreness, bruising, and tiredness. Your doctor will tell you how to take care of yourself at home. Change your bandage, take your medicines, and limit your activities as told by your doctor. Call your doctor if you have symptoms of infection. Get help right away if your belly swells, your cut bleeds a lot, or you have trouble talking or  breathing. This information is not intended to replace advice given to you by your health care provider. Make sure you discuss any questions you have with your healthcare provider. Document Revised: 06/20/2020 Document Reviewed: 06/20/2020 Elsevier Patient Education  2022 [employer].

## 2022-05-15 NOTE — Consult Note (Signed)
Chief Complaint: Patient was seen in consultation today for image guided right neck/ supraclavicular mass/lymph node biopsy  Referring Physician(s): [place]  Supervising Physician: Ruthann Cancer  Patient Status: [facility] - Out-pt  History of Present Illness: Jeremy Ferrell is an 87 y.o. 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 He presents now with 6 to 70-month history of lower right neck/supraclavicular region mass, nontender to palpation which pt claims has decreased in size.  Patient reports no history of cancer.  PET scan on 05/04/2022 revealed:  1. Large right neck mass is markedly hypermetabolic and consistent with a neoplastic process. Recommend biopsy. 2. Numerous pulmonary and pleural nodules demonstrating mild hypermetabolism and worrisome for metastatic disease. 3. No findings for abdominal/pelvic metastatic disease or osseous metastatic disease.  He is scheduled today for image guided biopsy of the right neck/nodal mass for further evaluation.  Past Medical History:  Diagnosis Date   Diabetes ([place])    METFORMIN    Past Surgical History:  Procedure Laterality Date   CHOLECYSTECTOMY      Allergies: Penicillins  Medications: Prior to Admission medications   Not on File     History reviewed. No pertinent family history.  Social History   Socioeconomic History   Marital status: Widowed    Spouse name: Not on file   Number of children: Not on file   Years of education: Not on file   Highest education level: Not on file  Occupational History   Not on file  Tobacco Use   Smoking status: Never   Smokeless tobacco: Never  Substance and Sexual Activity   Alcohol use: Not on file   Drug use: Not on file   Sexual  activity: Not on file  Other Topics Concern   Not on file  Social History Narrative   Not on file   Social Determinants of Health   Financial Resource Strain: Not on file  Food Insecurity: Not on file  Transportation Needs: Not on file  Physical Activity: Not on file  Stress: Not on file  Social Connections: Not on file      Review of Systems currently denies fever, headache, chest pain, dyspnea, cough, abdominal/back pain, nausea, vomiting or bleeding.  Vital Signs: BP (!) 141/70   Resp 16   SpO2 (!) 70%      Physical Exam awake, answering simple questions okay.  Chest clear to auscultation bilaterally.  Heart with regular rate and rhythm.  Abdomen soft, positive bowel sounds, nontender.  Bilateral pretibial edema noted.  Fullness in right lower neck/supraclavicular region, nontender to palpation.  Imaging: MR Brain W Wo Contrast  Result Date: 05/10/2022 CLINICAL DATA:  Provided history: Non-small cell lung cancer, staging. EXAM: MRI HEAD WITHOUT AND WITH CONTRAST TECHNIQUE: Multiplanar, multiecho pulse sequences of the brain and surrounding structures were obtained without and with intravenous contrast. CONTRAST:  8.91mL GADAVIST GADOBUTROL 1 MMOL/ML IV SOLN COMPARISON:  Neck CT 04/25/2022.  PET-CT 05/03/2022. FINDINGS: Mild intermittent motion degradation. Brain: Mild-to-moderate generalized cerebral atrophy. Mild cerebellar atrophy. Chronic lacunar infarct within the anterior right frontal lobe white matter. There is no acute infarct. No evidence of an intracranial mass. No chronic intracranial blood products. No extra-axial fluid collection. No midline shift. No pathologic intracranial enhancement identified. Vascular: Maintained flow voids within the proximal large arterial vessels. Skull and upper cervical spine:  No focal suspicious marrow lesion Sinuses/Orbits: No mass or acute finding within the imaged orbits. Prior bilateral ocular lens replacement. 2.5 cm left maxillary  sinus mucous retention cyst. IMPRESSION: 1. No evidence of intracranial metastatic disease. 2. 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 Mild-to-moderate generalized cerebral atrophy. Mild cerebellar atrophy. 4. 2.5 cm left maxillary sinus mucous retention cyst. Electronically Signed   By: Kellie Simmering D.O.   On: 05/10/2022 07:53   NM PET Image Initial (PI) Skull Base To Thigh  Result Date: 05/04/2022 CLINICAL DATA:  Initial treatment strategy for pulmonary nodules. EXAM: NUCLEAR MEDICINE PET SKULL BASE TO THIGH TECHNIQUE: 9.72 mCi F-18 FDG was injected intravenously. Full-ring PET imaging was performed from the skull base to thigh after the radiotracer. CT data was obtained and used for attenuation correction and anatomic localization. Fasting blood glucose: 77 mg/dl COMPARISON:  CT scan 04/25/2022 FINDINGS: Mediastinal blood pool activity: SUV max 2.33 Liver activity: SUV max NA NECK: The large right neck mass is markedly hypermetabolic with SUV max 73.22 and consistent neoplasm. Recommend biopsy. Adjacent calcified nodes are not hypermetabolic. 10 mm right thyroid nodule is not hypermetabolic and likely benign fusion. No follow-up is necessary. Incidental CT findings: None. CHEST: The ground-glass nodule in the right upper lobe measures approximately 2.3 cm and demonstrates mild hypermetabolism with SUV max of 1.83. Several small solid bilateral pulmonary nodules demonstrate mild hypermetabolism. 9 mm peripheral right upper lobe nodule has an SUV max of 1.59. Adjacent pleural nodule has an SUV max of 2.15. Left upper lobe pleural lesion has an SUV max of 2.90. Right upper lobe pleural lesion on image 45/7 has an SUV max of 2.58. Right lower lobe pulmonary nodule at the right lung base has SUV max of 3.42. No enlarged or hypermetabolic mediastinal or hilar lymph nodes. No axillary adenopathy. Incidental CT findings: Benign left-sided pleural lipoma. Stable aortic and  coronary artery calcifications. Large hiatal hernia. ABDOMEN/PELVIS: No findings suspicious for osseous metastatic disease. There is moderate hypermetabolism throughout the colon which could be due to patient recently ED or taking insulin. Incidental CT findings: Vascular calcifications. Status post cholecystectomy. Colonic diverticulosis. Enlarged prostate gland. SKELETON: No findings for osseous metastatic disease. FDG uptake is noted in the subcutaneous fat overlying the anterior aspect of the mid thighs bilaterally. There appears to be inflammatory changes in the subcutaneous fat and this could be related to recent trauma or injections. Incidental CT findings: None. IMPRESSION: 1. Large right neck mass is markedly hypermetabolic and consistent with a neoplastic process. Recommend biopsy. 2. Numerous pulmonary and pleural nodules demonstrating mild hypermetabolism and worrisome for metastatic disease. 3. No findings for abdominal/pelvic metastatic disease or osseous metastatic disease. Electronically Signed   By: Marijo Sanes M.D.   On: 05/04/2022 18:00   CT CHEST W CONTRAST  Result Date: 04/27/2022 CLINICAL DATA:  Lung nodule EXAM: CT CHEST WITH CONTRAST TECHNIQUE: Multidetector CT imaging of the chest was performed during intravenous contrast administration. RADIATION DOSE REDUCTION: This exam was performed according to the departmental dose-optimization program which includes automated exposure control, adjustment of the mA and/or kV according to patient size and/or use of iterative reconstruction technique. CONTRAST:  78mL OMNIPAQUE IOHEXOL 300 MG/ML  SOLN COMPARISON:  Chest CT dated November 19, 2013; CT neck dated April 25, 2022 FINDINGS: Cardiovascular: Normal heart size. No pericardial effusion. Moderate coronary artery calcifications of the left main and RCA. Normal caliber thoracic aorta with mild atherosclerotic disease. Mediastinum/Nodes: Moderate hiatal hernia. 12 mm  right-sided thyroid nodule, no  specific follow-up imaging is recommended. Heterogeneous mass of the right neck base located on series 2, image 7. Heterogeneous partially calcified right supraclavicular lymph node measuring 8 mm on series 2 image 22. No enlarged lymph nodes seen in the chest. Lungs/Pleura: Central airways are patent. No consolidation, pleural effusion or pneumothorax. Innumerable small randomly distributed solid pulmonary nodules are seen primarily in the lower lungs which are new compared with prior CT. Reference subpleural nodule of the right lower lobe measuring 5 mm on series 4, image 85. Reference solid nodule of the right lower lobe measuring 7 mm on image 117. Reference solid pulmonary nodule of the left lower lobe measuring 4 mm on image 113. Ground-glass nodule of the right upper lobe measuring 2.8 x 2.6 cm on series 4, image 28, present on prior exam and measured proximally 1.8 x 1.7 cm, differences in slice thickness limit evaluation. Upper Abdomen: Cholecystectomy clips.  No acute abnormalities. Musculoskeletal: Left chest wall lipoma, unchanged when compared with the prior. No acute or significant osseous findings. IMPRESSION: 1. Numerous solid pulmonary nodules are seen primarily in the lower lungs with a random pattern of distribution, nodules are new when compared with prior CT and concerning for metastatic disease. 2. Ground-glass nodule of the right upper lobe is increased in size when compared with 2015 prior and likely indolent primary lung adenocarcinoma. 3. Heterogeneous partially calcified mass of the right neck, see recent prior neck CT for further discussion. 4. Heterogeneous, calcified subcentimeter right supraclavicular lymph node, concerning for nodal metastatic disease given morphologic similarity to right neck mass. 5. Moderate coronary artery calcifications of the left main and RCA. 6.  Aortic Atherosclerosis (ICD10-I70.0). Electronically Signed   By: Yetta Glassman M.D.   On: 04/27/2022 17:09    CT SOFT TISSUE NECK W CONTRAST  Result Date: 04/27/2022 CLINICAL DATA:  Swelling to right side of neck for 6 months EXAM: CT NECK WITH CONTRAST TECHNIQUE: Multidetector CT imaging of the neck was performed using the standard protocol following the bolus administration of intravenous contrast. RADIATION DOSE REDUCTION: This exam was performed according to the departmental dose-optimization program which includes automated exposure control, adjustment of the mA and/or kV according to patient size and/or use of iterative reconstruction technique. CONTRAST:  57mL OMNIPAQUE IOHEXOL 300 MG/ML  SOLN COMPARISON:  Neck ultrasound 04/13/2022 FINDINGS: Pharynx and larynx: No evidence of mass or inflammation. Salivary glands: No inflammation, mass, or stone. Thyroid: 12 mm right thyroid nodule. No followup recommended (ref: J Am Coll Radiol. 2015 Feb;12(2): 143-50). Lymph nodes: Conglomerate of irregular partially calcified enlarged lymph nodes in the right mid to lower jugular chain, in total measuring up to 4.2 cm. These have a malignant appearance as from metastatic disease with size and irregularity implying extracapsular tumor. Vascular: Scattered atheromatous calcification Limited intracranial: Negative. Visualized orbits: Negative. Mastoids and visualized paranasal sinuses: Clear. Skeleton: Ordinary degenerative changes in the cervical spine. Chronic fracture through the midline hyoid. Upper chest: Ground-glass nodule in the right upper lobe that is new from 2015 and measures approximately 2.3 cm. Multiple small subpleural nodules in the bilateral upper lungs. These results will be called to the ordering clinician or representative by the Radiologist Assistant, and communication documented in the PACS or [employer]. IMPRESSION: 1. ~4 cm irregular mass in the right jugular chain, likely metastatic nodes. No primary tumor is seen, recommend ENT referral. 2. Ground-glass nodule in the right upper lobe measuring  2.3 cm. Additional smaller subpleural nodules. Recommend full chest  CT given the above. Electronically Signed   By: Jorje Guild M.D.   On: 04/27/2022 04:34    Labs:  CBC: No results for input(s): "WBC", "HGB", "HCT", "PLT" in the last 8760 hours.  COAGS: No results for input(s): "INR", "APTT" in the last 8760 hours.  BMP: Recent Labs    04/25/22 1436  CREATININE 1.00    LIVER FUNCTION TESTS: No results for input(s): "BILITOT", "AST", "ALT", "ALKPHOS", "PROT", "ALBUMIN" in the last 8760 hours.  TUMOR MARKERS: No results for input(s): "AFPTM", "CEA", "CA199", "CHROMGRNA" in the last 8760 hours.  Assessment and Plan: 87 y.o. 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 He presents now with 6 to 58-month history of lower right neck/supraclavicular region mass, nontender to palpation which pt claims has decreased in size.  Patient reports no history of cancer.  PET scan on 05/04/2022 revealed:  1. Large right neck mass is markedly hypermetabolic and consistent with a neoplastic process. Recommend biopsy. 2. Numerous pulmonary and pleural nodules demonstrating mild hypermetabolism and worrisome for metastatic disease. 3. No findings for abdominal/pelvic metastatic disease or osseous metastatic disease.  He is scheduled today for image guided biopsy of the right neck/nodal mass for further evaluation.Risks and benefits of procedure was discussed with the patient and/or patient's family including, but not limited to bleeding, infection, damage to adjacent structures or low yield requiring additional tests.  All of the questions were answered and there is agreement to proceed.  Consent signed and in chart.  LABS PENDING  Thank you for this interesting  consult.  I greatly enjoyed meeting KUNAAL WALKINS and look forward to participating in their care.  A copy of this report was sent to the requesting provider on this date.  Electronically Signed: D. Rowe Robert, PA-C 05/15/2022, 11:28 AM   I spent a total of  20 minutes  in face to face in clinical consultation, greater than 50% of which was counseling/coordinating care for image guided right neck/nodal mass biopsy

## 2022-05-16 DIAGNOSIS — H353211 Exudative age-related macular degeneration, right eye, with active choroidal neovascularization: Secondary | ICD-10-CM | POA: Diagnosis not present

## 2022-05-18 LAB — SURGICAL PATHOLOGY

## 2022-05-20 DIAGNOSIS — C801 Malignant (primary) neoplasm, unspecified: Secondary | ICD-10-CM

## 2022-05-20 HISTORY — DX: Malignant (primary) neoplasm, unspecified: C80.1

## 2022-05-22 ENCOUNTER — Inpatient Hospital Stay: Payer: Medicare Other | Attending: Hematology | Admitting: Hematology

## 2022-05-22 ENCOUNTER — Inpatient Hospital Stay: Payer: Medicare Other

## 2022-05-22 DIAGNOSIS — R918 Other nonspecific abnormal finding of lung field: Secondary | ICD-10-CM | POA: Insufficient documentation

## 2022-05-22 DIAGNOSIS — Z87891 Personal history of nicotine dependence: Secondary | ICD-10-CM | POA: Insufficient documentation

## 2022-05-22 DIAGNOSIS — E119 Type 2 diabetes mellitus without complications: Secondary | ICD-10-CM | POA: Diagnosis not present

## 2022-05-22 DIAGNOSIS — C73 Malignant neoplasm of thyroid gland: Secondary | ICD-10-CM | POA: Diagnosis not present

## 2022-05-22 LAB — T4, FREE: Free T4: 0.98 ng/dL (ref 0.61–1.12)

## 2022-05-22 LAB — TSH: TSH: 3.287 u[IU]/mL (ref 0.350–4.500)

## 2022-05-22 NOTE — Patient Instructions (Addendum)
[facility]  Discharge Instructions  You were seen and examined today by Dr. Delton Coombes. Dr. Delton Coombes is a medical oncologist, meaning that he specializes in the treatment of cancer diagnoses. Dr. Delton Coombes discussed your past medical history, family history of cancers, and the events that led to you being here today.  You were referred to Dr. Delton Coombes due to an abnormal CT scan. That CT scan lead to a PET scan, a brain MRI and a biopsy that Dr. Delton Coombes has reviewed with you today.  You have been diagnosed with Thyroid Cancer. It appears that the cancer has spread to your lymph nodes (this is what you are feeling on your neck) and the PET scan is also suspicious for spread of cancer to the lung but it is unclear. Because the PET scan is not entirely clear, Dr. Delton Coombes will discuss your case with our local endocrinologist to discuss a thyroid cancer specific scan. Your brain MRI was clean.  Dr. Delton Coombes has recommended additional labs today in order to further assess thyroid function.  You will be hearing from our office about the thyroid-specific iodine scan.  Follow-up as scheduled.  Thank you for choosing [facility] to provide your oncology and hematology care.   To afford each patient quality time with our provider, please arrive at least 15 minutes before your scheduled appointment time. You may need to reschedule your appointment if you arrive late (10 or more minutes). Arriving late affects you and other patients whose appointments are after yours.  Also, if you miss three or more appointments without notifying the office, you may be dismissed from the clinic at the provider's discretion.    Again, thank you for choosing [facility].  Our hope is that these requests will decrease the amount of time that you wait before being seen by our physicians.   If you have a lab appointment with the [facility] please  come in thru the Main Entrance and check in at the main information desk.           _____________________________________________________________  Should you have questions after your visit to [facility], please contact our office at [phone] and follow the prompts.  Our office hours are 8:00 a.m. to 4:30 p.m. Monday - Thursday and 8:00 a.m. to 2:30 p.m. Friday.  Please note that voicemails left after 4:00 p.m. may not be returned until the following business day.  We are closed weekends and all major holidays.  You do have access to a nurse 24-7, just call the main number to the clinic [phone] and do not press any options, hold on the line and a nurse will answer the phone.    For prescription refill requests, have your pharmacy contact our office and allow 72 hours.    Masks are optional in the cancer centers. If you would like for your care team to wear a mask while they are taking care of you, please let them know. You may have one support person who is at least 85 years old accompany you for your appointments.

## 2022-05-22 NOTE — Progress Notes (Signed)
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 male is seen in consultation today at the request of Dr. Hilma Favors for further work-up and management of right neck mass.  He noticed right neck mass for the past few months.  Denies any dysphagia or odynophagia.  Denied any fevers, night sweats or weight loss.  He has been on Synthroid for several years.  He had left superior parathyroid gland excision done at [facility] in 2012, benign.  Thyroglossal cyst was removed at age 7.  He stays with his son at home and is independent of ADLs and IADLs.  He worked at [employer] prior to retirement.  Quit smoking more than 30 years ago.  He walks about three fourths of a mile daily.  MEDICAL HISTORY:  Past Medical History:  Diagnosis Date   Diabetes ([place])    METFORMIN    SURGICAL HISTORY: Past Surgical History:  Procedure Laterality Date   CHOLECYSTECTOMY      SOCIAL HISTORY: Social History   Socioeconomic History   Marital status: Widowed    Spouse name: Not on file   Number of children: Not on file   Years of education: Not on file   Highest education level: Not on file  Occupational History   Not on file  Tobacco Use   Smoking status: Never   Smokeless tobacco: Never  Substance and Sexual Activity   Alcohol use: Not on file   Drug use: Not on file   Sexual activity: Not on file  Other Topics Concern   Not on file  Social History Narrative   Not on file   Social Determinants of Health   Financial Resource Strain: Not on file  Food Insecurity: Not on file  Transportation Needs: Not on file   Physical Activity: Not on file  Stress: Not on file  Social Connections: Not on file  Intimate Partner Violence: Not on file    FAMILY HISTORY: No family history on file.  ALLERGIES:  is allergic to penicillins.  MEDICATIONS:  Current Outpatient Medications  Medication Sig Dispense Refill   B-D ULTRAFINE III SHORT PEN 31G X 8 MM MISC Inject into the skin 2 (two) times daily.     canagliflozin (INVOKANA) 100 MG TABS tablet Take by mouth.     ezetimibe (ZETIA) 10 MG tablet Take 10 mg by mouth daily.     FARXIGA 10 MG TABS tablet Take 10 mg by mouth daily.     insulin glargine (LANTUS) 100 UNIT/ML injection Inject 59 Units into the skin 2 (two) times daily.     JANUMET 50-1000 MG tablet Take 1 tablet by mouth 2 (two) times daily.     levothyroxine (SYNTHROID) 50 MCG tablet Take 50 mcg by mouth daily before breakfast.     lisinopril (ZESTRIL) 10 MG tablet Take 10 mg by mouth daily.     metoprolol tartrate (LOPRESSOR) 50 MG tablet Take 50 mg by mouth 2 (two) times daily.     nateglinide (STARLIX) 120 MG tablet Take 120 mg by mouth 3 (three) times daily.     simvastatin (ZOCOR) 20 MG tablet Take 20 mg by mouth daily.  tobramycin (TOBREX) 0.3 % ophthalmic solution SMARTSIG:In Eye(s)     No current facility-administered medications for this visit.    REVIEW OF SYSTEMS:   Constitutional: Denies fevers, chills or abnormal night sweats Eyes: Denies blurriness of vision, double vision or watery eyes Ears, nose, mouth, throat, and face: Denies mucositis or sore throat Respiratory: Denies cough, dyspnea or wheezes Cardiovascular: Denies palpitation, chest discomfort or lower extremity swelling Gastrointestinal:  Denies nausea, heartburn or change in bowel habits Skin: Denies abnormal skin rashes Lymphatics: Denies new lymphadenopathy or easy bruising Neurological:Denies numbness, tingling or new weaknesses Behavioral/Psych: Mood is stable, no new changes  All other systems were  reviewed with the patient and are negative.  PHYSICAL EXAMINATION: ECOG PERFORMANCE STATUS: 1 - Symptomatic but completely ambulatory  Vitals:   05/22/22 0813  BP: 138/83  Pulse: 81  Resp: 18  Temp: 98.3 F (36.8 C)  SpO2: 98%   Filed Weights   05/22/22 0813  Weight: 192 lb 1.6 oz (87.1 kg)    GENERAL:alert, no distress and comfortable SKIN: skin color, texture, turgor are normal, no rashes or significant lesions EYES: normal, conjunctiva are pink and non-injected, sclera clear OROPHARYNX:no exudate, no erythema and lips, buccal mucosa, and tongue normal  NECK: Palpable right-sided neck mass.  No palpable thyroid mass. LYMPH:  no palpable lymphadenopathy in the cervical, axillary or inguinal LUNGS: clear to auscultation and percussion with normal breathing effort HEART: regular rate & rhythm and no murmurs and no lower extremity edema ABDOMEN:abdomen soft, non-tender and normal bowel sounds Musculoskeletal:no cyanosis of digits and no clubbing  PSYCH: alert & oriented x 3 with fluent speech NEURO: no focal motor/sensory deficits  LABORATORY DATA:  I have reviewed the data as listed Lab Results  Component Value Date   WBC 5.9 05/15/2022   HGB 16.1 05/15/2022   HCT 46.7 05/15/2022   MCV 99.4 05/15/2022   PLT 282 05/15/2022     Chemistry      Component Value Date/Time   NA 137 09/15/2010 0414   K 4.3 09/15/2010 0414   CL 109 09/15/2010 0414   CO2 25 09/15/2010 0414   BUN 18 09/15/2010 0414   CREATININE 1.00 04/25/2022 1436      Component Value Date/Time   CALCIUM 10.9 (H) 09/16/2010 1155   CALCIUM 13.5 Result repeated and verified. ([place]) 09/14/2010 1843   ALKPHOS 47 09/15/2010 0414   AST 17 09/15/2010 0414   ALT 14 09/15/2010 0414   BILITOT 0.3 09/15/2010 0414       RADIOGRAPHIC STUDIES: I have personally reviewed the radiological images as listed and agreed with the findings in the report. [place] CORE BIOPSY (SOFT TISSUE)  Result Date:  05/15/2022 INDICATION: 87 year old male with history of right neck mass of indeterminate etiology. EXAM: Ultrasound guided biopsy of right neck mass MEDICATIONS: None. ANESTHESIA/SEDATION: None. FLUOROSCOPY TIME:  None. COMPLICATIONS: None immediate. PROCEDURE: Informed written consent was obtained from the patient after a thorough discussion of the procedural risks, benefits and alternatives. All questions were addressed. Maximal Sterile Barrier Technique was utilized including caps, mask, sterile gowns, sterile gloves, sterile drape, hand hygiene and skin antiseptic. A timeout was performed prior to the initiation of the procedure. Preprocedure ultrasound evaluation demonstrated similar appearing heterogeneously hypoechoic right neck mass measuring approximately 3.7 x 3.3 x 2.7 cm with internal macro calcifications. The procedure was planned. The right neck was prepped and draped in standard fashion. Subdermal Local anesthesia was provided at the planned needle entry site with 1% lidocaine. A small  skin nick was made. Under direct ultrasound visualization, a 17 gauge coaxial introducer needle was directed to the periphery of the mass. Next, a total of 418 gauge core biopsies were obtained. Two samples were placed in formalin and 2 samples were placed in saline. The guide needle was removed. Postprocedure ultrasound evaluation demonstrated no evidence of surrounding hematoma or other complicating features. Hemostasis was achieved at the skin with brief manual compression. A sterile bandage was applied. The patient tolerated the procedure well was discharged home in good condition. IMPRESSION: Technically successful ultrasound-guided core biopsy of right neck mass. Ruthann Cancer, MD [facility] Electronically Signed   By: Ruthann Cancer M.D.   On: 05/15/2022 13:50   MR Brain W Wo Contrast  Result Date: 05/10/2022 CLINICAL DATA:  Provided history: Non-small  cell lung cancer, staging. EXAM: MRI HEAD WITHOUT AND WITH CONTRAST TECHNIQUE: Multiplanar, multiecho pulse sequences of the brain and surrounding structures were obtained without and with intravenous contrast. CONTRAST:  8.63mL GADAVIST GADOBUTROL 1 MMOL/ML IV SOLN COMPARISON:  Neck CT 04/25/2022.  PET-CT 05/03/2022. FINDINGS: Mild intermittent motion degradation. Brain: Mild-to-moderate generalized cerebral atrophy. Mild cerebellar atrophy. Chronic lacunar infarct within the anterior right frontal lobe white matter. There is no acute infarct. No evidence of an intracranial mass. No chronic intracranial blood products. No extra-axial fluid collection. No midline shift. No pathologic intracranial enhancement identified. Vascular: Maintained flow voids within the proximal large arterial vessels. Skull and upper cervical spine: No focal suspicious marrow lesion Sinuses/Orbits: No mass or acute finding within the imaged orbits. Prior bilateral ocular lens replacement. 2.5 cm left maxillary sinus mucous retention cyst. IMPRESSION: 1. No evidence of intracranial metastatic disease. 2. 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 Mild-to-moderate generalized cerebral atrophy. Mild cerebellar atrophy. 4. 2.5 cm left maxillary sinus mucous retention cyst. Electronically Signed   By: Kellie Simmering D.O.   On: 05/10/2022 07:53   NM PET Image Initial (PI) Skull Base To Thigh  Result Date: 05/04/2022 CLINICAL DATA:  Initial treatment strategy for pulmonary nodules. EXAM: NUCLEAR MEDICINE PET SKULL BASE TO THIGH TECHNIQUE: 9.72 mCi F-18 FDG was injected intravenously. Full-ring PET imaging was performed from the skull base to thigh after the radiotracer. CT data was obtained and used for attenuation correction and anatomic localization. Fasting blood glucose: 77 mg/dl COMPARISON:  CT scan 04/25/2022 FINDINGS: Mediastinal blood pool activity: SUV max 2.33 Liver activity: SUV max NA NECK: The  large right neck mass is markedly hypermetabolic with SUV max 38.25 and consistent neoplasm. Recommend biopsy. Adjacent calcified nodes are not hypermetabolic. 10 mm right thyroid nodule is not hypermetabolic and likely benign fusion. No follow-up is necessary. Incidental CT findings: None. CHEST: The ground-glass nodule in the right upper lobe measures approximately 2.3 cm and demonstrates mild hypermetabolism with SUV max of 1.83. Several small solid bilateral pulmonary nodules demonstrate mild hypermetabolism. 9 mm peripheral right upper lobe nodule has an SUV max of 1.59. Adjacent pleural nodule has an SUV max of 2.15. Left upper lobe pleural lesion has an SUV max of 2.90. Right upper lobe pleural lesion on image 45/7 has an SUV max of 2.58. Right lower lobe pulmonary nodule at the right lung base has SUV max of 3.42. No enlarged or hypermetabolic mediastinal or hilar lymph nodes. No axillary adenopathy. Incidental CT findings: Benign left-sided pleural lipoma. Stable aortic and coronary artery calcifications. Large hiatal hernia. ABDOMEN/PELVIS: No findings suspicious for osseous metastatic disease. There is moderate  hypermetabolism throughout the colon which could be due to patient recently ED or taking insulin. Incidental CT findings: Vascular calcifications. Status post cholecystectomy. Colonic diverticulosis. Enlarged prostate gland. SKELETON: No findings for osseous metastatic disease. FDG uptake is noted in the subcutaneous fat overlying the anterior aspect of the mid thighs bilaterally. There appears to be inflammatory changes in the subcutaneous fat and this could be related to recent trauma or injections. Incidental CT findings: None. IMPRESSION: 1. Large right neck mass is markedly hypermetabolic and consistent with a neoplastic process. Recommend biopsy. 2. Numerous pulmonary and pleural nodules demonstrating mild hypermetabolism and worrisome for metastatic disease. 3. No findings for  abdominal/pelvic metastatic disease or osseous metastatic disease. Electronically Signed   By: Marijo Sanes M.D.   On: 05/04/2022 18:00    ASSESSMENT:  1.  Follicular thyroid cancer: - CT soft tissue neck (04/25/2022): 4 cm irregular mass in the right jugular chain, likely metastatic nodes, no primary seen. - CT chest (04/27/2022): Numerous solid lung nodules seen in the lower lungs, new when compared to prior CT from 2015.  Groundglass nodule of the right upper lobe is increased in size compared to 2015.  - PET scan (05/03/2022): Right upper lobe groundglass nodule 2.3 cm SUV 1.8.  Subtle small solid bilateral lung nodules demonstrate mild hypermetabolic some.  9 mm peripheral right upper lobe nodule SUV 1.59.  Pleural nodule with SUV 2.15.  Left upper lobe pleural lesion SUV 2.9.  Large right neck mass hypermetabolic SUV 99.24.  Sent calcified nodes are not hypermetabolic. - MRI brain (2/68/3419): No evidence of intracranial metastatic disease. - Right neck mass biopsy (05/15/2022): Moderately differentiated carcinoma consistent with thyroid origin.  Cells positive for TTF-1, PAX8, Napsin, CK7, CK8/18 and thyroglobulin.  Calcitonin, synaptophysin and chromogranin are negative favoring a follicular cell origin.  2.  Social/family history: - Lives at home with his son.  He walks about three fourths of a mile per day and is independent of all ADLs and IADLs.  He worked at [employer] prior to retirement.  Quit smoking more than 30 years ago. - Mother had lymph node cancer.  Father had stomach cancer.  Paternal first cousin had brain cancer.   PLAN:  1.  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 - We discussed about the pathology report from right neck mass biopsy. - Recommend thyroid ultrasound and detailed neck ultrasound. - Recommend TSH, thyroglobulin and thyroglobulin  antibodies. - Recommend surgical evaluation by Dr. Harlow Asa at [place].   Orders Placed This Encounter  Procedures   TSH    Standing Status:   Future    Number of Occurrences:   1    Standing Expiration Date:   05/22/2023   T3, free    Standing Status:   Future    Number of Occurrences:   1    Standing Expiration Date:   05/23/2023   Thyroglobulin Level    Standing Status:   Future    Number of Occurrences:   1    Standing Expiration Date:   05/23/2023   Thyroglobulin antibody    Standing Status:   Future    Number of Occurrences:   1    Standing Expiration Date:   05/23/2023   T4, free    Standing Status:   Future    Number of Occurrences:   1  Standing Expiration Date:   05/23/2023    All questions were answered. The patient knows to call the clinic with any problems, questions or concerns.      Derek Jack, MD 05/29/2022 4:32 PM

## 2022-05-23 LAB — THYROGLOBULIN ANTIBODY: Thyroglobulin Antibody: 1174.6 IU/mL — ABNORMAL HIGH (ref 0.0–0.9)

## 2022-05-24 LAB — T3, FREE: T3, Free: 2.9 pg/mL (ref 2.0–4.4)

## 2022-05-30 ENCOUNTER — Other Ambulatory Visit: Payer: Self-pay

## 2022-05-30 ENCOUNTER — Encounter: Payer: Self-pay | Admitting: Lab

## 2022-05-30 DIAGNOSIS — C73 Malignant neoplasm of thyroid gland: Secondary | ICD-10-CM

## 2022-05-30 DIAGNOSIS — C3490 Malignant neoplasm of unspecified part of unspecified bronchus or lung: Secondary | ICD-10-CM

## 2022-05-30 LAB — THYROGLOBULIN LEVEL: Thyroglobulin: 11 ng/mL

## 2022-05-30 NOTE — Progress Notes (Signed)
Orders placed for US thyroid and detailed neck US per Dr. Delton Coombes.

## 2022-05-30 NOTE — Progress Notes (Unsigned)
Referral sent to [facility] Dr Tera Helper.   Records faxed on 10/11

## 2022-06-04 ENCOUNTER — Ambulatory Visit (HOSPITAL_COMMUNITY)
Admission: RE | Admit: 2022-06-04 | Discharge: 2022-06-04 | Disposition: A | Discharge status: Home / Self Care | Payer: Medicare Other | Source: Ambulatory Visit | Attending: Hematology | Admitting: Hematology

## 2022-06-04 DIAGNOSIS — E041 Nontoxic single thyroid nodule: Secondary | ICD-10-CM | POA: Diagnosis not present

## 2022-06-04 DIAGNOSIS — C73 Malignant neoplasm of thyroid gland: Secondary | ICD-10-CM | POA: Diagnosis not present

## 2022-06-06 ENCOUNTER — Inpatient Hospital Stay (HOSPITAL_BASED_OUTPATIENT_CLINIC_OR_DEPARTMENT_OTHER): Payer: Medicare Other | Admitting: Hematology

## 2022-06-06 ENCOUNTER — Other Ambulatory Visit: Payer: Self-pay | Admitting: Hematology

## 2022-06-06 VITALS — BP 146/66 | HR 61 | Temp 97.5°F | Resp 18 | Wt 192.5 lb

## 2022-06-06 DIAGNOSIS — R918 Other nonspecific abnormal finding of lung field: Secondary | ICD-10-CM | POA: Diagnosis not present

## 2022-06-06 DIAGNOSIS — C73 Malignant neoplasm of thyroid gland: Secondary | ICD-10-CM

## 2022-06-06 DIAGNOSIS — Z87891 Personal history of nicotine dependence: Secondary | ICD-10-CM | POA: Diagnosis not present

## 2022-06-06 DIAGNOSIS — E119 Type 2 diabetes mellitus without complications: Secondary | ICD-10-CM | POA: Diagnosis not present

## 2022-06-06 NOTE — Patient Instructions (Addendum)
[facility]  Discharge Instructions  You were seen and examined today by Dr. Delton Coombes.  Dr. Delton Coombes discussed your most recent lab work and CT scan which revealed that your antithyroglobulin which is a thyroid component is very high but this is common with thyroid cancer.   Dr. Delton Coombes is going to refer you to Dr. Constance Holster to discuss if he can do your surgery.  Follow-up as scheduled in about 3 months. Dr. Delton Coombes wants to see you about a month after surgery.    Thank you for choosing [facility] to provide your oncology and hematology care.   To afford each patient quality time with our provider, please arrive at least 15 minutes before your scheduled appointment time. You may need to reschedule your appointment if you arrive late (10 or more minutes). Arriving late affects you and other patients whose appointments are after yours.  Also, if you miss three or more appointments without notifying the office, you may be dismissed from the clinic at the provider's discretion.    Again, thank you for choosing [facility].  Our hope is that these requests will decrease the amount of time that you wait before being seen by our physicians.   If you have a lab appointment with the [facility] please come in thru the Main Entrance and check in at the main information desk.           _____________________________________________________________  Should you have questions after your visit to [facility], please contact our office at [phone] and follow the prompts.  Our office hours are 8:00 a.m. to 4:30 p.m. Monday - Thursday and 8:00 a.m. to 2:30 p.m. Friday.  Please note that voicemails left after 4:00 p.m. may not be returned until the following business day.  We are closed weekends and all major holidays.  You do have access to a nurse 24-7, just call the main number to the clinic [phone] and do  not press any options, hold on the line and a nurse will answer the phone.    For prescription refill requests, have your pharmacy contact our office and allow 72 hours.    Masks are optional in the cancer centers. If you would like for your care team to wear a mask while they are taking care of you, please let them know. You may have one support person who is at least 85 years old accompany you for your appointments.

## 2022-06-06 NOTE — Progress Notes (Signed)
Dr. Harlow Asa has reached out to me today and has recommended consultation with head and neck surgeon for this patient for thyroidectomy and modified neck dissection.  I will reach out to ENT group in [facility] or [facility].

## 2022-06-06 NOTE — Progress Notes (Signed)
Jeremy Ferrell, [place] [address]   CLINIC:  Medical Oncology/Hematology  PCP:  Sharilyn Sites, [address] [address] [phone]   REASON FOR VISIT:  Follow-up for follicular thyroid cancer.  PRIOR THERAPY: Right neck mass biopsy on 05/15/2022  NGS Results: Not done  CURRENT THERAPY: Under work-up  BRIEF ONCOLOGIC HISTORY:  Oncology History   No history exists.    CANCER STAGING: Cancer Staging  No matching staging information was found for the patient.   INTERVAL HISTORY:  Jeremy Ferrell 87 y.o. male seen for follow-up of follicular thyroid cancer.  He is accompanied by his 2 sons today.  Denies any dysphagia or odynophagia.  No pain in the neck region reported.  Energy levels are 100%.    REVIEW OF SYSTEMS:  Review of Systems  All other systems reviewed and are negative.    PAST MEDICAL/SURGICAL HISTORY:  Past Medical History:  Diagnosis Date   Diabetes ([place])    METFORMIN   Past Surgical History:  Procedure Laterality Date   CHOLECYSTECTOMY       SOCIAL HISTORY:  Social History   Socioeconomic History   Marital status: Widowed    Spouse name: Not on file   Number of children: Not on file   Years of education: Not on file   Highest education level: Not on file  Occupational History   Not on file  Tobacco Use   Smoking status: Never   Smokeless tobacco: Never  Substance and Sexual Activity   Alcohol use: Not on file   Drug use: Not on file   Sexual activity: Not on file  Other Topics Concern   Not on file  Social History Narrative   Not on file   Social Determinants of Health   Financial Resource Strain: Not on file  Food Insecurity: Not on file  Transportation Needs: Not on file  Physical Activity: Not on file  Stress: Not on file  Social Connections: Not on file  Intimate Partner Violence: Not on file    FAMILY HISTORY:  No family history on file.  CURRENT MEDICATIONS:   Outpatient Encounter Medications as of 06/06/2022  Medication Sig   B-D ULTRAFINE III SHORT PEN 31G X 8 MM MISC Inject into the skin 2 (two) times daily.   canagliflozin (INVOKANA) 100 MG TABS tablet Take by mouth.   ezetimibe (ZETIA) 10 MG tablet Take 10 mg by mouth daily.   FARXIGA 10 MG TABS tablet Take 10 mg by mouth daily.   insulin glargine (LANTUS) 100 UNIT/ML injection Inject 59 Units into the skin 2 (two) times daily.   JANUMET 50-1000 MG tablet Take 1 tablet by mouth 2 (two) times daily.   levothyroxine (SYNTHROID) 50 MCG tablet Take 50 mcg by mouth daily before breakfast.   lisinopril (ZESTRIL) 10 MG tablet Take 10 mg by mouth daily.   metoprolol tartrate (LOPRESSOR) 50 MG tablet Take 50 mg by mouth 2 (two) times daily.   nateglinide (STARLIX) 120 MG tablet Take 120 mg by mouth 3 (three) times daily.   simvastatin (ZOCOR) 20 MG tablet Take 20 mg by mouth daily.   tobramycin (TOBREX) 0.3 % ophthalmic solution SMARTSIG:In Eye(s)   No facility-administered encounter medications on file as of 06/06/2022.    ALLERGIES:  Allergies  Allergen Reactions   Penicillins      PHYSICAL EXAM:  ECOG Performance status: 1  Vitals:   06/06/22 1410  BP: (!) 146/66  Pulse: 61  Resp: 18  Temp: (!) 97.5 F (36.4 C)  SpO2: 98%   Filed Weights   06/06/22 1410  Weight: 192 lb 8 oz (87.3 kg)   Physical Exam Vitals reviewed.  Constitutional:      Appearance: Normal appearance.  Cardiovascular:     Rate and Rhythm: Normal rate and regular rhythm.     Heart sounds: Normal heart sounds.  Pulmonary:     Breath sounds: Normal breath sounds.  Abdominal:     Palpations: Abdomen is soft.  Lymphadenopathy:     Cervical: Cervical adenopathy (Right base of the neck lymph node) present.  Neurological:     Mental Status: He is alert.  Psychiatric:        Mood and Affect: Mood normal.        Behavior: Behavior normal.      LABORATORY DATA:  I have reviewed the labs as listed.   CBC    Component Value Date/Time   WBC 5.9 05/15/2022 1131   RBC 4.70 05/15/2022 1131   HGB 16.1 05/15/2022 1131   HCT 46.7 05/15/2022 1131   PLT 282 05/15/2022 1131   MCV 99.4 05/15/2022 1131   MCH 34.3 (H) 05/15/2022 1131   MCHC 34.5 05/15/2022 1131   RDW 14.1 05/15/2022 1131   LYMPHSABS 1.5 05/15/2022 1131   MONOABS 0.7 05/15/2022 1131   EOSABS 0.2 05/15/2022 1131   BASOSABS 0.1 05/15/2022 1131      Latest Ref Rng & Units 04/25/2022    2:36 PM 03/21/2015    9:54 AM 11/19/2013    2:50 PM  CMP  Creatinine 0.61 - 1.24 mg/dL 1.00  1.00  1.30     DIAGNOSTIC IMAGING:  I have independently reviewed the scans and discussed with the patient.  ASSESSMENT:  1.  Follicular thyroid cancer: - CT soft tissue neck (04/25/2022): 4 cm irregular mass in the right jugular chain, likely metastatic nodes, no primary seen. - CT chest (04/27/2022): Numerous solid lung nodules seen in the lower lungs, new when compared to prior CT from 2015.  Groundglass nodule of the right upper lobe is increased in size compared to 2015.  - PET scan (05/03/2022): Right upper lobe groundglass nodule 2.3 cm SUV 1.8.  Subtle small solid bilateral lung nodules demonstrate mild hypermetabolic some.  9 mm peripheral right upper lobe nodule SUV 1.59.  Pleural nodule with SUV 2.15.  Left upper lobe pleural lesion SUV 2.9.  Large right neck mass hypermetabolic SUV 52.77.  Sent calcified nodes are not hypermetabolic. - MRI brain (04/12/2352): No evidence of intracranial metastatic disease. - Right neck mass biopsy (05/15/2022): Moderately differentiated carcinoma consistent with thyroid origin.  Cells positive for TTF-1, PAX8, Napsin, CK7, CK8/18 and thyroglobulin.  Calcitonin, synaptophysin and chromogranin are negative favoring a follicular cell origin.   2.  Social/family history: - Lives at home with his son.  He walks about three fourths of a mile per day and is independent of all ADLs and IADLs.  He worked at [employer] prior to retirement.  Quit smoking more than 30 years ago. - Mother had lymph node cancer.  Father had stomach cancer.  Paternal first cousin had brain cancer.    PLAN:  1.  Metastatic follicular thyroid cancer: - I have reviewed labs which showed elevated thyroglobulin antibody of 1174.  Serum thyroglobulin levels were normal. - Ultrasound thyroid (06/04/2022): Slowly enlarging 1.8 cm nodule in the inferior right thyroid lobe previously 1.1 cm in 2012.  Left thyroid lobe is  not well visualized. - I have discussed this case with Dr. Harlow Asa.  This patient needs thyroidectomy and neck dissection.  This will be likely followed with radioiodine therapy. - We will make a referral to head and neck surgery. - RTC 3 months for follow-up.    Orders placed this encounter:  No orders of the defined types were placed in this encounter.     Derek Jack, MD [facility] [phone]

## 2022-06-15 DIAGNOSIS — C73 Malignant neoplasm of thyroid gland: Secondary | ICD-10-CM | POA: Diagnosis not present

## 2022-06-22 DIAGNOSIS — C73 Malignant neoplasm of thyroid gland: Secondary | ICD-10-CM | POA: Diagnosis not present

## 2022-07-04 DIAGNOSIS — H353232 Exudative age-related macular degeneration, bilateral, with inactive choroidal neovascularization: Secondary | ICD-10-CM | POA: Diagnosis not present

## 2022-07-04 DIAGNOSIS — H43392 Other vitreous opacities, left eye: Secondary | ICD-10-CM | POA: Diagnosis not present

## 2022-07-04 DIAGNOSIS — E113293 Type 2 diabetes mellitus with mild nonproliferative diabetic retinopathy without macular edema, bilateral: Secondary | ICD-10-CM | POA: Diagnosis not present

## 2022-07-04 DIAGNOSIS — H43812 Vitreous degeneration, left eye: Secondary | ICD-10-CM | POA: Diagnosis not present

## 2022-07-09 DIAGNOSIS — E109 Type 1 diabetes mellitus without complications: Secondary | ICD-10-CM | POA: Diagnosis not present

## 2022-07-09 DIAGNOSIS — E663 Overweight: Secondary | ICD-10-CM | POA: Diagnosis not present

## 2022-07-09 DIAGNOSIS — E782 Mixed hyperlipidemia: Secondary | ICD-10-CM | POA: Diagnosis not present

## 2022-07-09 DIAGNOSIS — E039 Hypothyroidism, unspecified: Secondary | ICD-10-CM | POA: Diagnosis not present

## 2022-07-09 DIAGNOSIS — I10 Essential (primary) hypertension: Secondary | ICD-10-CM | POA: Diagnosis not present

## 2022-07-09 DIAGNOSIS — C73 Malignant neoplasm of thyroid gland: Secondary | ICD-10-CM | POA: Diagnosis not present

## 2022-07-09 DIAGNOSIS — Z6828 Body mass index (BMI) 28.0-28.9, adult: Secondary | ICD-10-CM | POA: Diagnosis not present

## 2022-07-13 ENCOUNTER — Ambulatory Visit: Payer: Self-pay | Admitting: Otolaryngology

## 2022-07-13 NOTE — Pre-Procedure Instructions (Addendum)
Surgical Instructions    Your procedure is scheduled on Monday, December 4th.  Report to [facility] Main Entrance "A" at 10:15 A.M., then check in with the Admitting office.  Call this number if you have problems the morning of surgery:  [phone]   If you have any questions prior to your surgery date call [phone]: Open Monday-Friday 8am-4pm    Remember:  Do not eat or drink after midnight the night before your surgery   Take these medicines the morning of surgery with A SIP OF WATER  levothyroxine (SYNTHROID)  metoprolol tartrate (LOPRESSOR)   WHAT DO I DO ABOUT MY DIABETES MEDICATION?  HOLD FARXIGA for 72 hours (3 days) prior to surgery. Last dose should be, Thursday, 11/30.   Do not take nateglinide (STARLIX) and JANUMET the morning of surgery.  THE NIGHT BEFORE SURGERY, take 30 units of insulin glargine (LANTUS) insulin.      THE MORNING OF SURGERY, take 30 units of insulin glargine (LANTUS) insulin.   HOW TO MANAGE YOUR DIABETES BEFORE AND AFTER SURGERY  Why is it important to control my blood sugar before and after surgery? Improving blood sugar levels before and after surgery helps healing and can limit problems. A way of improving blood sugar control is eating a healthy diet by:  Eating less sugar and carbohydrates  Increasing activity/exercise  Talking with your doctor about reaching your blood sugar goals High blood sugars (greater than 180 mg/dL) can raise your risk of infections and slow your recovery, so you will need to focus on controlling your diabetes during the weeks before surgery. Make sure that the doctor who takes care of your diabetes knows about your planned surgery including the date and location.  How do I manage my blood sugar before surgery? Check your blood sugar at least 4 times a day, starting 2 days before surgery, to make sure that the level is not too high or low.  Check your blood sugar the morning of your surgery when you wake up  and every 2 hours until you get to the Short Stay unit.  If your blood sugar is less than 70 mg/dL, you will need to treat for low blood sugar: Do not take insulin. Treat a low blood sugar (less than 70 mg/dL) with  cup of clear juice (cranberry or apple), 4 glucose tablets, OR glucose gel. Recheck blood sugar in 15 minutes after treatment (to make sure it is greater than 70 mg/dL). If your blood sugar is not greater than 70 mg/dL on recheck, call [phone] for further instructions. Report your blood sugar to the short stay nurse when you get to Short Stay.  If you are admitted to the hospital after surgery: Your blood sugar will be checked by the staff and you will probably be given insulin after surgery (instead of oral diabetes medicines) to make sure you have good blood sugar levels. The goal for blood sugar control after surgery is 80-180 mg/dL.  As of today, STOP taking any Aspirin (unless otherwise instructed by your surgeon) Aleve, Naproxen, Ibuprofen, Motrin, Advil, Goody's, BC's, all herbal medications, fish oil, and all vitamins.                     Do NOT Smoke (Tobacco/Vaping) for 24 hours prior to your procedure.  If you use a CPAP at night, you may bring your mask/headgear for your overnight stay.   Contacts, glasses, piercing's, hearing aid's, dentures or partials may not be worn into  surgery, please bring cases for these belongings.    For patients admitted to the hospital, discharge time will be determined by your treatment team.   Patients discharged the day of surgery will not be allowed to drive home, and someone needs to stay with them for 24 hours.  SURGICAL WAITING ROOM VISITATION Patients having surgery or a procedure may have no more than 2 support people in the waiting area - these visitors may rotate.   Children under the age of 88 must have an adult with them who is not the patient. If the patient needs to stay at the hospital during part of their  recovery, the visitor guidelines for inpatient rooms apply. Pre-op nurse will coordinate an appropriate time for 1 support person to accompany patient in pre-op.  This support person may not rotate.   Please refer to the [facility] website for the visitor guidelines for Inpatients (after your surgery is over and you are in a regular room).    Special instructions:   [place]- Preparing For Surgery  Before surgery, you can play an important role. Because skin is not sterile, your skin needs to be as free of germs as possible. You can reduce the number of germs on your skin by washing with CHG (chlorahexidine gluconate) Soap before surgery.  CHG is an antiseptic cleaner which kills germs and bonds with the skin to continue killing germs even after washing.    Oral Hygiene is also important to reduce your risk of infection.  Remember - BRUSH YOUR TEETH THE MORNING OF SURGERY WITH YOUR REGULAR TOOTHPASTE  Please do not use if you have an allergy to CHG or antibacterial soaps. If your skin becomes reddened/irritated stop using the CHG.  Do not shave (including legs and underarms) for at least 48 hours prior to first CHG shower. It is OK to shave your face.  Please follow these instructions carefully.   Shower the NIGHT BEFORE SURGERY and the MORNING OF SURGERY  If you chose to wash your hair, wash your hair first as usual with your normal shampoo.  After you shampoo, rinse your hair and body thoroughly to remove the shampoo.  Use CHG Soap as you would any other liquid soap. You can apply CHG directly to the skin and wash gently with a scrungie or a clean washcloth.   Apply the CHG Soap to your body ONLY FROM THE NECK DOWN.  Do not use on open wounds or open sores. Avoid contact with your eyes, ears, mouth and genitals (private parts). Wash Face and genitals (private parts)  with your normal soap.   Wash thoroughly, paying special attention to the area where your surgery will be  performed.  Thoroughly rinse your body with warm water from the neck down.  DO NOT shower/wash with your normal soap after using and rinsing off the CHG Soap.  Pat yourself dry with a CLEAN TOWEL.  Wear CLEAN PAJAMAS to bed the night before surgery  Place CLEAN SHEETS on your bed the night before your surgery  DO NOT SLEEP WITH PETS.   Day of Surgery: Take a shower with CHG soap. Do not wear jewelry. Do not wear lotions, powders, colognes, or deodorant. Men may shave face and neck. Do not bring valuables to the hospital.  [facility] is not responsible for any belongings or valuables. Wear Clean/Comfortable clothing the morning of surgery Remember to brush your teeth WITH YOUR REGULAR TOOTHPASTE.   Please read over the following fact sheets  that you were given.    If you received a COVID test during your pre-op visit  it is requested that you wear a mask when out in public, stay away from anyone that may not be feeling well and notify your surgeon if you develop symptoms. If you have been in contact with anyone that has tested positive in the last 10 days please notify you surgeon.

## 2022-07-13 NOTE — H&P (Signed)
CHIEF COMPLAINT: Mass on right side of neck and throat  HISTORY OF PRESENT ILLNESS: Jeremy Ferrell is a 87 y.o. year old male who presents as a referral from oncology for surgical evaluation of metastatic thyroid cancer. Patient states he had a knot on the right side of his neck for approximately 1 year. He is otherwise not had any symptoms. No pain, dysphagia, odynophagia, hoarseness, voice change, dyspnea, weight loss. He does not feel nodules in the left side of his neck. No prior history of thyroid cancer but he states he did have a parathyroidectomy approximately 10 years ago. Not sure what side this was on. The patient has undergone an extensive work-up by his referring doctors including CT imaging, PET/CT and biopsy. 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 Imaging demonstrates bulky right neck adenopathy and a primary tumor in the right thyroid lobe. There is also nodules discussed on the PET CT in the lung which have not been biopsied and the patient was told to be treated with radioactive iodine if suspected as part of the cancer.  The patient is otherwise reasonably healthy. He has no coronary artery disease or prior cardiac history. He does have high blood pressure. No history of COPD or use of oxygen. He does not use blood thinners. No family history of thyroid cancer. No prior neck radiation. Distant tobacco use.  Accompanied by his son today. The patient is interested in curative surgical therapy. Patient also thought he was going to see Dr. Elie Goody today as this was the recommended referral.  No established care with endocrinology.  PAST MEDICAL HISTORY. Past Medical History: Diagnosis Date  Arthritis  Diabetes mellitus ([place])  Hypertension  PAST SURGICAL HISTORY History reviewed. No pertinent surgical history.  ALLERGIES Penicillins  MEDICATIONS  Current Outpatient Medications:  BD ULTRA-FINE SHORT PEN NEEDLE 31  gauge x 5/16" Ndle, Inject into the skin., Disp: , Rfl:  ezetimibe (ZETIA) 10 mg tablet, Take 1 tablet (10 mg total) by mouth daily., Disp: , Rfl:  FARXIGA 10 mg tablet, Take 1 tablet (10 mg total) by mouth daily., Disp: , Rfl:  insulin glargine (LANTUS) 100 unit/mL injection, Inject 59 Units into the skin., Disp: , Rfl:  JANUMET 50-1,000 mg per tablet, Take by mouth., Disp: , Rfl:  levothyroxine (SYNTHROID) 50 MCG tablet, Take 1 tablet (50 mcg total) by mouth., Disp: , Rfl:  lisinopriL (PRINIVIL,ZESTRIL) 10 MG tablet, Take 1 tablet (10 mg total) by mouth daily., Disp: , Rfl:  metoPROLOL tartrate (LOPRESSOR) 50 MG tablet, Take 1 tablet (50 mg total) by mouth., Disp: , Rfl:  nateglinide (STARLIX) 120 MG tablet, Take 1 tablet (120 mg total) by mouth., Disp: , Rfl:  simvastatin (ZOCOR) 20 MG tablet, Take 1 tablet (20 mg total) by mouth daily., Disp: , Rfl:  tamsulosin (FLOMAX) 0.4 mg Cap capsule, , Disp: , Rfl:  tobramycin (TOBREX) 0.3 % ophthalmic solution, SMARTSIG:In Eye(s), Disp: , Rfl:  SOCIAL HISTORY: Social History  Tobacco Use  Smoking status: Former Packs/day: 0.50 Years: 20.00 Additional pack years: 0.00 Total pack years: 10.00 Types: Cigarettes  Smokeless tobacco: Never Substance Use Topics  Alcohol use: Never  FAMILY HISTORY History reviewed. No pertinent family history.  Review Of Systems: All systems were reviewed and were negative except for those mentioned in the HPI.  Past medical history, past surgical history, drug allergies, medications, and pertinent social and family medical history were reviewed with the patient, and details of these were provided are  in the Otolaryngology patient questionnaire. This was reviewed and verified today.  PHYSICAL EXAMINATION: BP 138/65  Pulse 72  Temp 97.5 F (36.4 C) (Temporal)  Ht 1.816 m (5' 11.5")  Wt 86.2 kg (190 lb)  BMI 26.13 kg/m General/Constitutional: Patient is a well-nourished, well-developed male appearing  stated age in no acute distress. Voice quality is good.  Psych: Alert & oriented to time, place and person. Answers questions appropriately.  Skin/scalp : Normal and without lesions. No rashes, ulcerations or masses noted.  Head: Normocephalic. No asymmetries noted. No tenderness to palpation of the soft tissue or bony structures of the face or skull.  Eyes: Normal extraocular motions without diplopia. Pupils equal round and reactive to light. No nystagmus noted.  ENT: Ears: Right Ear: Normal pinna, EAC clear; TM intact, no perforation, no effusion, no global retraction, no retraction pockets. Left Ear: Normal pinna, EAC clear; TM intact, no perforation, no effusion, no global retraction, no retraction pockets.  Nose and Sinuses: No paranasal sinus tenderness. Mucosa without lesions. No polyps are seen. Septum midline and straight.  Mouth: Teeth, gums, tongue, floor of the mouth, retromolar trigone, buccal mucosa, hard palate - normal without lesions. Normal jaw opening. Occlusion - normal.  Oropharynx: Tonsillar fossae, pharyngeal walls, base of tongue - normal. No bleeding or masses.  Neck/Musculoskeletal: Right lower neck level for bulky adenopathy fixed and immobile. Prior transverse lower neck scar from prior parathyroidectomy well-healed. Low-lying larynx.  Cranial Nerves/Neuro: Function II through XII - normal. No focal deficits noted.  Flexible Fiberoptic Endoscopy (CPT [id number]): Indication for procedure: Flexible laryngoscopy was indicated for thyroid cancer After verbal consent was obtained, 4% lidocaine and oxymetazoline was sprayed into the patient's bilateral nasal cavities. A flexible fiberoptic laryngoscope was passed through the patient's naris down to the level of the cords. Nasal cavity: The septum is midline. The nasal cavity is normal. There are no masses, lesions, or polyps. There is no epistaxis or discharge. Nasopharynx: The eustachian tube orifice and fossa of  Rossenmuller are normal. There are no masses or lesions. Oropharynx & Hypopharynx: The base of tongue, vallecula, pyriform sinuses, and post-cricoid area are normal. There are no masses or lesions. There is no pooling of secretions. Larynx: The a-e folds, arytenoids, false cords are normal. The vocal cords were mobile bilaterally with abduction and good adduction. There are no masses or lesions. The patient has good sensation with a strong gag reflex. Subglottis: Evaluation of the subglottis on flexible nasopharyngoscopy in an awake patient can be unreliable, but the subglottis was visualized below the cords and the trachea appeared normal with no evidence of stenosis.  IMAGING/RESULTS - Reviewed in detail as follows: US Thyroid 06/04/22 IMPRESSION: Thyroid ultrasound:  1. There is a slowly enlarging 1.8 cm TR 3 nodule in the inferior right thyroid lobe, previously measuring 1.1 cm in 2012. Given concern for metastatic thyroid cancer on recent neck biopsy, this nodule is amenable for percutaneous biopsy if indicated. No other nodules are identified for biopsy. 2. The left thyroid lobe is not well visualized on this exam, and is noted to be asymmetrically smaller on recent comparison CT neck.  Soft tissue ultrasound head/neck:  1. Similar appearance of recently biopsied partially calcified mass in the right neck.  The above is in keeping with the ACR TI-RADS recommendations - J Am Coll Radiol 2017;14:587-595.  Electronically Signed   By: Albin Felling M.D.   On: 06/05/2022 08:29  CT neck with contrast 04/25/22 FINDINGS: Pharynx and larynx: No evidence of  mass or inflammation.  Salivary glands: No inflammation, mass, or stone.  Thyroid: 12 mm right thyroid nodule. No followup recommended (ref: J Am Coll Radiol. 2015 Feb;12(2): 143-50).  Lymph nodes: Conglomerate of irregular partially calcified enlarged lymph nodes in the right mid to lower jugular chain, in total measuring up  to 4.2 cm. These have a malignant appearance as from metastatic disease with size and irregularity implying extracapsular tumor.  Vascular: Scattered atheromatous calcification  Limited intracranial: Negative.  Visualized orbits: Negative.  Mastoids and visualized paranasal sinuses: Clear.  Skeleton: Ordinary degenerative changes in the cervical spine.  Chronic fracture through the midline hyoid.  Upper chest: Ground-glass nodule in the right upper lobe that is new from 2015 and measures approximately 2.3 cm. Multiple small subpleural nodules in the bilateral upper lungs.  These results will be called to the ordering clinician or representative by the Radiologist Assistant, and communication documented in the PACS or [employer].  IMPRESSION: 1. ~4 cm irregular mass in the right jugular chain, likely metastatic nodes. No primary tumor is seen, recommend ENT referral. 2. Ground-glass nodule in the right upper lobe measuring 2.3 cm. Additional smaller subpleural nodules. Recommend full chest CT given the above.  Electronically Signed   By: Jorje Guild M.D.   On: 04/27/2022 04:34  PET CT 05/03/22  IMPRESSION: 1. Large right neck mass is markedly hypermetabolic and consistent with a neoplastic process. Recommend biopsy. 2. Numerous pulmonary and pleural nodules demonstrating mild hypermetabolism and worrisome for metastatic disease. 3. No findings for abdominal/pelvic metastatic disease or osseous metastatic disease.  Electronically Signed   By: Marijo Sanes M.D.   On: 05/04/2022 18:00  MRI Brain w/wo contrast 05/09/22  IMPRESSION: 1. No evidence of intracranial metastatic disease. 2. 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 Mild-to-moderate generalized cerebral atrophy. Mild cerebellar atrophy. 4. 2.5 cm left maxillary sinus mucous retention cyst.  Electronically Signed   By: Kellie Simmering D.O.   On: 05/10/2022  07:53  Surgical pathology 05/17/22 FINAL MICROSCOPIC DIAGNOSIS:  A. SOFT TISSUE, RIGHT NECK MASS, NEEDLE CORE BIOPSY: -  Moderately differentiated carcinoma consistent with thyroid origin.  Note: The needle core consists of cords/trabeculae of cells with abundant eosinophilic cytoplasm with a desmoplastic/stromal response which focally has dense collagen like material.  The cells are positive for TTF-1, PAX8, Napsin, CK7, CK8/18 and thyroglobulin.  This pattern of immunohistochemical staining is consistent with thyroid origin.  While a follicular cell origin is favored, to fully exclude a medullary carcinoma calcitonin, Congo red and neuroendocrine stains are pending and will be reported in an addendum.  Additional immunohistochemical stains reviewed during the workup of the case which are negative include: CK20, PSA, prostein, CDX2, HepPar, arginase, glypican-3.  Dr. Donneta Romberg has peer reviewed the case and agrees with the interpretation  ADDENDUM: Additional immunohistochemical stains (calcitonin, synaptophysin and chromogranin) are negative favoring a follicular cell origin as opposed to a C-cell origin.  A Congo red stain is noncontributory due to loss of material.  MEDICAL DECISION MAKING IMPRESSION: [id number] Stage IVb Follicular thyroid carcinoma. Staging extrapolated from PETCT - no biopsy of lung and pleural nodules.  1. Follicular thyroid cancer ([place]) 2. Primary cancer of thyroid with metastasis to other site [facility])  RECOMMENDATIONS:  I discussed that the patient has advanced metastatic thyroid cancer. It is concerning that the patient has metastasis to his lungs and pleura. However referring oncologist and surgical oncologist has recommended definitive surgical excision. Patient does not yet have an  endocrinologist and I placed referral for this today. I discussed that definitive surgical excision would improve local regional control of the patient's thyroid cancer and that  metastatic disease can also be treated with radioactive iodine. The complexity of this patient's care may benefit from endocrine tumor conference.  I have discussed the surgery in detail with the patient including all the risks of thyroidectomy surgery and neck dissection surgery. I have also counseled the patient that due to the concern for extranodal extension of his right level 4 lymph node I have concerns about its invasion into the internal jugular vein and deeper neck structures. I have discussed this case with my senior partner Dr. Elie Goody and have offered to transfer care to Dr. Constance Holster as his primary surgeon. The patient was amenable and grateful for the counseling provided today and for the transfer of care. I arranged for follow-up with Dr. Constance Holster in 1 week.

## 2022-07-16 ENCOUNTER — Encounter (HOSPITAL_COMMUNITY): Payer: Self-pay

## 2022-07-16 ENCOUNTER — Other Ambulatory Visit: Payer: Self-pay

## 2022-07-16 ENCOUNTER — Encounter (HOSPITAL_COMMUNITY)
Admission: RE | Admit: 2022-07-16 | Discharge: 2022-07-16 | Disposition: A | Discharge status: Home / Self Care | Payer: Medicare Other | Source: Ambulatory Visit | Attending: Otolaryngology | Admitting: Otolaryngology

## 2022-07-16 VITALS — HR 77 | Temp 97.5°F | Resp 18 | Ht 71.0 in | Wt 191.4 lb

## 2022-07-16 DIAGNOSIS — Z794 Long term (current) use of insulin: Secondary | ICD-10-CM | POA: Diagnosis not present

## 2022-07-16 DIAGNOSIS — Z01818 Encounter for other preprocedural examination: Secondary | ICD-10-CM | POA: Insufficient documentation

## 2022-07-16 DIAGNOSIS — E119 Type 2 diabetes mellitus without complications: Secondary | ICD-10-CM | POA: Diagnosis not present

## 2022-07-16 DIAGNOSIS — I10 Essential (primary) hypertension: Secondary | ICD-10-CM | POA: Insufficient documentation

## 2022-07-16 HISTORY — DX: Essential (primary) hypertension: I10

## 2022-07-16 LAB — CBC
HCT: 48.9 % (ref 39.0–52.0)
Hemoglobin: 16.5 g/dL (ref 13.0–17.0)
MCH: 33.8 pg (ref 26.0–34.0)
MCHC: 33.7 g/dL (ref 30.0–36.0)
MCV: 100.2 fL — ABNORMAL HIGH (ref 80.0–100.0)
Platelets: 307 10*3/uL (ref 150–400)
RBC: 4.88 MIL/uL (ref 4.22–5.81)
RDW: 13.7 % (ref 11.5–15.5)
WBC: 8.9 10*3/uL (ref 4.0–10.5)
nRBC: 0 % (ref 0.0–0.2)

## 2022-07-16 LAB — BASIC METABOLIC PANEL
Anion gap: 9 (ref 5–15)
BUN: 33 mg/dL — ABNORMAL HIGH (ref 8–23)
CO2: 23 mmol/L (ref 22–32)
Calcium: 9.7 mg/dL (ref 8.9–10.3)
Chloride: 108 mmol/L (ref 98–111)
Creatinine, Ser: 1.11 mg/dL (ref 0.61–1.24)
GFR, Estimated: >60 mL/min (ref >60)
Glucose, Bld: 99 mg/dL (ref 70–99)
Potassium: 4.6 mmol/L (ref 3.5–5.1)
Sodium: 140 mmol/L (ref 135–145)

## 2022-07-16 LAB — GLUCOSE, CAPILLARY: Glucose-Capillary: 71 mg/dL (ref 70–99)

## 2022-07-16 NOTE — Progress Notes (Signed)
PCP - Dr. Sharilyn Sites Cardiologist - denies  PPM/ICD - n/a  Chest x-ray - n/a EKG - 07/16/22 Stress Test - denies ECHO - denies Cardiac Cath - denies  Sleep Study - denies CPAP - denies  CBG at PAT 73.  Checks Blood Sugar maybe 1 time a month.  Will obtain A1C today  Last dose of GLP1 agonist-  n/a GLP1 instructions: n/a  Blood Thinner Instructions: n/a Aspirin Instructions: n/a  NPO  COVID TEST- n/a  Anesthesia review: Yes, EKG review.  Patient denies shortness of breath, fever, cough and chest pain at PAT appointment   All instructions explained to the patient, with a verbal understanding of the material. Patient agrees to go over the instructions while at home for a better understanding. Patient also instructed to self quarantine after being tested for COVID-19. The opportunity to ask questions was provided.

## 2022-07-17 LAB — HEMOGLOBIN A1C
Hgb A1c MFr Bld: 6.3 % — ABNORMAL HIGH (ref 4.8–5.6)
Mean Plasma Glucose: 134 mg/dL

## 2022-07-17 NOTE — H&P (Signed)
CHIEF COMPLAINT: Mass on right side of neck and throat  HISTORY OF PRESENT ILLNESS: Jeremy Ferrell is a 87 y.o. year old male who presents as a referral from oncology for surgical evaluation of metastatic thyroid cancer. Patient states he had a knot on the right side of his neck for approximately 1 year. He is otherwise not had any symptoms. No pain, dysphagia, odynophagia, hoarseness, voice change, dyspnea, weight loss. He does not feel nodules in the left side of his neck. No prior history of thyroid cancer but he states he did have a parathyroidectomy approximately 10 years ago. Not sure what side this was on. The patient has undergone an extensive work-up by his referring doctors including CT imaging, PET/CT and biopsy. 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 Imaging demonstrates bulky right neck adenopathy and a primary tumor in the right thyroid lobe. There is also nodules discussed on the PET CT in the lung which have not been biopsied and the patient was told to be treated with radioactive iodine if suspected as part of the cancer.  The patient is otherwise reasonably healthy. He has no coronary artery disease or prior cardiac history. He does have high blood pressure. No history of COPD or use of oxygen. He does not use blood thinners. No family history of thyroid cancer. No prior neck radiation. Distant tobacco use.  Accompanied by his son today. The patient is interested in curative surgical therapy. Patient also thought he was going to see Dr. Elie Goody today as this was the recommended referral.  No established care with endocrinology.  PAST MEDICAL HISTORY. Past Medical History: Diagnosis Date  Arthritis  Diabetes mellitus ([place])  Hypertension  PAST SURGICAL HISTORY History reviewed. No pertinent surgical history.  ALLERGIES Penicillins  MEDICATIONS  Current Outpatient Medications:  BD ULTRA-FINE SHORT PEN NEEDLE 31  gauge x 5/16" Ndle, Inject into the skin., Disp: , Rfl:  ezetimibe (ZETIA) 10 mg tablet, Take 1 tablet (10 mg total) by mouth daily., Disp: , Rfl:  FARXIGA 10 mg tablet, Take 1 tablet (10 mg total) by mouth daily., Disp: , Rfl:  insulin glargine (LANTUS) 100 unit/mL injection, Inject 59 Units into the skin., Disp: , Rfl:  JANUMET 50-1,000 mg per tablet, Take by mouth., Disp: , Rfl:  levothyroxine (SYNTHROID) 50 MCG tablet, Take 1 tablet (50 mcg total) by mouth., Disp: , Rfl:  lisinopriL (PRINIVIL,ZESTRIL) 10 MG tablet, Take 1 tablet (10 mg total) by mouth daily., Disp: , Rfl:  metoPROLOL tartrate (LOPRESSOR) 50 MG tablet, Take 1 tablet (50 mg total) by mouth., Disp: , Rfl:  nateglinide (STARLIX) 120 MG tablet, Take 1 tablet (120 mg total) by mouth., Disp: , Rfl:  simvastatin (ZOCOR) 20 MG tablet, Take 1 tablet (20 mg total) by mouth daily., Disp: , Rfl:  tamsulosin (FLOMAX) 0.4 mg Cap capsule, , Disp: , Rfl:  tobramycin (TOBREX) 0.3 % ophthalmic solution, SMARTSIG:In Eye(s), Disp: , Rfl:  SOCIAL HISTORY: Social History  Tobacco Use  Smoking status: Former Packs/day: 0.50 Years: 20.00 Additional pack years: 0.00 Total pack years: 10.00 Types: Cigarettes  Smokeless tobacco: Never Substance Use Topics  Alcohol use: Never  FAMILY HISTORY History reviewed. No pertinent family history.  Review Of Systems: All systems were reviewed and were negative except for those mentioned in the HPI.  Past medical history, past surgical history, drug allergies, medications, and pertinent social and family medical history were reviewed with the patient, and details of these were provided are  in the Otolaryngology patient questionnaire. This was reviewed and verified today.  PHYSICAL EXAMINATION: BP 138/65  Pulse 72  Temp 97.5 F (36.4 C) (Temporal)  Ht 1.816 m (5' 11.5")  Wt 86.2 kg (190 lb)  BMI 26.13 kg/m General/Constitutional: Patient is a well-nourished, well-developed male appearing  stated age in no acute distress. Voice quality is good.  Psych: Alert & oriented to time, place and person. Answers questions appropriately.  Skin/scalp : Normal and without lesions. No rashes, ulcerations or masses noted.  Head: Normocephalic. No asymmetries noted. No tenderness to palpation of the soft tissue or bony structures of the face or skull.  Eyes: Normal extraocular motions without diplopia. Pupils equal round and reactive to light. No nystagmus noted.  ENT: Ears: Right Ear: Normal pinna, EAC clear; TM intact, no perforation, no effusion, no global retraction, no retraction pockets. Left Ear: Normal pinna, EAC clear; TM intact, no perforation, no effusion, no global retraction, no retraction pockets.  Nose and Sinuses: No paranasal sinus tenderness. Mucosa without lesions. No polyps are seen. Septum midline and straight.  Mouth: Teeth, gums, tongue, floor of the mouth, retromolar trigone, buccal mucosa, hard palate - normal without lesions. Normal jaw opening. Occlusion - normal.  Oropharynx: Tonsillar fossae, pharyngeal walls, base of tongue - normal. No bleeding or masses.  Neck/Musculoskeletal: Right lower neck level for bulky adenopathy fixed and immobile. Prior transverse lower neck scar from prior parathyroidectomy well-healed. Low-lying larynx.  Cranial Nerves/Neuro: Function II through XII - normal. No focal deficits noted.  Flexible Fiberoptic Endoscopy (CPT [id number]): Indication for procedure: Flexible laryngoscopy was indicated for thyroid cancer After verbal consent was obtained, 4% lidocaine and oxymetazoline was sprayed into the patient's bilateral nasal cavities. A flexible fiberoptic laryngoscope was passed through the patient's naris down to the level of the cords. Nasal cavity: The septum is midline. The nasal cavity is normal. There are no masses, lesions, or polyps. There is no epistaxis or discharge. Nasopharynx: The eustachian tube orifice and fossa of  [place]uller are normal. There are no masses or lesions. Oropharynx & Hypopharynx: The base of tongue, vallecula, pyriform sinuses, and post-cricoid area are normal. There are no masses or lesions. There is no pooling of secretions. Larynx: The a-e folds, arytenoids, false cords are normal. The vocal cords were mobile bilaterally with abduction and good adduction. There are no masses or lesions. The patient has good sensation with a strong gag reflex. Subglottis: Evaluation of the subglottis on flexible nasopharyngoscopy in an awake patient can be unreliable, but the subglottis was visualized below the cords and the trachea appeared normal with no evidence of stenosis.  IMAGING/RESULTS - Reviewed in detail as follows: US Thyroid 06/04/22 IMPRESSION: Thyroid ultrasound:  1. There is a slowly enlarging 1.8 cm TR 3 nodule in the inferior right thyroid lobe, previously measuring 1.1 cm in 2012. Given concern for metastatic thyroid cancer on recent neck biopsy, this nodule is amenable for percutaneous biopsy if indicated. No other nodules are identified for biopsy. 2. The left thyroid lobe is not well visualized on this exam, and is noted to be asymmetrically smaller on recent comparison CT neck.  Soft tissue ultrasound head/neck:  1. Similar appearance of recently biopsied partially calcified mass in the right neck.  The above is in keeping with the ACR TI-RADS recommendations - J Am Coll Radiol 2017;14:587-595.  Electronically Signed   By: Albin Felling M.D.   On: 06/05/2022 08:29  CT neck with contrast 04/25/22 FINDINGS: Pharynx and larynx: No evidence of  mass or inflammation.  Salivary glands: No inflammation, mass, or stone.  Thyroid: 12 mm right thyroid nodule. No followup recommended (ref: J Am Coll Radiol. 2015 Feb;12(2): 143-50).  Lymph nodes: Conglomerate of irregular partially calcified enlarged lymph nodes in the right mid to lower jugular chain, in total measuring up  to 4.2 cm. These have a malignant appearance as from metastatic disease with size and irregularity implying extracapsular tumor.  Vascular: Scattered atheromatous calcification  Limited intracranial: Negative.  Visualized orbits: Negative.  Mastoids and visualized paranasal sinuses: Clear.  Skeleton: Ordinary degenerative changes in the cervical spine.  Chronic fracture through the midline hyoid.  Upper chest: Ground-glass nodule in the right upper lobe that is new from 2015 and measures approximately 2.3 cm. Multiple small subpleural nodules in the bilateral upper lungs.  These results will be called to the ordering clinician or representative by the Radiologist Assistant, and communication documented in the PACS or [employer].  IMPRESSION: 1. ~4 cm irregular mass in the right jugular chain, likely metastatic nodes. No primary tumor is seen, recommend ENT referral. 2. Ground-glass nodule in the right upper lobe measuring 2.3 cm. Additional smaller subpleural nodules. Recommend full chest CT given the above.  Electronically Signed   By: Jorje Guild M.D.   On: 04/27/2022 04:34  PET CT 05/03/22  IMPRESSION: 1. Large right neck mass is markedly hypermetabolic and consistent with a neoplastic process. Recommend biopsy. 2. Numerous pulmonary and pleural nodules demonstrating mild hypermetabolism and worrisome for metastatic disease. 3. No findings for abdominal/pelvic metastatic disease or osseous metastatic disease.  Electronically Signed   By: Marijo Sanes M.D.   On: 05/04/2022 18:00  MRI Brain w/wo contrast 05/09/22  IMPRESSION: 1. No evidence of intracranial metastatic disease. 2. 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 Mild-to-moderate generalized cerebral atrophy. Mild cerebellar atrophy. 4. 2.5 cm left maxillary sinus mucous retention cyst.  Electronically Signed   By: Kellie Simmering D.O.   On: 05/10/2022  07:53  Surgical pathology 05/17/22 FINAL MICROSCOPIC DIAGNOSIS:  A. SOFT TISSUE, RIGHT NECK MASS, NEEDLE CORE BIOPSY: -  Moderately differentiated carcinoma consistent with thyroid origin.  Note: The needle core consists of cords/trabeculae of cells with abundant eosinophilic cytoplasm with a desmoplastic/stromal response which focally has dense collagen like material.  The cells are positive for TTF-1, PAX8, Napsin, CK7, CK8/18 and thyroglobulin.  This pattern of immunohistochemical staining is consistent with thyroid origin.  While a follicular cell origin is favored, to fully exclude a medullary carcinoma calcitonin, Congo red and neuroendocrine stains are pending and will be reported in an addendum.  Additional immunohistochemical stains reviewed during the workup of the case which are negative include: CK20, PSA, prostein, CDX2, HepPar, arginase, glypican-3.  Dr. Donneta Romberg has peer reviewed the case and agrees with the interpretation  ADDENDUM: Additional immunohistochemical stains (calcitonin, synaptophysin and chromogranin) are negative favoring a follicular cell origin as opposed to a C-cell origin.  A Congo red stain is noncontributory due to loss of material.  MEDICAL DECISION MAKING IMPRESSION: [id number] Stage IVb Follicular thyroid carcinoma. Staging extrapolated from PETCT - no biopsy of lung and pleural nodules.  1. Follicular thyroid cancer ([place]) 2. Primary cancer of thyroid with metastasis to other site [facility])  RECOMMENDATIONS:  I discussed that the patient has advanced metastatic thyroid cancer. It is concerning that the patient has metastasis to his lungs and pleura. However referring oncologist and surgical oncologist has recommended definitive surgical excision. Patient does not yet have an  endocrinologist and I placed referral for this today. I discussed that definitive surgical excision would improve local regional control of the patient's thyroid cancer and that  metastatic disease can also be treated with radioactive iodine. The complexity of this patient's care may benefit from endocrine tumor conference.  I have discussed the surgery in detail with the patient including all the risks of thyroidectomy surgery and neck dissection surgery. I have also counseled the patient that due to the concern for extranodal extension of his right level 4 lymph node I have concerns about its invasion into the internal jugular vein and deeper neck structures. I have discussed this case with my senior partner Dr. Elie Goody and have offered to transfer care to Dr. Constance Holster as his primary surgeon. The patient was amenable and grateful for the counseling provided today and for the transfer of care. I arranged for follow-up with Dr. Constance Holster in 1 week.

## 2022-07-19 DIAGNOSIS — C770 Secondary and unspecified malignant neoplasm of lymph nodes of head, face and neck: Secondary | ICD-10-CM | POA: Diagnosis not present

## 2022-07-19 DIAGNOSIS — D7589 Other specified diseases of blood and blood-forming organs: Secondary | ICD-10-CM | POA: Diagnosis not present

## 2022-07-19 DIAGNOSIS — E538 Deficiency of other specified B group vitamins: Secondary | ICD-10-CM | POA: Diagnosis not present

## 2022-07-19 DIAGNOSIS — R946 Abnormal results of thyroid function studies: Secondary | ICD-10-CM | POA: Diagnosis not present

## 2022-07-19 DIAGNOSIS — C73 Malignant neoplasm of thyroid gland: Secondary | ICD-10-CM | POA: Diagnosis not present

## 2022-07-19 DIAGNOSIS — C7800 Secondary malignant neoplasm of unspecified lung: Secondary | ICD-10-CM | POA: Diagnosis not present

## 2022-07-23 ENCOUNTER — Inpatient Hospital Stay (HOSPITAL_COMMUNITY): Payer: Medicare Other | Admitting: Physician Assistant

## 2022-07-23 ENCOUNTER — Inpatient Hospital Stay (HOSPITAL_COMMUNITY)
Admission: RE | Admit: 2022-07-23 | Discharge: 2022-07-25 | DRG: 626 | Disposition: A | Discharge status: Home / Self Care | Payer: Medicare Other | Attending: Otolaryngology | Admitting: Otolaryngology

## 2022-07-23 ENCOUNTER — Encounter (HOSPITAL_COMMUNITY): Payer: Self-pay | Admitting: Otolaryngology

## 2022-07-23 ENCOUNTER — Encounter (HOSPITAL_COMMUNITY)
Admission: RE | Disposition: A | Discharge status: Home / Self Care | Payer: Self-pay | Source: Home / Self Care | Attending: Otolaryngology

## 2022-07-23 ENCOUNTER — Other Ambulatory Visit: Payer: Self-pay

## 2022-07-23 DIAGNOSIS — C73 Malignant neoplasm of thyroid gland: Secondary | ICD-10-CM

## 2022-07-23 DIAGNOSIS — I10 Essential (primary) hypertension: Secondary | ICD-10-CM

## 2022-07-23 DIAGNOSIS — E119 Type 2 diabetes mellitus without complications: Secondary | ICD-10-CM | POA: Diagnosis present

## 2022-07-23 DIAGNOSIS — Z79890 Hormone replacement therapy (postmenopausal): Secondary | ICD-10-CM | POA: Diagnosis not present

## 2022-07-23 DIAGNOSIS — Z794 Long term (current) use of insulin: Secondary | ICD-10-CM

## 2022-07-23 DIAGNOSIS — C7989 Secondary malignant neoplasm of other specified sites: Secondary | ICD-10-CM

## 2022-07-23 DIAGNOSIS — Z880 Allergy status to penicillin: Secondary | ICD-10-CM

## 2022-07-23 DIAGNOSIS — C770 Secondary and unspecified malignant neoplasm of lymph nodes of head, face and neck: Secondary | ICD-10-CM | POA: Diagnosis present

## 2022-07-23 DIAGNOSIS — Z79899 Other long term (current) drug therapy: Secondary | ICD-10-CM | POA: Diagnosis not present

## 2022-07-23 DIAGNOSIS — Z87891 Personal history of nicotine dependence: Secondary | ICD-10-CM | POA: Diagnosis not present

## 2022-07-23 DIAGNOSIS — E041 Nontoxic single thyroid nodule: Secondary | ICD-10-CM | POA: Diagnosis not present

## 2022-07-23 HISTORY — PX: THYROIDECTOMY: SHX17

## 2022-07-23 HISTORY — PX: RADICAL NECK DISSECTION: SHX2284

## 2022-07-23 LAB — GLUCOSE, CAPILLARY
Glucose-Capillary: 174 mg/dL — ABNORMAL HIGH (ref 70–99)
Glucose-Capillary: 143 mg/dL — ABNORMAL HIGH (ref 70–99)
Glucose-Capillary: 117 mg/dL — ABNORMAL HIGH (ref 70–99)
Glucose-Capillary: 161 mg/dL — ABNORMAL HIGH (ref 70–99)
Glucose-Capillary: 334 mg/dL — ABNORMAL HIGH (ref 70–99)

## 2022-07-23 LAB — CALCIUM: Calcium: 8.7 mg/dL — ABNORMAL LOW (ref 8.9–10.3)

## 2022-07-23 SURGERY — THYROIDECTOMY
Anesthesia: General | Site: Neck | Laterality: Right

## 2022-07-23 MED ORDER — SUCCINYLCHOLINE CHLORIDE 200 MG/10ML IV SOSY
PREFILLED_SYRINGE | INTRAVENOUS | Status: AC
  Filled 2022-07-23: qty 10

## 2022-07-23 MED ORDER — PROPOFOL 10 MG/ML IV BOLUS
INTRAVENOUS | Status: AC
  Filled 2022-07-23: qty 20

## 2022-07-23 MED ORDER — FENTANYL CITRATE (PF) 250 MCG/5ML IJ SOLN
INTRAMUSCULAR | Status: AC
  Filled 2022-07-23: qty 5

## 2022-07-23 MED ORDER — PROPOFOL 10 MG/ML IV BOLUS
INTRAVENOUS | Status: DC | PRN
  Administered 2022-07-23: 20 mg via INTRAVENOUS
  Administered 2022-07-23: 150 mg via INTRAVENOUS
  Administered 2022-07-23: 40 mg via INTRAVENOUS
  Administered 2022-07-23: 30 mg via INTRAVENOUS

## 2022-07-23 MED ORDER — MIDAZOLAM HCL 2 MG/2ML IJ SOLN: 0.5000 mg | Freq: Once | INTRAMUSCULAR | Status: DC | PRN

## 2022-07-23 MED ORDER — DEXAMETHASONE SODIUM PHOSPHATE 10 MG/ML IJ SOLN
INTRAMUSCULAR | Status: AC
  Filled 2022-07-23: qty 1

## 2022-07-23 MED ORDER — EZETIMIBE 10 MG PO TABS
10.0000 mg | ORAL_TABLET | Freq: Every day | ORAL | Status: DC
  Administered 2022-07-23 – 2022-07-24 (×2): 10 mg via ORAL
  Filled 2022-07-23 (×2): qty 1

## 2022-07-23 MED ORDER — ROCURONIUM BROMIDE 10 MG/ML (PF) SYRINGE
PREFILLED_SYRINGE | INTRAVENOUS | Status: AC
  Filled 2022-07-23: qty 10

## 2022-07-23 MED ORDER — LACTATED RINGERS IV SOLN: INTRAVENOUS | Status: DC

## 2022-07-23 MED ORDER — DAPAGLIFLOZIN PROPANEDIOL 10 MG PO TABS
10.0000 mg | ORAL_TABLET | Freq: Every day | ORAL | Status: DC
  Administered 2022-07-24 – 2022-07-25 (×2): 10 mg via ORAL
  Filled 2022-07-23 (×3): qty 1

## 2022-07-23 MED ORDER — ESTER-C PO TABS: 2.0000 | ORAL_TABLET | Freq: Every day | ORAL | Status: DC

## 2022-07-23 MED ORDER — PROMETHAZINE HCL 25 MG/ML IJ SOLN: 6.2500 mg | INTRAMUSCULAR | Status: DC | PRN

## 2022-07-23 MED ORDER — CHLORHEXIDINE GLUCONATE 0.12 % MT SOLN: 15.0000 mL | Freq: Once | OROMUCOSAL | Status: AC

## 2022-07-23 MED ORDER — BACITRACIN ZINC 500 UNIT/GM EX OINT
TOPICAL_OINTMENT | CUTANEOUS | Status: DC | PRN
  Administered 2022-07-23: 1 via TOPICAL

## 2022-07-23 MED ORDER — METFORMIN HCL 500 MG PO TABS
1000.0000 mg | ORAL_TABLET | Freq: Two times a day (BID) | ORAL | Status: DC
  Administered 2022-07-24 – 2022-07-25 (×3): 1000 mg via ORAL
  Filled 2022-07-23 (×3): qty 2

## 2022-07-23 MED ORDER — HYDROCODONE-ACETAMINOPHEN 5-325 MG PO TABS
1.0000 | ORAL_TABLET | ORAL | Status: DC | PRN
  Administered 2022-07-23: 2 via ORAL
  Filled 2022-07-23: qty 2

## 2022-07-23 MED ORDER — ONDANSETRON HCL 4 MG/2ML IJ SOLN: 4.0000 mg | INTRAMUSCULAR | Status: DC | PRN

## 2022-07-23 MED ORDER — LEVOTHYROXINE SODIUM 100 MCG PO TABS
100.0000 ug | ORAL_TABLET | Freq: Every day | ORAL | Status: DC
  Administered 2022-07-24 – 2022-07-25 (×2): 100 ug via ORAL
  Filled 2022-07-23 (×2): qty 1

## 2022-07-23 MED ORDER — INSULIN ASPART 100 UNIT/ML IJ SOLN
0.0000 [IU] | INTRAMUSCULAR | Status: DC | PRN
  Administered 2022-07-23: 2 [IU] via SUBCUTANEOUS
  Filled 2022-07-23: qty 1

## 2022-07-23 MED ORDER — HYDROMORPHONE HCL 1 MG/ML IJ SOLN
INTRAMUSCULAR | Status: AC
  Filled 2022-07-23: qty 1

## 2022-07-23 MED ORDER — CHLORHEXIDINE GLUCONATE 0.12 % MT SOLN
OROMUCOSAL | Status: AC
  Administered 2022-07-23: 15 mL via OROMUCOSAL
  Filled 2022-07-23: qty 15

## 2022-07-23 MED ORDER — LISINOPRIL 10 MG PO TABS
10.0000 mg | ORAL_TABLET | Freq: Every day | ORAL | Status: DC
  Administered 2022-07-23 – 2022-07-25 (×3): 10 mg via ORAL
  Filled 2022-07-23 (×3): qty 1

## 2022-07-23 MED ORDER — FENTANYL CITRATE (PF) 250 MCG/5ML IJ SOLN
INTRAMUSCULAR | Status: DC | PRN
  Administered 2022-07-23: 100 ug via INTRAVENOUS
  Administered 2022-07-23 (×3): 50 ug via INTRAVENOUS

## 2022-07-23 MED ORDER — LIDOCAINE 2% (20 MG/ML) 5 ML SYRINGE
INTRAMUSCULAR | Status: AC
  Filled 2022-07-23: qty 5

## 2022-07-23 MED ORDER — INSULIN GLARGINE-YFGN 100 UNIT/ML ~~LOC~~ SOLN
59.0000 [IU] | Freq: Two times a day (BID) | SUBCUTANEOUS | Status: DC
  Administered 2022-07-23 – 2022-07-24 (×3): 59 [IU] via SUBCUTANEOUS
  Filled 2022-07-23 (×5): qty 0.59

## 2022-07-23 MED ORDER — POLYETHYL GLYCOL-PROPYL GLYCOL 0.4-0.3 % OP SOLN: 1.0000 [drp] | Freq: Two times a day (BID) | OPHTHALMIC | Status: DC

## 2022-07-23 MED ORDER — SIMVASTATIN 20 MG PO TABS
20.0000 mg | ORAL_TABLET | Freq: Every day | ORAL | Status: DC
  Administered 2022-07-23 – 2022-07-24 (×2): 20 mg via ORAL
  Filled 2022-07-23 (×2): qty 1

## 2022-07-23 MED ORDER — ACETAMINOPHEN 500 MG PO TABS
ORAL_TABLET | ORAL | Status: AC
  Administered 2022-07-23: 1000 mg via ORAL
  Filled 2022-07-23: qty 2

## 2022-07-23 MED ORDER — DEXAMETHASONE SODIUM PHOSPHATE 10 MG/ML IJ SOLN
INTRAMUSCULAR | Status: DC | PRN
  Administered 2022-07-23: 8 mg via INTRAVENOUS

## 2022-07-23 MED ORDER — ROCURONIUM BROMIDE 10 MG/ML (PF) SYRINGE
PREFILLED_SYRINGE | INTRAVENOUS | Status: DC | PRN
  Administered 2022-07-23 (×3): 30 mg via INTRAVENOUS

## 2022-07-23 MED ORDER — ONDANSETRON HCL 4 MG/2ML IJ SOLN
INTRAMUSCULAR | Status: AC
  Filled 2022-07-23: qty 2

## 2022-07-23 MED ORDER — ORAL CARE MOUTH RINSE: 15.0000 mL | Freq: Once | OROMUCOSAL | Status: AC

## 2022-07-23 MED ORDER — LIDOCAINE 2% (20 MG/ML) 5 ML SYRINGE
INTRAMUSCULAR | Status: DC | PRN
  Administered 2022-07-23: 60 mg via INTRAVENOUS

## 2022-07-23 MED ORDER — KRILL OIL 500 MG PO CAPS: 500.0000 mg | ORAL_CAPSULE | Freq: Every day | ORAL | Status: DC

## 2022-07-23 MED ORDER — ONDANSETRON HCL 4 MG/2ML IJ SOLN
INTRAMUSCULAR | Status: DC | PRN
  Administered 2022-07-23: 4 mg via INTRAVENOUS

## 2022-07-23 MED ORDER — SITAGLIPTIN PHOS-METFORMIN HCL 50-1000 MG PO TABS: 1.0000 | ORAL_TABLET | Freq: Two times a day (BID) | ORAL | Status: DC

## 2022-07-23 MED ORDER — SUGAMMADEX SODIUM 200 MG/2ML IV SOLN
INTRAVENOUS | Status: DC | PRN
  Administered 2022-07-23: 200 mg via INTRAVENOUS

## 2022-07-23 MED ORDER — OYSTER SHELL CALCIUM/D3 500-5 MG-MCG PO TABS
1.0000 | ORAL_TABLET | Freq: Two times a day (BID) | ORAL | Status: DC
  Administered 2022-07-23 – 2022-07-25 (×4): 1 via ORAL
  Filled 2022-07-23 (×5): qty 1

## 2022-07-23 MED ORDER — PHENYLEPHRINE 80 MCG/ML (10ML) SYRINGE FOR IV PUSH (FOR BLOOD PRESSURE SUPPORT)
PREFILLED_SYRINGE | INTRAVENOUS | Status: DC | PRN
  Administered 2022-07-23 (×5): 80 ug via INTRAVENOUS

## 2022-07-23 MED ORDER — LIDOCAINE-EPINEPHRINE 1 %-1:100000 IJ SOLN
INTRAMUSCULAR | Status: AC
  Filled 2022-07-23: qty 1

## 2022-07-23 MED ORDER — 0.9 % SODIUM CHLORIDE (POUR BTL) OPTIME
TOPICAL | Status: DC | PRN
  Administered 2022-07-23: 1000 mL

## 2022-07-23 MED ORDER — ONDANSETRON HCL 4 MG PO TABS: 4.0000 mg | ORAL_TABLET | ORAL | Status: DC | PRN

## 2022-07-23 MED ORDER — OXYCODONE HCL 5 MG PO TABS: 5.0000 mg | ORAL_TABLET | Freq: Once | ORAL | Status: DC | PRN

## 2022-07-23 MED ORDER — NATEGLINIDE 60 MG PO TABS
120.0000 mg | ORAL_TABLET | Freq: Three times a day (TID) | ORAL | Status: DC
  Administered 2022-07-24 – 2022-07-25 (×4): 120 mg via ORAL
  Filled 2022-07-23 (×5): qty 2

## 2022-07-23 MED ORDER — HYDROMORPHONE HCL 1 MG/ML IJ SOLN
0.2500 mg | INTRAMUSCULAR | Status: DC | PRN
  Administered 2022-07-23: 0.5 mg via INTRAVENOUS
  Administered 2022-07-23 (×4): 0.25 mg via INTRAVENOUS

## 2022-07-23 MED ORDER — PHENYLEPHRINE HCL-NACL 20-0.9 MG/250ML-% IV SOLN
INTRAVENOUS | Status: DC | PRN
  Administered 2022-07-23: 50 ug/min via INTRAVENOUS

## 2022-07-23 MED ORDER — INSULIN ASPART 100 UNIT/ML IJ SOLN
0.0000 [IU] | Freq: Three times a day (TID) | INTRAMUSCULAR | Status: DC
  Administered 2022-07-24 (×3): 3 [IU] via SUBCUTANEOUS

## 2022-07-23 MED ORDER — ACETAMINOPHEN 500 MG PO TABS: 1000.0000 mg | ORAL_TABLET | Freq: Once | ORAL | Status: AC

## 2022-07-23 MED ORDER — ADULT MULTIVITAMIN W/MINERALS CH
1.0000 | ORAL_TABLET | Freq: Every day | ORAL | Status: DC
  Administered 2022-07-23 – 2022-07-25 (×3): 1 via ORAL
  Filled 2022-07-23 (×3): qty 1

## 2022-07-23 MED ORDER — LEVOTHYROXINE SODIUM 50 MCG PO TABS
50.0000 ug | ORAL_TABLET | Freq: Every day | ORAL | Status: DC
  Administered 2022-07-24 – 2022-07-25 (×2): 50 ug via ORAL
  Filled 2022-07-23 (×2): qty 1

## 2022-07-23 MED ORDER — METOPROLOL TARTRATE 50 MG PO TABS
50.0000 mg | ORAL_TABLET | Freq: Two times a day (BID) | ORAL | Status: DC
  Administered 2022-07-23 – 2022-07-25 (×4): 50 mg via ORAL
  Filled 2022-07-23 (×4): qty 1

## 2022-07-23 MED ORDER — POLYVINYL ALCOHOL 1.4 % OP SOLN
1.0000 [drp] | Freq: Two times a day (BID) | OPHTHALMIC | Status: DC
  Administered 2022-07-25: 1 [drp] via OPHTHALMIC
  Filled 2022-07-23: qty 15

## 2022-07-23 MED ORDER — TAMSULOSIN HCL 0.4 MG PO CAPS
0.4000 mg | ORAL_CAPSULE | Freq: Every day | ORAL | Status: DC
  Administered 2022-07-23 – 2022-07-24 (×2): 0.4 mg via ORAL
  Filled 2022-07-23 (×2): qty 1

## 2022-07-23 MED ORDER — LINAGLIPTIN 5 MG PO TABS
5.0000 mg | ORAL_TABLET | Freq: Every day | ORAL | Status: DC
  Administered 2022-07-24 – 2022-07-25 (×2): 5 mg via ORAL
  Filled 2022-07-23 (×3): qty 1

## 2022-07-23 MED ORDER — SUCCINYLCHOLINE CHLORIDE 200 MG/10ML IV SOSY
PREFILLED_SYRINGE | INTRAVENOUS | Status: DC | PRN
  Administered 2022-07-23: 140 mg via INTRAVENOUS

## 2022-07-23 MED ORDER — BACITRACIN ZINC 500 UNIT/GM EX OINT
TOPICAL_OINTMENT | CUTANEOUS | Status: AC
  Filled 2022-07-23: qty 28.35

## 2022-07-23 MED ORDER — OXYCODONE HCL 5 MG/5ML PO SOLN: 5.0000 mg | Freq: Once | ORAL | Status: DC | PRN

## 2022-07-23 MED ORDER — INSULIN PEN NEEDLE 31G X 8 MM MISC: Freq: Two times a day (BID) | Status: DC

## 2022-07-23 MED ORDER — BACITRACIN ZINC 500 UNIT/GM EX OINT
TOPICAL_OINTMENT | Freq: Three times a day (TID) | CUTANEOUS | Status: DC
  Filled 2022-07-23: qty 28.4

## 2022-07-23 SURGICAL SUPPLY — 61 items
APPLIER CLIP 9.375 SM OPEN (CLIP)
BAG COUNTER SPONGE SURGICOUNT (BAG) ×2 IMPLANT
BLADE SURG 15 STRL LF DISP TIS (BLADE) IMPLANT
BLADE SURG 15 STRL SS (BLADE)
CANISTER SUCT 3000ML PPV (MISCELLANEOUS) ×2 IMPLANT
CLEANER TIP ELECTROSURG 2X2 (MISCELLANEOUS) ×2 IMPLANT
CLIP APPLIE 9.375 SM OPEN (CLIP) IMPLANT
CNTNR URN SCR LID CUP LEK RST (MISCELLANEOUS) ×2 IMPLANT
CONT SPEC 4OZ STRL OR WHT (MISCELLANEOUS) ×2
CORD BIPOLAR FORCEPS 12FT (ELECTRODE) ×2 IMPLANT
COVER SURGICAL LIGHT HANDLE (MISCELLANEOUS) ×2 IMPLANT
DERMABOND ADVANCED .7 DNX12 (GAUZE/BANDAGES/DRESSINGS) ×2 IMPLANT
DRAIN CHANNEL 15F RND FF W/TCR (WOUND CARE) IMPLANT
DRAIN JACKSON RD 7FR 3/32 (WOUND CARE) IMPLANT
DRAIN JP 10F RND RADIO (DRAIN) IMPLANT
DRAPE HALF SHEET 40X57 (DRAPES) IMPLANT
DRAPE INCISE 23X17 IOBAN STRL (DRAPES)
DRAPE INCISE 23X17 STRL (DRAPES) IMPLANT
DRAPE INCISE IOBAN 23X17 STRL (DRAPES) IMPLANT
ELECT COATED BLADE 2.86 ST (ELECTRODE) ×2 IMPLANT
ELECT REM PT RETURN 9FT ADLT (ELECTROSURGICAL) ×2
ELECTRODE REM PT RTRN 9FT ADLT (ELECTROSURGICAL) ×2 IMPLANT
EVACUATOR SILICONE 100CC (DRAIN) ×2 IMPLANT
FORCEPS BIPOLAR SPETZLER 8 1.0 (NEUROSURGERY SUPPLIES) ×2 IMPLANT
GAUZE 4X4 16PLY ~~LOC~~+RFID DBL (SPONGE) ×2 IMPLANT
GLOVE BIO SURGEON STRL SZ 6.5 (GLOVE) ×2 IMPLANT
GLOVE BIOGEL PI IND STRL 6.5 (GLOVE) IMPLANT
GLOVE ECLIPSE 7.5 STRL STRAW (GLOVE) ×2 IMPLANT
GOWN STRL REUS W/ TWL LRG LVL3 (GOWN DISPOSABLE) ×4 IMPLANT
GOWN STRL REUS W/TWL LRG LVL3 (GOWN DISPOSABLE) ×4
KIT BASIN OR (CUSTOM PROCEDURE TRAY) ×2 IMPLANT
KIT TURNOVER KIT B (KITS) ×2 IMPLANT
LOCATOR NERVE 3 VOLT (DISPOSABLE) IMPLANT
NDL PRECISIONGLIDE 27X1.5 (NEEDLE) ×2 IMPLANT
NEEDLE PRECISIONGLIDE 27X1.5 (NEEDLE) ×2 IMPLANT
NS IRRIG 1000ML POUR BTL (IV SOLUTION) ×2 IMPLANT
PAD ARMBOARD 7.5X6 YLW CONV (MISCELLANEOUS) ×4 IMPLANT
PENCIL FOOT CONTROL (ELECTRODE) ×2 IMPLANT
SHEARS HARMONIC 9CM CVD (BLADE) ×2 IMPLANT
SPECIMEN JAR MEDIUM (MISCELLANEOUS) IMPLANT
SPONGE INTESTINAL PEANUT (DISPOSABLE) IMPLANT
SPONGE T-LAP 18X18 ~~LOC~~+RFID (SPONGE) IMPLANT
STAPLER VISISTAT 35W (STAPLE) ×2 IMPLANT
SUT CHROMIC 3 0 PS 2 (SUTURE) ×2 IMPLANT
SUT CHROMIC 3 0 SH 27 (SUTURE) ×2 IMPLANT
SUT CHROMIC 4 0 PS 2 18 (SUTURE) IMPLANT
SUT CHROMIC 5 0 P 3 (SUTURE) IMPLANT
SUT ETHILON 3 0 PS 1 (SUTURE) ×2 IMPLANT
SUT ETHILON 5 0 PS 2 18 (SUTURE) IMPLANT
SUT SILK 2 0 (SUTURE) ×2
SUT SILK 2 0 REEL (SUTURE) IMPLANT
SUT SILK 2 0 SH (SUTURE) IMPLANT
SUT SILK 2-0 18XBRD TIE 12 (SUTURE) IMPLANT
SUT SILK 3 0 REEL (SUTURE) ×2 IMPLANT
SUT SILK 3 0 SH CR/8 (SUTURE) ×2 IMPLANT
SUT SILK 4 0 REEL (SUTURE) ×2 IMPLANT
SUT VIC AB 3-0 SH 18 (SUTURE) IMPLANT
TOWEL GREEN STERILE FF (TOWEL DISPOSABLE) ×2 IMPLANT
TRAY ENT MC OR (CUSTOM PROCEDURE TRAY) ×2 IMPLANT
TRAY FOLEY MTR SLVR 14FR STAT (SET/KITS/TRAYS/PACK) IMPLANT
TUBE FEEDING 10FR FLEXIFLO (MISCELLANEOUS) IMPLANT

## 2022-07-23 NOTE — Transfer of Care (Signed)
Immediate Anesthesia Transfer of Care Note  Patient: Jeremy Ferrell  Procedure(s) Performed: TOTAL THYROIDECTOMY (Bilateral: Neck) RIGHT PARTIAL NECK DISSECTION (Right: Neck)  Patient Location: PACU  Anesthesia Type:General  Level of Consciousness: awake and alert   Airway & Oxygen Therapy: Patient Spontanous Breathing and Patient connected to face mask oxygen  Post-op Assessment: Report given to RN and Post -op Vital signs reviewed and stable  Post vital signs: Reviewed and stable  Last Vitals:  Vitals Value Taken Time  BP 98/66 07/23/22 1350  Temp    Pulse 76 07/23/22 1354  Resp 21 07/23/22 1354  SpO2 96 % 07/23/22 1354  Vitals shown include unvalidated device data.  Last Pain:  Vitals:   07/23/22 0848  TempSrc:   PainSc: 0-No pain         Complications: No notable events documented.

## 2022-07-23 NOTE — Anesthesia Preprocedure Evaluation (Addendum)
Anesthesia Evaluation  Patient identified by MRN, date of birth, ID band Patient awake    Reviewed: Allergy & Precautions, NPO status , Patient's Chart, lab work & pertinent test results, reviewed documented beta blocker date and time   History of Anesthesia Complications Negative for: history of anesthetic complications  Airway Mallampati: III  TM Distance: >3 FB Neck ROM: Full    Dental  (+) Caps, Dental Advisory Given   Pulmonary former smoker   breath sounds clear to auscultation       Cardiovascular hypertension, Pt. on medications and Pt. on home beta blockers (-) angina  Rhythm:Regular Rate:Normal     Neuro/Psych negative neurological ROS     GI/Hepatic negative GI ROS, Neg liver ROS,,,  Endo/Other  diabetes (glu 174), Insulin Dependent, Oral Hypoglycemic AgentsHypothyroidism  Thyroid cancer  Renal/GU negative Renal ROS     Musculoskeletal   Abdominal   Peds  Hematology negative hematology ROS (+)   Anesthesia Other Findings   Reproductive/Obstetrics                              Anesthesia Physical Anesthesia Plan  ASA: 3  Anesthesia Plan: General   Post-op Pain Management: Tylenol PO (pre-op)*   Induction: Intravenous  PONV Risk Score and Plan: 2 and Ondansetron and Dexamethasone  Airway Management Planned: Oral ETT and Video Laryngoscope Planned  Additional Equipment: None  Intra-op Plan:   Post-operative Plan: Extubation in OR  Informed Consent: I have reviewed the patients History and Physical, chart, labs and discussed the procedure including the risks, benefits and alternatives for the proposed anesthesia with the patient or authorized representative who has indicated his/her understanding and acceptance.     Dental advisory given  Plan Discussed with: CRNA and Surgeon  Anesthesia Plan Comments:          Anesthesia Quick Evaluation

## 2022-07-23 NOTE — Anesthesia Procedure Notes (Addendum)
Procedure Name: Intubation Date/Time: 07/23/2022 10:45 AM  Performed by: Erick Colace, CRNAPre-anesthesia Checklist: Patient identified, Emergency Drugs available, Suction available and Patient being monitored Patient Re-evaluated:Patient Re-evaluated prior to induction Oxygen Delivery Method: Circle system utilized Preoxygenation: Pre-oxygenation with 100% oxygen Induction Type: IV induction Ventilation: Mask ventilation without difficulty and Oral airway inserted - appropriate to patient size Laryngoscope Size: Glidescope and 4 Grade View: Grade I Tube type: Oral Tube size: 7.5 mm Number of attempts: 1 Airway Equipment and Method: Stylet and Oral airway Placement Confirmation: ETT inserted through vocal cords under direct vision, positive ETCO2 and breath sounds checked- equal and bilateral Secured at: 22 cm Tube secured with: Tape Dental Injury: Teeth and Oropharynx as per pre-operative assessment  Comments: Elective glidescope due to reduced neck mobility and thyroid mass.

## 2022-07-23 NOTE — Op Note (Signed)
OPERATIVE REPORT  DATE OF SURGERY: 07/23/2022  PATIENT:  Jeremy Ferrell,  87 y.o. male  PRE-OPERATIVE DIAGNOSIS:  Follicular thyroid cancer; Primary cancer of thyroid with metastasis to other site  POST-OPERATIVE DIAGNOSIS:  Follicular thyroid cancer; Primary cancer of thyroid with metastasis to other site  PROCEDURE:  Procedure(s): TOTAL THYROIDECTOMY RIGHT PARTIAL NECK DISSECTION  SURGEON:  Beckie Salts, MD  ASSISTANTS: RNFA  ANESTHESIA:   General   EBL: 200 ml  DRAINS: 15 French round JP  LOCAL MEDICATIONS USED:  None  SPECIMEN: Total thyroidectomy with right partial neck dissection  COUNTS:  Correct  PROCEDURE DETAILS: The patient was taken to the operating room and placed on the operating table in the supine position. Following induction of general endotracheal anesthesia, the neck was prepped and draped in a standard fashion.  There was a previous transverse scar in the anterior neck.  This was marked with a marking pen with extension along the right side of the neck towards the mastoid process.  Electrocautery was used to incise the skin and subcutaneous tissue and through the platysma layer.  Subplatysmal flaps were elevated superiorly towards the submandibular gland and inferiorly towards the clavicle.  Large hard mass was identified in right level 4.  It appeared to be adhesed to the sternocleidomastoid muscle.  The muscle was divided inferiorly at the clavicle.  It was then divided up superiorly at approximately the level of the submandibular gland.  The internal jugular vein was identified on both sides.  This was ligated between clamps using 3-0 suture ligature and 2 oh free tie.  The vagus nerve was preserved at the ankle.  The level 5 fibrofatty tissue was dissected anteriorly.  The tumor mass appeared to be adhesed to the deep cervical musculature.  Cervical branches of the cervical plexus were sacrificed.  I believe the phrenic nerve was sacrificed.  The carotid  was dissected free along with the vagus and were preserved.  The tumor mass extended down to the clavicle.  The omohyoid muscle was sacrificed.  The specimen was brought forward.  The tumor was dissected off of the remaining strap muscles and removed.  The wound was irrigated.  Hemostasis was completed using additional 4-0 silk ties and electrocautery as needed.  There is no identifiable chyle leak.  Total thyroidectomy.  The left thyroid lobe was small but there was a firm mass within.  The superior pole was dissected down and the superior vasculature was ligated between clamps using the harmonic dissector.  A putative parathyroid was identified and preserved with its blood supply.  The recurrent nerve was also preserved middle thyroid vein and lower vasculature were then dissected in a similar fashion as the gland was brought towards the trachea.  There is ligament was divided as the gland was dissected off the trachea.  The similar procedure was performed on the right side.  The right side had a much larger also very firm mass.  Again the superior parathyroid was identified and the recurrent nerve was identified and preserved.  The entire specimen was delivered and sent for pathologic evaluation along with the neck dissection.  The thyroid was oriented to sides.  This portion of the wound was also irrigated with saline and hemostasis was completed with additional was incised and cautery as needed.    The drain was exited through a separate stab incision secured in place with nylon suture.  The platysma layer was reapproximated interrupted 3-0 chromic.  Skin staples were used on  the skin.  Patient was awakened extubated and transferred to recovery in stable condition.  The drain was charged and bacitracin was applied to the staple line.    PATIENT DISPOSITION:  To PACU, stable

## 2022-07-23 NOTE — Anesthesia Postprocedure Evaluation (Signed)
Anesthesia Post Note  Patient: Jeremy Ferrell  Procedure(s) Performed: TOTAL THYROIDECTOMY (Bilateral: Neck) RIGHT PARTIAL NECK DISSECTION (Right: Neck)     Patient location during evaluation: PACU Anesthesia Type: General Level of consciousness: awake and alert, patient cooperative and oriented Pain management: pain level controlled Vital Signs Assessment: post-procedure vital signs reviewed and stable Respiratory status: spontaneous breathing, nonlabored ventilation and respiratory function stable Cardiovascular status: blood pressure returned to baseline and stable Postop Assessment: no apparent nausea or vomiting Anesthetic complications: no   No notable events documented.  Last Vitals:  Vitals:   07/23/22 1530 07/23/22 1545  BP: (!) 146/62 (!) 149/69  Pulse: 79 82  Resp: 18 16  Temp:    SpO2: 94% 95%    Last Pain:  Vitals:   07/23/22 1545  TempSrc:   PainSc: Asleep                 Kendallyn Lippold,E. Nashika Coker

## 2022-07-23 NOTE — Progress Notes (Signed)
ENT Post Operative Note  Subjective: Patient seen and examined at bedside. Reports significant throat pain, improved with medication. Tolerating PO intake. No perioral numbness or tingling  Vitals:   07/23/22 1545 07/23/22 1617  BP: (!) 149/69 (!) 141/69  Pulse: 82 83  Resp: 16 18  Temp:  97.7 F (36.5 C)  SpO2: 95% 99%     OBJECTIVE  Gen: alert, cooperative, appropriate Head/ENT: EOMI, mucus membranes moist and pink, conjunctiva clear Left half apron neck incision C/D/I with staples intact. Neck soft, with no evidence of seroma or hematoma. JP drain exiting neck with sanguinous drainage. Respiratory: Voice without dysphonia. Non-labored breathing, no accessory muscle use Neuro: CN II-XII grossly intact  ASSESS/ PLAN  Jeremy Ferrell is a 87 y.o. male who is POD 0 from total thyroidectomy and right partial neck dissection.  -Continue observation -Continue JP drain to bulb suction. Empty, record output, and recharge every 6 hours. -Encourage IS use, ambulation to tolerance with assist -Pain control -Titrate O2 to RA  -Trend calcium  Thank you for allowing me to participate in the care of this patient. Please do not hesitate to contact me with any questions or concerns.   Jason Coop, [facility] Cell: [phone]

## 2022-07-23 NOTE — Plan of Care (Signed)
  Problem: Education: Goal: Ability to describe self-care measures that may prevent or decrease complications (Diabetes Survival Skills Education) will improve Outcome: Progressing Goal: Individualized Educational Video(s) Outcome: Progressing   Problem: Coping: Goal: Ability to adjust to condition or change in health will improve Outcome: Progressing   Problem: Fluid Volume: Goal: Ability to maintain a balanced intake and output will improve Outcome: Progressing   Problem: Health Behavior/Discharge Planning: Goal: Ability to identify and utilize available resources and services will improve Outcome: Progressing Goal: Ability to manage health-related needs will improve Outcome: Progressing   Problem: Metabolic: Goal: Ability to maintain appropriate glucose levels will improve Outcome: Progressing   Problem: Nutritional: Goal: Maintenance of adequate nutrition will improve Outcome: Progressing Goal: Progress toward achieving an optimal weight will improve Outcome: Progressing   Problem: Skin Integrity: Goal: Risk for impaired skin integrity will decrease Outcome: Progressing   Problem: Tissue Perfusion: Goal: Adequacy of tissue perfusion will improve Outcome: Progressing   Problem: Education: Goal: Knowledge of the prescribed therapeutic regimen will improve Outcome: Progressing   Problem: Activity: Goal: Ability to tolerate increased activity will improve Outcome: Progressing   Problem: Health Behavior/Discharge Planning: Goal: Identification of resources available to assist in meeting health care needs will improve Outcome: Progressing   Problem: Nutrition: Goal: Maintenance of adequate nutrition will improve Outcome: Progressing   Problem: Clinical Measurements: Goal: Complications related to the disease process, condition or treatment will be avoided or minimized Outcome: Progressing   Problem: Respiratory: Goal: Will regain and/or maintain adequate  ventilation Outcome: Progressing   Problem: Skin Integrity: Goal: Demonstration of wound healing without infection will improve Outcome: Progressing   Problem: Education: Goal: Knowledge of General Education information will improve Description: Including pain rating scale, medication(s)/side effects and non-pharmacologic comfort measures Outcome: Progressing   Problem: Health Behavior/Discharge Planning: Goal: Ability to manage health-related needs will improve Outcome: Progressing   Problem: Clinical Measurements: Goal: Ability to maintain clinical measurements within normal limits will improve Outcome: Progressing Goal: Will remain free from infection Outcome: Progressing Goal: Diagnostic test results will improve Outcome: Progressing Goal: Respiratory complications will improve Outcome: Progressing Goal: Cardiovascular complication will be avoided Outcome: Progressing   Problem: Activity: Goal: Risk for activity intolerance will decrease Outcome: Progressing   Problem: Nutrition: Goal: Adequate nutrition will be maintained Outcome: Progressing   Problem: Coping: Goal: Level of anxiety will decrease Outcome: Progressing   Problem: Elimination: Goal: Will not experience complications related to bowel motility Outcome: Progressing Goal: Will not experience complications related to urinary retention Outcome: Progressing   Problem: Pain Managment: Goal: General experience of comfort will improve Outcome: Progressing   Problem: Safety: Goal: Ability to remain free from injury will improve Outcome: Progressing   Problem: Skin Integrity: Goal: Risk for impaired skin integrity will decrease Outcome: Progressing

## 2022-07-24 ENCOUNTER — Encounter (HOSPITAL_COMMUNITY): Payer: Self-pay | Admitting: Otolaryngology

## 2022-07-24 LAB — GLUCOSE, CAPILLARY
Glucose-Capillary: 177 mg/dL — ABNORMAL HIGH (ref 70–99)
Glucose-Capillary: 183 mg/dL — ABNORMAL HIGH (ref 70–99)
Glucose-Capillary: 151 mg/dL — ABNORMAL HIGH (ref 70–99)
Glucose-Capillary: 122 mg/dL — ABNORMAL HIGH (ref 70–99)

## 2022-07-24 LAB — CALCIUM: Calcium: 9.4 mg/dL (ref 8.9–10.3)

## 2022-07-24 NOTE — Progress Notes (Signed)
Pt foley removed w./o difficulty. Pt has voided 3 times in bathroom. Pt ambulated length of hall with no assistance or SOB.  Jeremy Ferrell 07/24/22 2:11 PM

## 2022-07-24 NOTE — Progress Notes (Signed)
Subjective: Feeling much better this morning.  He had a rough day yesterday postanesthesia.  Minimal pain.  Objective: Vital signs in last 24 hours: Temp:  [97.7 F (36.5 C)-98.4 F (36.9 C)] 98.1 F (36.7 C) (12/05 0809) Pulse Rate:  [63-87] 64 (12/05 0809) Resp:  [15-22] 18 (12/05 0809) BP: (81-151)/(55-79) 134/55 (12/05 0809) SpO2:  [91 %-99 %] 94 % (12/05 0809) Weight change:  Last BM Date : 07/22/22  Intake/Output from previous day: 12/04 0701 - 12/05 0700 In: 1260 [P.O.:60; I.V.:1200] Out: 2826 [Urine:2400; Drains:226; Blood:200] Intake/Output this shift: No intake/output data recorded.  PHYSICAL EXAM: Awake and alert.  Incision is intact and flat.  No signs of infection or hematoma.  There is serosanguineous material in the drain.  All cranial nerves appear to be functioning.  Lab Results: No results for input(s): "WBC", "HGB", "HCT", "PLT" in the last 72 hours. BMET Recent Labs    07/23/22 1641 07/24/22 0120  CALCIUM 8.7* 9.4    Studies/Results: No results found.  Medications: I have reviewed the patient's current medications.  Assessment/Plan: Stable postop day 1.  We will stop checking calcium levels.  Continue to monitor drainage output.  We will DC the Foley catheter.  We will get him up walking.  Once he is walking, eating and drinking, and able to void we can consider discharge home with the drain.  Plan is to keep the drain in until Thursday.  LOS: 1 day      Izora Gala 07/24/2022, 8:41 AM

## 2022-07-25 LAB — SURGICAL PATHOLOGY

## 2022-07-25 LAB — GLUCOSE, CAPILLARY: Glucose-Capillary: 80 mg/dL (ref 70–99)

## 2022-07-25 MED ORDER — HYDROCODONE-ACETAMINOPHEN 7.5-325 MG PO TABS: 1.0000 | ORAL_TABLET | Freq: Four times a day (QID) | ORAL | 0 refills | Status: DC | PRN

## 2022-07-25 MED ORDER — ONDANSETRON 4 MG PO TBDP: 4.0000 mg | ORAL_TABLET | Freq: Three times a day (TID) | ORAL | 0 refills | Status: DC | PRN

## 2022-07-25 NOTE — Plan of Care (Signed)
  Problem: Education: Goal: Ability to describe self-care measures that may prevent or decrease complications (Diabetes Survival Skills Education) will improve Outcome: Progressing Goal: Individualized Educational Video(s) Outcome: Progressing   Problem: Coping: Goal: Ability to adjust to condition or change in health will improve Outcome: Progressing   Problem: Fluid Volume: Goal: Ability to maintain a balanced intake and output will improve Outcome: Progressing   Problem: Health Behavior/Discharge Planning: Goal: Ability to identify and utilize available resources and services will improve Outcome: Progressing Goal: Ability to manage health-related needs will improve Outcome: Progressing   Problem: Metabolic: Goal: Ability to maintain appropriate glucose levels will improve Outcome: Progressing   Problem: Nutritional: Goal: Maintenance of adequate nutrition will improve Outcome: Progressing Goal: Progress toward achieving an optimal weight will improve Outcome: Progressing   Problem: Skin Integrity: Goal: Risk for impaired skin integrity will decrease Outcome: Progressing   Problem: Tissue Perfusion: Goal: Adequacy of tissue perfusion will improve Outcome: Progressing   Problem: Education: Goal: Knowledge of the prescribed therapeutic regimen will improve Outcome: Progressing   Problem: Activity: Goal: Ability to tolerate increased activity will improve Outcome: Progressing   Problem: Health Behavior/Discharge Planning: Goal: Identification of resources available to assist in meeting health care needs will improve Outcome: Progressing   Problem: Nutrition: Goal: Maintenance of adequate nutrition will improve Outcome: Progressing   Problem: Clinical Measurements: Goal: Complications related to the disease process, condition or treatment will be avoided or minimized Outcome: Progressing   Problem: Respiratory: Goal: Will regain and/or maintain adequate  ventilation Outcome: Progressing   Problem: Skin Integrity: Goal: Demonstration of wound healing without infection will improve Outcome: Progressing   Problem: Education: Goal: Knowledge of General Education information will improve Description: Including pain rating scale, medication(s)/side effects and non-pharmacologic comfort measures Outcome: Progressing   Problem: Health Behavior/Discharge Planning: Goal: Ability to manage health-related needs will improve Outcome: Progressing   Problem: Clinical Measurements: Goal: Ability to maintain clinical measurements within normal limits will improve Outcome: Progressing Goal: Will remain free from infection Outcome: Progressing Goal: Diagnostic test results will improve Outcome: Progressing Goal: Respiratory complications will improve Outcome: Progressing Goal: Cardiovascular complication will be avoided Outcome: Progressing   Problem: Activity: Goal: Risk for activity intolerance will decrease Outcome: Progressing   Problem: Nutrition: Goal: Adequate nutrition will be maintained Outcome: Progressing   Problem: Coping: Goal: Level of anxiety will decrease Outcome: Progressing   Problem: Elimination: Goal: Will not experience complications related to bowel motility Outcome: Progressing Goal: Will not experience complications related to urinary retention Outcome: Progressing   Problem: Pain Managment: Goal: General experience of comfort will improve Outcome: Progressing   Problem: Safety: Goal: Ability to remain free from injury will improve Outcome: Progressing   Problem: Skin Integrity: Goal: Risk for impaired skin integrity will decrease Outcome: Progressing

## 2022-07-25 NOTE — Progress Notes (Signed)
Patient educated on discharge instructions and discharge medications including follow up appts. Patient states he understands education provided to him. Ivx1 taken out. Patient has son at bedside to take paitent home. All belongings accounted for.

## 2022-07-25 NOTE — Progress Notes (Signed)
Patient has discharge orders in place. Morning BG is 80. Dr. Constance Holster made aware. Per Dr. Constance Holster to hold semglee dose this morning and ok to give rest of oral diabetic meds

## 2022-07-25 NOTE — Discharge Summary (Signed)
Physician Discharge Summary  Patient ID: DEJOHN IBARRA MRN: [id number] DOB/AGE: [birth year] 87 y.o.  Admit date: 07/23/2022 Discharge date: 07/25/2022  Admission Diagnoses:thyroid cancer  Discharge Diagnoses:  Principal Problem:   Thyroid cancer [facility])   Discharged Condition: good  Hospital Course: no complications  Consults: none  Significant Diagnostic Studies: none  Treatments: surgery: thyroidectomy, neck dissection  Discharge Exam: Blood pressure (!) 146/64, pulse (!) 109, temperature 98.2 F (36.8 C), temperature source Oral, resp. rate 18, height 5\' 11"  (1.803 m), weight 86.2 kg, SpO2 95 %. PHYSICAL EXAM: Awake and alert, neck no swelling, voice normal, jp in place.   Disposition: Discharge disposition: 01-Home or Self Care       Discharge Instructions     Diet - low sodium heart healthy   Complete by: As directed    Discharge wound care:   Complete by: As directed    Empty drain and recharge 3 times daily   Increase activity slowly   Complete by: As directed       Allergies as of 07/25/2022       Reactions   Penicillins Hives        Medication List     TAKE these medications    B-D ULTRAFINE III SHORT PEN 31G X 8 MM Misc Generic drug: Insulin Pen Needle Inject into the skin 2 (two) times daily.   ESTER-C PO Take 2 tablets by mouth daily. Immune 500 mg each   ezetimibe 10 MG tablet Commonly known as: ZETIA Take 10 mg by mouth at bedtime.   Farxiga 10 MG Tabs tablet Generic drug: dapagliflozin propanediol Take 10 mg by mouth daily.   HYDROcodone-acetaminophen 7.5-325 MG tablet Commonly known as: Norco Take 1 tablet by mouth every 6 (six) hours as needed for moderate pain.   Janumet 50-1000 MG tablet Generic drug: sitaGLIPtin-metformin Take 1 tablet by mouth 2 (two) times daily.   Krill Oil 500 MG Caps Take 500 mg by mouth daily. Mega Red  omega 3   Lantus SoloStar 100 UNIT/ML Solostar Pen Generic drug: insulin  glargine Inject 59 Units into the skin 2 (two) times daily. Before breakfast and at bedtime   levothyroxine 50 MCG tablet Commonly known as: SYNTHROID Take 50 mcg by mouth daily before breakfast.   lisinopril 10 MG tablet Commonly known as: ZESTRIL Take 10 mg by mouth daily.   metoprolol tartrate 50 MG tablet Commonly known as: LOPRESSOR Take 50 mg by mouth 2 (two) times daily.   multivitamin with minerals tablet Take 1 tablet by mouth daily. Centrum 50+   nateglinide 120 MG tablet Commonly known as: STARLIX Take 120 mg by mouth 3 (three) times daily.   ondansetron 4 MG disintegrating tablet Commonly known as: ZOFRAN-ODT Take 1 tablet (4 mg total) by mouth every 8 (eight) hours as needed for nausea or vomiting.   simvastatin 20 MG tablet Commonly known as: ZOCOR Take 20 mg by mouth at bedtime.   Systane Ultra 0.4-0.3 % Soln Generic drug: Polyethyl Glycol-Propyl Glycol Place 1 drop into both eyes 2 (two) times daily.   tamsulosin 0.4 MG Caps capsule Commonly known as: FLOMAX Take 0.4 mg by mouth at bedtime.   tobramycin 0.3 % ophthalmic solution Commonly known as: TOBREX SMARTSIG:In Eye(s)               Discharge Care Instructions  (From admission, onward)           Start     Ordered   07/25/22 0000  Discharge  wound care:       Comments: Empty drain and recharge 3 times daily   07/25/22 0854             Signed: Izora Gala 07/25/2022, 8:55 AM

## 2022-07-25 NOTE — Discharge Instructions (Signed)
Apply bacitracin to staples twice daily  Keep neck clean and dry.

## 2022-08-15 ENCOUNTER — Other Ambulatory Visit: Payer: Self-pay | Admitting: Internal Medicine

## 2022-08-15 DIAGNOSIS — C770 Secondary and unspecified malignant neoplasm of lymph nodes of head, face and neck: Secondary | ICD-10-CM

## 2022-08-15 DIAGNOSIS — C7800 Secondary malignant neoplasm of unspecified lung: Secondary | ICD-10-CM | POA: Diagnosis not present

## 2022-08-15 DIAGNOSIS — C73 Malignant neoplasm of thyroid gland: Secondary | ICD-10-CM

## 2022-08-15 DIAGNOSIS — R768 Other specified abnormal immunological findings in serum: Secondary | ICD-10-CM

## 2022-08-15 DIAGNOSIS — R946 Abnormal results of thyroid function studies: Secondary | ICD-10-CM | POA: Diagnosis not present

## 2022-08-15 DIAGNOSIS — E538 Deficiency of other specified B group vitamins: Secondary | ICD-10-CM | POA: Diagnosis not present

## 2022-08-15 DIAGNOSIS — D7589 Other specified diseases of blood and blood-forming organs: Secondary | ICD-10-CM | POA: Diagnosis not present

## 2022-08-15 DIAGNOSIS — Z23 Encounter for immunization: Secondary | ICD-10-CM | POA: Diagnosis not present

## 2022-08-28 ENCOUNTER — Ambulatory Visit
Admission: RE | Admit: 2022-08-28 | Discharge: 2022-08-28 | Disposition: A | Discharge status: Home / Self Care | Payer: Medicare Other | Source: Ambulatory Visit | Attending: Internal Medicine | Admitting: Internal Medicine

## 2022-08-28 DIAGNOSIS — C73 Malignant neoplasm of thyroid gland: Secondary | ICD-10-CM

## 2022-08-28 DIAGNOSIS — E890 Postprocedural hypothyroidism: Secondary | ICD-10-CM | POA: Diagnosis not present

## 2022-08-28 DIAGNOSIS — C7800 Secondary malignant neoplasm of unspecified lung: Secondary | ICD-10-CM

## 2022-08-28 DIAGNOSIS — C770 Secondary and unspecified malignant neoplasm of lymph nodes of head, face and neck: Secondary | ICD-10-CM

## 2022-08-28 DIAGNOSIS — R768 Other specified abnormal immunological findings in serum: Secondary | ICD-10-CM

## 2022-08-29 DIAGNOSIS — H43812 Vitreous degeneration, left eye: Secondary | ICD-10-CM | POA: Diagnosis not present

## 2022-08-29 DIAGNOSIS — H353231 Exudative age-related macular degeneration, bilateral, with active choroidal neovascularization: Secondary | ICD-10-CM | POA: Diagnosis not present

## 2022-08-29 DIAGNOSIS — E113293 Type 2 diabetes mellitus with mild nonproliferative diabetic retinopathy without macular edema, bilateral: Secondary | ICD-10-CM | POA: Diagnosis not present

## 2022-09-04 ENCOUNTER — Other Ambulatory Visit (HOSPITAL_COMMUNITY): Payer: Self-pay | Admitting: Internal Medicine

## 2022-09-04 DIAGNOSIS — C73 Malignant neoplasm of thyroid gland: Secondary | ICD-10-CM

## 2022-09-06 ENCOUNTER — Ambulatory Visit: Payer: Medicare Other | Admitting: Hematology

## 2022-10-02 ENCOUNTER — Other Ambulatory Visit (HOSPITAL_COMMUNITY): Payer: Self-pay | Admitting: Internal Medicine

## 2022-10-02 DIAGNOSIS — C73 Malignant neoplasm of thyroid gland: Secondary | ICD-10-CM

## 2022-10-02 NOTE — Written Directive (Addendum)
MOLECULAR IMAGING AND THERAPEUTICS WRITTEN DIRECTIVE   PATIENT NAME: Jeremy Ferrell  PT DOB:   20-Sep-1934                                              MRN: [id number]  ---------------------------------------------------------------------------------------------------------------------   I-131 THYROID CANCER THERAPY   RADIOPHARMACEUTICAL:  Iodine-131 Capsule    PRESCRIBED DOSE FOR ADMINISTRATION: 115 mCi   ROUTE OFADMINISTRATION:  PO   DIAGNOSIS:Follicular thyroid cancer   REFERRING PHYSICIAN:Dr. Buddy Duty   THYROGEN STIMULATION OR HORMONE WITHDRAW: THYROGEN STIMULATION    REMNANT ABLATION OR ADJUVANT THERAPY:REMNANT ABLATION    DATE OF THYROIDECTOMY:07/23/2022   SURGEON:Dr Constance Holster   TSH:   Lab Results  Component Value Date   TSH 3.287 05/22/2022   TSH 3.218 09/14/2010     PRIOR I-131 THERAPY (Date and Dose):   Pathology:  Cell type: [x]   Papillary  []   Follicular  []   [place]   Largest tumor focus:   0.9   cm  Extrathyroidal Extension?     Yes []   No [x]     Lymphovascular Invasion?  Yes  []   No  [x]     Margins positive ? Yes []   No [x]     Lymph nodes positive? Yes [x]   No  []       # positive nodes:  5 # negative nodes:  9   TNM staging: pT: 1a        PN:  1b       Mx:    ADDITIONAL PHYSICIAN COMMENTS/NOTES Intermediate risk PTC  - 88 year old male   AUTHORIZED USER SIGNATURE & TIME STAMP: Rennis Golden, MD   10/05/22    8:25 AM

## 2022-10-05 DIAGNOSIS — C73 Malignant neoplasm of thyroid gland: Secondary | ICD-10-CM | POA: Diagnosis not present

## 2022-10-14 ENCOUNTER — Encounter (HOSPITAL_COMMUNITY): Payer: Self-pay | Admitting: *Deleted

## 2022-10-14 ENCOUNTER — Emergency Department (HOSPITAL_COMMUNITY)
Admission: EM | Admit: 2022-10-14 | Discharge: 2022-10-14 | Disposition: A | Discharge status: Home / Self Care | Payer: Medicare Other | Attending: Emergency Medicine | Admitting: Emergency Medicine

## 2022-10-14 ENCOUNTER — Other Ambulatory Visit: Payer: Self-pay

## 2022-10-14 DIAGNOSIS — Z794 Long term (current) use of insulin: Secondary | ICD-10-CM | POA: Diagnosis not present

## 2022-10-14 DIAGNOSIS — R4182 Altered mental status, unspecified: Secondary | ICD-10-CM | POA: Diagnosis not present

## 2022-10-14 DIAGNOSIS — E162 Hypoglycemia, unspecified: Secondary | ICD-10-CM | POA: Diagnosis not present

## 2022-10-14 DIAGNOSIS — E1165 Type 2 diabetes mellitus with hyperglycemia: Secondary | ICD-10-CM | POA: Diagnosis not present

## 2022-10-14 DIAGNOSIS — R404 Transient alteration of awareness: Secondary | ICD-10-CM | POA: Diagnosis not present

## 2022-10-14 DIAGNOSIS — E11649 Type 2 diabetes mellitus with hypoglycemia without coma: Secondary | ICD-10-CM | POA: Diagnosis not present

## 2022-10-14 DIAGNOSIS — Z7984 Long term (current) use of oral hypoglycemic drugs: Secondary | ICD-10-CM | POA: Diagnosis not present

## 2022-10-14 LAB — CBC WITH DIFFERENTIAL/PLATELET
Abs Immature Granulocytes: 0.04 10*3/uL (ref 0.00–0.07)
Basophils Absolute: 0 10*3/uL (ref 0.0–0.1)
Basophils Relative: 0 %
Eosinophils Absolute: 0 10*3/uL (ref 0.0–0.5)
Eosinophils Relative: 1 %
HCT: 43.3 % (ref 39.0–52.0)
Hemoglobin: 14.5 g/dL (ref 13.0–17.0)
Immature Granulocytes: 1 %
Lymphocytes Relative: 11 %
Lymphs Abs: 0.8 10*3/uL (ref 0.7–4.0)
MCH: 33.5 pg (ref 26.0–34.0)
MCHC: 33.5 g/dL (ref 30.0–36.0)
MCV: 100 fL (ref 80.0–100.0)
Monocytes Absolute: 0.7 10*3/uL (ref 0.1–1.0)
Monocytes Relative: 10 %
Neutro Abs: 5.6 10*3/uL (ref 1.7–7.7)
Neutrophils Relative %: 77 %
Platelets: 301 10*3/uL (ref 150–400)
RBC: 4.33 MIL/uL (ref 4.22–5.81)
RDW: 13.9 % (ref 11.5–15.5)
WBC: 7.2 10*3/uL (ref 4.0–10.5)
nRBC: 0 % (ref 0.0–0.2)

## 2022-10-14 LAB — COMPREHENSIVE METABOLIC PANEL
ALT: 19 U/L (ref 0–44)
AST: 30 U/L (ref 15–41)
Albumin: 3.5 g/dL (ref 3.5–5.0)
Alkaline Phosphatase: 67 U/L (ref 38–126)
Anion gap: 10 (ref 5–15)
BUN: 29 mg/dL — ABNORMAL HIGH (ref 8–23)
CO2: 23 mmol/L (ref 22–32)
Calcium: 8.7 mg/dL — ABNORMAL LOW (ref 8.9–10.3)
Chloride: 108 mmol/L (ref 98–111)
Creatinine, Ser: 1.13 mg/dL (ref 0.61–1.24)
GFR, Estimated: >60 mL/min (ref >60)
Glucose, Bld: 85 mg/dL (ref 70–99)
Potassium: 4.6 mmol/L (ref 3.5–5.1)
Sodium: 141 mmol/L (ref 135–145)
Total Bilirubin: 1 mg/dL (ref 0.3–1.2)
Total Protein: 6.2 g/dL — ABNORMAL LOW (ref 6.5–8.1)

## 2022-10-14 LAB — CBG MONITORING, ED
Glucose-Capillary: 62 mg/dL — ABNORMAL LOW (ref 70–99)
Glucose-Capillary: 71 mg/dL (ref 70–99)
Glucose-Capillary: 171 mg/dL — ABNORMAL HIGH (ref 70–99)
Glucose-Capillary: 155 mg/dL — ABNORMAL HIGH (ref 70–99)

## 2022-10-14 MED ORDER — DEXTROSE 50 % IV SOLN
50.0000 mL | Freq: Once | INTRAVENOUS | Status: AC
  Administered 2022-10-14: 50 mL via INTRAVENOUS
  Filled 2022-10-14: qty 50

## 2022-10-14 NOTE — ED Notes (Signed)
Patient wanting to go home. MD aware and family wants to speak with MD which MD was notified

## 2022-10-14 NOTE — TOC Transition Note (Signed)
Transition of Care [facility]) - CM/SW Discharge Note   Patient Details  Name: Jeremy Ferrell MRN: [id number] Date of Birth: Sep 16, 1934  [facility]) CM/SW Contact:  Boneta Lucks, RN Phone Number: 10/14/2022, 11:54 AM   Clinical Narrative:   Patient discharging from ED. TOC consulted to home health. CM called son, he is agreeable. MD ordered. Caryl Pina with [facility] accepted the referral.    Final next level of care: [place] Barriers to Discharge: Barriers Resolved   Patient Goals and CMS Choice CMS Medicare.gov Compare Post Acute Care list provided to:: Patient Represenative (must comment) Choice offered to / list presented to : Adult Children  Discharge Placement     Discharge Plan and Services Additional resources added to the After Visit Summary for           Social Determinants of Health (SDOH) Interventions SDOH Screenings   Food Insecurity: No Food Insecurity (07/23/2022)  Housing: Low Risk  (07/23/2022)  Transportation Needs: No Transportation Needs (07/23/2022)  Utilities: Not At Risk (07/23/2022)  Tobacco Use: Medium Risk (10/14/2022)

## 2022-10-14 NOTE — ED Triage Notes (Signed)
Pt brought in by [facility] from home with c/o hypoglycemia. Pt's initial blood sugar was 59 and pt was agitated and combative when EMS arrived. Pt was given 214m of D10 by EMS. Last blood sugar by EMS was 150. Pt is A&O at time of arrival to ED. Pt has had 2-3 episodes of hypoglycemia that have required treatment over the last week.

## 2022-10-14 NOTE — Discharge Instructions (Signed)
Decrease your insulin so you are taking 50 units.  Record your blood sugar twice a day and follow-up with your family doctor this week

## 2022-10-14 NOTE — ED Provider Notes (Signed)
[place] EMERGENCY DEPARTMENT AT [facility] Provider Note   CSN: [id number] Arrival date & time: 10/14/22  0720     History  Chief Complaint  Patient presents with   Hypoglycemia    Jeremy Ferrell is a 88 y.o. male.  Patient states he takes 50 units of insulin twice a day and has not been eating as much.  His glucose has been running low and he had a hypoglycemic episode today.  He was found by his son confused  The history is provided by the patient and a relative. No language interpreter was used.  Weakness Severity:  Mild Onset quality:  Sudden Timing:  Intermittent Progression:  Waxing and waning Chronicity:  Recurrent Context: not alcohol use   Relieved by:  Nothing Worsened by:  Nothing Ineffective treatments:  None tried Associated symptoms: no abdominal pain, no chest pain, no cough, no diarrhea, no frequency, no headaches and no seizures        Home Medications Prior to Admission medications   Medication Sig Start Date End Date Taking? Authorizing Provider  B-D ULTRAFINE III SHORT PEN 31G X 8 MM MISC Inject into the skin 2 (two) times daily. 04/05/22   [provider]  Bioflavonoid Products (ESTER-C PO) Take 2 tablets by mouth daily. Immune 500 mg each    [provider]  ezetimibe (ZETIA) 10 MG tablet Take 10 mg by mouth at bedtime.    [provider]  FARXIGA 10 MG TABS tablet Take 10 mg by mouth daily. 04/19/22   [provider]  HYDROcodone-acetaminophen (NORCO) 7.5-325 MG tablet Take 1 tablet by mouth every 6 (six) hours as needed for moderate pain. 07/25/22   Serena Colonel, MD  insulin glargine (LANTUS SOLOSTAR) 100 UNIT/ML Solostar Pen Inject 59 Units into the skin 2 (two) times daily. Before breakfast and at bedtime    [provider]  JANUMET 50-1000 MG tablet Take 1 tablet by mouth 2 (two) times daily.    [provider]  Boris Lown Oil 500 MG CAPS Take 500 mg by mouth daily. Mega Red  omega  3    [provider]  levothyroxine (SYNTHROID) 50 MCG tablet Take 50 mcg by mouth daily before breakfast.    [provider]  lisinopril (ZESTRIL) 10 MG tablet Take 10 mg by mouth daily.    [provider]  metoprolol tartrate (LOPRESSOR) 50 MG tablet Take 50 mg by mouth 2 (two) times daily.    [provider]  Multiple Vitamins-Minerals (MULTIVITAMIN WITH MINERALS) tablet Take 1 tablet by mouth daily. Centrum 50+    [provider]  nateglinide (STARLIX) 120 MG tablet Take 120 mg by mouth 3 (three) times daily.    [provider]  ondansetron (ZOFRAN-ODT) 4 MG disintegrating tablet Take 1 tablet (4 mg total) by mouth every 8 (eight) hours as needed for nausea or vomiting. 07/25/22   Serena Colonel, MD  Polyethyl Glycol-Propyl Glycol (SYSTANE ULTRA) 0.4-0.3 % SOLN Place 1 drop into both eyes 2 (two) times daily.    [provider]  simvastatin (ZOCOR) 20 MG tablet Take 20 mg by mouth at bedtime.    [provider]  tamsulosin (FLOMAX) 0.4 MG CAPS capsule Take 0.4 mg by mouth at bedtime.    [provider]  tobramycin (TOBREX) 0.3 % ophthalmic solution SMARTSIG:In Eye(s) Patient not taking: Reported on 07/09/2022    [provider]      Allergies    Penicillins  Review of Systems   Review of Systems  Constitutional:  Negative for appetite change and fatigue.  HENT:  Negative for congestion, ear discharge and sinus pressure.   Eyes:  Negative for discharge.  Respiratory:  Negative for cough.   Cardiovascular:  Negative for chest pain.  Gastrointestinal:  Negative for abdominal pain and diarrhea.  Genitourinary:  Negative for frequency and hematuria.  Musculoskeletal:  Negative for back pain.  Skin:  Negative for rash.  Neurological:  Positive for weakness. Negative for seizures and headaches.  Psychiatric/Behavioral:  Negative for hallucinations.     Physical Exam Updated Vital Signs BP 124/71    Pulse 100   Temp 98.7 F (37.1 C) (Oral)   Resp (!) 24   Ht 5' 11.5" (1.816 m)   Wt 86.2 kg   SpO2 95%   BMI 26.13 kg/m  Physical Exam Vitals and nursing note reviewed.  Constitutional:      Appearance: He is well-developed.  HENT:     Head: Normocephalic.     Nose: Nose normal.  Eyes:     General: No scleral icterus.    Conjunctiva/sclera: Conjunctivae normal.  Neck:     Thyroid: No thyromegaly.  Cardiovascular:     Rate and Rhythm: Normal rate and regular rhythm.     Heart sounds: No murmur heard.    No friction rub. No gallop.  Pulmonary:     Breath sounds: No stridor. No wheezing or rales.  Chest:     Chest wall: No tenderness.  Abdominal:     General: There is no distension.     Tenderness: There is no abdominal tenderness. There is no rebound.  Musculoskeletal:        General: Normal range of motion.     Cervical back: Neck supple.  Lymphadenopathy:     Cervical: No cervical adenopathy.  Skin:    Findings: No erythema or rash.  Neurological:     Mental Status: He is alert and oriented to person, place, and time.     Motor: No abnormal muscle tone.     Coordination: Coordination normal.  Psychiatric:        Behavior: Behavior normal.     ED Results / Procedures / Treatments   Labs (all labs ordered are listed, but only abnormal results are displayed) Labs Reviewed  COMPREHENSIVE METABOLIC PANEL - Abnormal; Notable for the following components:      Result Value   BUN 29 (*)    Calcium 8.7 (*)    Total Protein 6.2 (*)    All other components within normal limits  CBG MONITORING, ED - Abnormal; Notable for the following components:   Glucose-Capillary 62 (*)    All other components within normal limits  CBG MONITORING, ED - Abnormal; Notable for the following components:   Glucose-Capillary 171 (*)    All other components within normal limits  CBG MONITORING, ED - Abnormal; Notable for the following components:   Glucose-Capillary 155 (*)    All  other components within normal limits  CBC WITH DIFFERENTIAL/PLATELET  CBG MONITORING, ED    EKG None  Radiology No results found.  Procedures Procedures    Medications Ordered in ED Medications  dextrose 50 % solution 50 mL (50 mLs Intravenous Given 10/14/22 0809)    ED Course/ Medical Decision Making/ A&P                             Medical  Decision Making Amount and/or Complexity of Data Reviewed Labs: ordered.  Risk Prescription drug management.  This patient presents to the ED for concern of hypoglycemic episode, this involves an extensive number of treatment options, and is a complaint that carries with it a high risk of complications and morbidity.  The differential diagnosis includes focal ischemia   Co morbidities that complicate the patient evaluation  Diabetes   Additional history obtained:  Additional history obtained from son External records from outside source obtained and reviewed including hospital records   Lab Tests:  I Ordered, and personally interpreted labs.  The pertinent results include: Glucose very low on scene but here his glucose was 85 and then 62   Imaging Studies ordered:  No imaging seen Cardiac Monitoring: / EKG:  The patient was maintained on a cardiac monitor.  I personally viewed and interpreted the cardiac monitored which showed an underlying rhythm of: Sinus rhythm   Consultations Obtained:  No consultant Problem List / ED Course / Critical interventions / Medication management  Hypoglycemia I ordered medication including D50 for hypoglycemia Reevaluation of the patient after these medicines showed that the patient improved I have reviewed the patients home medicines and have made adjustments as needed   Social Determinants of Health:  None   Test / Admission - Considered:  None  Patient with hypoglycemia.  Will decrease his insulin from 58 units twice a day so he is taking 50 units twice a day and he  is going to record his sugars and follow-up with his doctor        Final Clinical Impression(s) / ED Diagnoses Final diagnoses:  None    Rx / DC Orders ED Discharge Orders     None         Bethann Berkshire, MD 10/16/22 [phone]

## 2022-10-14 NOTE — ED Notes (Addendum)
CBG 62. Pt given 4 oz orange juice and peanut butter and graham crackers.

## 2022-10-18 DIAGNOSIS — E1165 Type 2 diabetes mellitus with hyperglycemia: Secondary | ICD-10-CM | POA: Diagnosis not present

## 2022-10-18 DIAGNOSIS — I10 Essential (primary) hypertension: Secondary | ICD-10-CM | POA: Diagnosis not present

## 2022-10-19 DIAGNOSIS — H43812 Vitreous degeneration, left eye: Secondary | ICD-10-CM | POA: Diagnosis not present

## 2022-10-19 DIAGNOSIS — E113293 Type 2 diabetes mellitus with mild nonproliferative diabetic retinopathy without macular edema, bilateral: Secondary | ICD-10-CM | POA: Diagnosis not present

## 2022-10-19 DIAGNOSIS — H353231 Exudative age-related macular degeneration, bilateral, with active choroidal neovascularization: Secondary | ICD-10-CM | POA: Diagnosis not present

## 2022-10-22 ENCOUNTER — Telehealth: Payer: Self-pay

## 2022-10-22 NOTE — Telephone Encounter (Signed)
     Patient  visit on 10/14/2022  at [facility] was for Hypoglycemia.  Have you been able to follow up with your primary care physician? Patient has appointment 10/25/22.  The patient was or was not able to obtain any needed medicine or equipment. No medication prescribed.  Are there diet recommendations that you are having difficulty following? No  Patient expresses understanding of discharge instructions and education provided has no other needs at this time. Yes   [place] Resource Care Guide   [email or web address]  ?? [id number]   Website: [email or web address]  [email or web address]

## 2022-10-25 DIAGNOSIS — E663 Overweight: Secondary | ICD-10-CM | POA: Diagnosis not present

## 2022-10-25 DIAGNOSIS — Z1331 Encounter for screening for depression: Secondary | ICD-10-CM | POA: Diagnosis not present

## 2022-10-25 DIAGNOSIS — E11649 Type 2 diabetes mellitus with hypoglycemia without coma: Secondary | ICD-10-CM | POA: Diagnosis not present

## 2022-10-25 DIAGNOSIS — M5136 Other intervertebral disc degeneration, lumbar region: Secondary | ICD-10-CM | POA: Diagnosis not present

## 2022-10-25 DIAGNOSIS — Z6827 Body mass index (BMI) 27.0-27.9, adult: Secondary | ICD-10-CM | POA: Diagnosis not present

## 2022-10-25 DIAGNOSIS — E162 Hypoglycemia, unspecified: Secondary | ICD-10-CM | POA: Diagnosis not present

## 2022-10-25 DIAGNOSIS — Z0001 Encounter for general adult medical examination with abnormal findings: Secondary | ICD-10-CM | POA: Diagnosis not present

## 2022-10-29 ENCOUNTER — Encounter (HOSPITAL_COMMUNITY)
Admission: RE | Admit: 2022-10-29 | Discharge: 2022-10-29 | Disposition: A | Discharge status: Home / Self Care | Payer: Medicare Other | Source: Ambulatory Visit | Attending: Internal Medicine | Admitting: Internal Medicine

## 2022-10-29 DIAGNOSIS — C73 Malignant neoplasm of thyroid gland: Secondary | ICD-10-CM | POA: Diagnosis not present

## 2022-10-29 MED ORDER — THYROTROPIN ALFA 0.9 MG IM SOLR
0.9000 mg | INTRAMUSCULAR | Status: AC
  Administered 2022-10-29: 0.9 mg via INTRAMUSCULAR

## 2022-10-29 MED ORDER — STERILE WATER FOR INJECTION IJ SOLN
INTRAMUSCULAR | Status: AC
  Filled 2022-10-29: qty 10

## 2022-10-30 ENCOUNTER — Encounter (HOSPITAL_COMMUNITY)
Admission: RE | Admit: 2022-10-30 | Discharge: 2022-10-30 | Disposition: A | Discharge status: Home / Self Care | Payer: Medicare Other | Source: Ambulatory Visit | Attending: Internal Medicine | Admitting: Internal Medicine

## 2022-10-30 DIAGNOSIS — C73 Malignant neoplasm of thyroid gland: Secondary | ICD-10-CM | POA: Diagnosis not present

## 2022-10-30 MED ORDER — STERILE WATER FOR INJECTION IJ SOLN
INTRAMUSCULAR | Status: AC
  Administered 2022-10-30: 1.2 mL via INTRAMUSCULAR
  Filled 2022-10-30: qty 10

## 2022-10-30 MED ORDER — THYROTROPIN ALFA 0.9 MG IM SOLR
0.9000 mg | INTRAMUSCULAR | Status: AC
  Administered 2022-10-30: 0.9 mg via INTRAMUSCULAR

## 2022-10-30 MED ORDER — STERILE WATER FOR INJECTION IJ SOLN: 1.2000 mL | Freq: Once | INTRAMUSCULAR | Status: AC

## 2022-10-31 ENCOUNTER — Encounter (HOSPITAL_COMMUNITY)
Admission: RE | Admit: 2022-10-31 | Discharge: 2022-10-31 | Disposition: A | Discharge status: Home / Self Care | Payer: Medicare Other | Source: Ambulatory Visit | Attending: Internal Medicine | Admitting: Internal Medicine

## 2022-10-31 DIAGNOSIS — C73 Malignant neoplasm of thyroid gland: Secondary | ICD-10-CM | POA: Diagnosis not present

## 2022-10-31 MED ORDER — SODIUM IODIDE I 131 CAPSULE
116.0000 | Freq: Once | INTRAVENOUS | Status: AC | PRN
  Administered 2022-10-31: 116 via ORAL

## 2022-11-09 ENCOUNTER — Encounter (HOSPITAL_COMMUNITY)
Admission: RE | Admit: 2022-11-09 | Discharge: 2022-11-09 | Disposition: A | Discharge status: Home / Self Care | Payer: Medicare Other | Source: Ambulatory Visit | Attending: Internal Medicine | Admitting: Internal Medicine

## 2022-11-09 DIAGNOSIS — C73 Malignant neoplasm of thyroid gland: Secondary | ICD-10-CM | POA: Diagnosis not present

## 2022-11-10 DIAGNOSIS — C73 Malignant neoplasm of thyroid gland: Secondary | ICD-10-CM | POA: Diagnosis not present

## 2022-11-13 DIAGNOSIS — C7800 Secondary malignant neoplasm of unspecified lung: Secondary | ICD-10-CM | POA: Diagnosis not present

## 2022-11-13 DIAGNOSIS — E538 Deficiency of other specified B group vitamins: Secondary | ICD-10-CM | POA: Diagnosis not present

## 2022-11-13 DIAGNOSIS — D7589 Other specified diseases of blood and blood-forming organs: Secondary | ICD-10-CM | POA: Diagnosis not present

## 2022-11-13 DIAGNOSIS — C770 Secondary and unspecified malignant neoplasm of lymph nodes of head, face and neck: Secondary | ICD-10-CM | POA: Diagnosis not present

## 2022-11-13 DIAGNOSIS — R946 Abnormal results of thyroid function studies: Secondary | ICD-10-CM | POA: Diagnosis not present

## 2022-11-13 DIAGNOSIS — C73 Malignant neoplasm of thyroid gland: Secondary | ICD-10-CM | POA: Diagnosis not present

## 2022-12-19 DIAGNOSIS — E113293 Type 2 diabetes mellitus with mild nonproliferative diabetic retinopathy without macular edema, bilateral: Secondary | ICD-10-CM | POA: Diagnosis not present

## 2022-12-19 DIAGNOSIS — H353231 Exudative age-related macular degeneration, bilateral, with active choroidal neovascularization: Secondary | ICD-10-CM | POA: Diagnosis not present

## 2022-12-19 DIAGNOSIS — H43812 Vitreous degeneration, left eye: Secondary | ICD-10-CM | POA: Diagnosis not present

## 2023-01-01 DIAGNOSIS — C73 Malignant neoplasm of thyroid gland: Secondary | ICD-10-CM | POA: Diagnosis not present

## 2023-01-18 ENCOUNTER — Other Ambulatory Visit: Payer: Self-pay | Admitting: Internal Medicine

## 2023-01-18 DIAGNOSIS — C770 Secondary and unspecified malignant neoplasm of lymph nodes of head, face and neck: Secondary | ICD-10-CM

## 2023-01-18 DIAGNOSIS — E1165 Type 2 diabetes mellitus with hyperglycemia: Secondary | ICD-10-CM | POA: Diagnosis not present

## 2023-01-18 DIAGNOSIS — E782 Mixed hyperlipidemia: Secondary | ICD-10-CM | POA: Diagnosis not present

## 2023-01-18 DIAGNOSIS — C73 Malignant neoplasm of thyroid gland: Secondary | ICD-10-CM

## 2023-01-18 DIAGNOSIS — C7800 Secondary malignant neoplasm of unspecified lung: Secondary | ICD-10-CM

## 2023-01-18 DIAGNOSIS — E039 Hypothyroidism, unspecified: Secondary | ICD-10-CM | POA: Diagnosis not present

## 2023-01-18 DIAGNOSIS — I10 Essential (primary) hypertension: Secondary | ICD-10-CM | POA: Diagnosis not present

## 2023-02-15 DIAGNOSIS — C770 Secondary and unspecified malignant neoplasm of lymph nodes of head, face and neck: Secondary | ICD-10-CM | POA: Diagnosis not present

## 2023-02-15 DIAGNOSIS — C73 Malignant neoplasm of thyroid gland: Secondary | ICD-10-CM | POA: Diagnosis not present

## 2023-02-15 DIAGNOSIS — C7800 Secondary malignant neoplasm of unspecified lung: Secondary | ICD-10-CM | POA: Diagnosis not present

## 2023-02-15 DIAGNOSIS — R946 Abnormal results of thyroid function studies: Secondary | ICD-10-CM | POA: Diagnosis not present

## 2023-02-15 DIAGNOSIS — Z8639 Personal history of other endocrine, nutritional and metabolic disease: Secondary | ICD-10-CM | POA: Diagnosis not present

## 2023-02-18 ENCOUNTER — Ambulatory Visit
Admission: RE | Admit: 2023-02-18 | Discharge: 2023-02-18 | Disposition: A | Discharge status: Home / Self Care | Payer: Medicare Other | Source: Ambulatory Visit | Attending: Internal Medicine | Admitting: Internal Medicine

## 2023-02-18 ENCOUNTER — Other Ambulatory Visit: Payer: Medicare Other

## 2023-02-18 DIAGNOSIS — K449 Diaphragmatic hernia without obstruction or gangrene: Secondary | ICD-10-CM | POA: Diagnosis not present

## 2023-02-18 DIAGNOSIS — C7800 Secondary malignant neoplasm of unspecified lung: Secondary | ICD-10-CM

## 2023-02-18 DIAGNOSIS — C73 Malignant neoplasm of thyroid gland: Secondary | ICD-10-CM

## 2023-02-18 DIAGNOSIS — R918 Other nonspecific abnormal finding of lung field: Secondary | ICD-10-CM | POA: Diagnosis not present

## 2023-02-18 DIAGNOSIS — C770 Secondary and unspecified malignant neoplasm of lymph nodes of head, face and neck: Secondary | ICD-10-CM

## 2023-02-18 DIAGNOSIS — I700 Atherosclerosis of aorta: Secondary | ICD-10-CM | POA: Diagnosis not present

## 2023-02-18 MED ORDER — IOPAMIDOL (ISOVUE-300) INJECTION 61%
75.0000 mL | Freq: Once | INTRAVENOUS | Status: AC | PRN
  Administered 2023-02-18: 75 mL via INTRAVENOUS

## 2023-02-28 DIAGNOSIS — C73 Malignant neoplasm of thyroid gland: Secondary | ICD-10-CM | POA: Diagnosis not present

## 2023-02-28 DIAGNOSIS — I700 Atherosclerosis of aorta: Secondary | ICD-10-CM | POA: Diagnosis not present

## 2023-02-28 DIAGNOSIS — K449 Diaphragmatic hernia without obstruction or gangrene: Secondary | ICD-10-CM | POA: Diagnosis not present

## 2023-02-28 DIAGNOSIS — C7800 Secondary malignant neoplasm of unspecified lung: Secondary | ICD-10-CM | POA: Diagnosis not present

## 2023-02-28 DIAGNOSIS — M4850XA Collapsed vertebra, not elsewhere classified, site unspecified, initial encounter for fracture: Secondary | ICD-10-CM | POA: Diagnosis not present

## 2023-02-28 DIAGNOSIS — R946 Abnormal results of thyroid function studies: Secondary | ICD-10-CM | POA: Diagnosis not present

## 2023-02-28 DIAGNOSIS — C770 Secondary and unspecified malignant neoplasm of lymph nodes of head, face and neck: Secondary | ICD-10-CM | POA: Diagnosis not present

## 2023-03-01 DIAGNOSIS — H43812 Vitreous degeneration, left eye: Secondary | ICD-10-CM | POA: Diagnosis not present

## 2023-03-01 DIAGNOSIS — E113293 Type 2 diabetes mellitus with mild nonproliferative diabetic retinopathy without macular edema, bilateral: Secondary | ICD-10-CM | POA: Diagnosis not present

## 2023-03-01 DIAGNOSIS — H353231 Exudative age-related macular degeneration, bilateral, with active choroidal neovascularization: Secondary | ICD-10-CM | POA: Diagnosis not present

## 2023-03-12 ENCOUNTER — Other Ambulatory Visit: Payer: Self-pay | Admitting: Internal Medicine

## 2023-03-12 DIAGNOSIS — S129XXA Fracture of neck, unspecified, initial encounter: Secondary | ICD-10-CM

## 2023-04-19 DIAGNOSIS — E291 Testicular hypofunction: Secondary | ICD-10-CM | POA: Diagnosis not present

## 2023-04-19 DIAGNOSIS — C73 Malignant neoplasm of thyroid gland: Secondary | ICD-10-CM | POA: Diagnosis not present

## 2023-04-19 DIAGNOSIS — M4850XA Collapsed vertebra, not elsewhere classified, site unspecified, initial encounter for fracture: Secondary | ICD-10-CM | POA: Diagnosis not present

## 2023-04-20 DIAGNOSIS — I10 Essential (primary) hypertension: Secondary | ICD-10-CM | POA: Diagnosis not present

## 2023-04-20 DIAGNOSIS — E1159 Type 2 diabetes mellitus with other circulatory complications: Secondary | ICD-10-CM | POA: Diagnosis not present

## 2023-04-20 DIAGNOSIS — E782 Mixed hyperlipidemia: Secondary | ICD-10-CM | POA: Diagnosis not present

## 2023-04-26 ENCOUNTER — Other Ambulatory Visit (HOSPITAL_COMMUNITY): Payer: Self-pay | Admitting: Internal Medicine

## 2023-04-26 DIAGNOSIS — M810 Age-related osteoporosis without current pathological fracture: Secondary | ICD-10-CM | POA: Diagnosis not present

## 2023-04-26 DIAGNOSIS — E221 Hyperprolactinemia: Secondary | ICD-10-CM

## 2023-04-26 DIAGNOSIS — C73 Malignant neoplasm of thyroid gland: Secondary | ICD-10-CM | POA: Diagnosis not present

## 2023-04-26 DIAGNOSIS — E291 Testicular hypofunction: Secondary | ICD-10-CM | POA: Diagnosis not present

## 2023-04-26 DIAGNOSIS — I700 Atherosclerosis of aorta: Secondary | ICD-10-CM | POA: Diagnosis not present

## 2023-04-26 DIAGNOSIS — R7989 Other specified abnormal findings of blood chemistry: Secondary | ICD-10-CM

## 2023-04-26 DIAGNOSIS — C7800 Secondary malignant neoplasm of unspecified lung: Secondary | ICD-10-CM | POA: Diagnosis not present

## 2023-04-26 DIAGNOSIS — C770 Secondary and unspecified malignant neoplasm of lymph nodes of head, face and neck: Secondary | ICD-10-CM | POA: Diagnosis not present

## 2023-04-26 DIAGNOSIS — R946 Abnormal results of thyroid function studies: Secondary | ICD-10-CM | POA: Diagnosis not present

## 2023-05-06 ENCOUNTER — Ambulatory Visit (HOSPITAL_COMMUNITY)
Admission: RE | Admit: 2023-05-06 | Discharge: 2023-05-06 | Disposition: A | Discharge status: Home / Self Care | Payer: Medicare Other | Source: Ambulatory Visit | Attending: Internal Medicine | Admitting: Internal Medicine

## 2023-05-06 DIAGNOSIS — I6782 Cerebral ischemia: Secondary | ICD-10-CM | POA: Diagnosis not present

## 2023-05-06 DIAGNOSIS — R7989 Other specified abnormal findings of blood chemistry: Secondary | ICD-10-CM | POA: Diagnosis not present

## 2023-05-06 DIAGNOSIS — E221 Hyperprolactinemia: Secondary | ICD-10-CM | POA: Insufficient documentation

## 2023-05-06 DIAGNOSIS — G319 Degenerative disease of nervous system, unspecified: Secondary | ICD-10-CM | POA: Diagnosis not present

## 2023-05-06 DIAGNOSIS — R220 Localized swelling, mass and lump, head: Secondary | ICD-10-CM | POA: Diagnosis not present

## 2023-05-06 MED ORDER — GADOBUTROL 1 MMOL/ML IV SOLN
7.0000 mL | Freq: Once | INTRAVENOUS | Status: AC | PRN
  Administered 2023-05-06: 7 mL via INTRAVENOUS

## 2023-05-10 DIAGNOSIS — H43812 Vitreous degeneration, left eye: Secondary | ICD-10-CM | POA: Diagnosis not present

## 2023-05-10 DIAGNOSIS — H353231 Exudative age-related macular degeneration, bilateral, with active choroidal neovascularization: Secondary | ICD-10-CM | POA: Diagnosis not present

## 2023-05-10 DIAGNOSIS — E113293 Type 2 diabetes mellitus with mild nonproliferative diabetic retinopathy without macular edema, bilateral: Secondary | ICD-10-CM | POA: Diagnosis not present

## 2023-05-30 ENCOUNTER — Ambulatory Visit (HOSPITAL_COMMUNITY)
Admission: RE | Admit: 2023-05-30 | Discharge: 2023-05-30 | Disposition: A | Discharge status: Home / Self Care | Payer: Medicare Other | Source: Ambulatory Visit | Attending: Internal Medicine | Admitting: Internal Medicine

## 2023-05-30 DIAGNOSIS — S129XXA Fracture of neck, unspecified, initial encounter: Secondary | ICD-10-CM | POA: Insufficient documentation

## 2023-05-30 DIAGNOSIS — M810 Age-related osteoporosis without current pathological fracture: Secondary | ICD-10-CM | POA: Diagnosis not present

## 2023-06-05 DIAGNOSIS — D352 Benign neoplasm of pituitary gland: Secondary | ICD-10-CM | POA: Diagnosis not present

## 2023-06-05 DIAGNOSIS — C770 Secondary and unspecified malignant neoplasm of lymph nodes of head, face and neck: Secondary | ICD-10-CM | POA: Diagnosis not present

## 2023-06-05 DIAGNOSIS — M810 Age-related osteoporosis without current pathological fracture: Secondary | ICD-10-CM | POA: Diagnosis not present

## 2023-06-05 DIAGNOSIS — Z23 Encounter for immunization: Secondary | ICD-10-CM | POA: Diagnosis not present

## 2023-06-05 DIAGNOSIS — E119 Type 2 diabetes mellitus without complications: Secondary | ICD-10-CM | POA: Diagnosis not present

## 2023-06-05 DIAGNOSIS — R946 Abnormal results of thyroid function studies: Secondary | ICD-10-CM | POA: Diagnosis not present

## 2023-06-05 DIAGNOSIS — C7800 Secondary malignant neoplasm of unspecified lung: Secondary | ICD-10-CM | POA: Diagnosis not present

## 2023-06-05 DIAGNOSIS — I700 Atherosclerosis of aorta: Secondary | ICD-10-CM | POA: Diagnosis not present

## 2023-06-05 DIAGNOSIS — E221 Hyperprolactinemia: Secondary | ICD-10-CM | POA: Diagnosis not present

## 2023-06-05 DIAGNOSIS — E291 Testicular hypofunction: Secondary | ICD-10-CM | POA: Diagnosis not present

## 2023-06-05 DIAGNOSIS — C73 Malignant neoplasm of thyroid gland: Secondary | ICD-10-CM | POA: Diagnosis not present

## 2023-07-12 DIAGNOSIS — H353231 Exudative age-related macular degeneration, bilateral, with active choroidal neovascularization: Secondary | ICD-10-CM | POA: Diagnosis not present

## 2023-07-12 DIAGNOSIS — E113293 Type 2 diabetes mellitus with mild nonproliferative diabetic retinopathy without macular edema, bilateral: Secondary | ICD-10-CM | POA: Diagnosis not present

## 2023-07-12 DIAGNOSIS — H43812 Vitreous degeneration, left eye: Secondary | ICD-10-CM | POA: Diagnosis not present

## 2023-07-26 DIAGNOSIS — M810 Age-related osteoporosis without current pathological fracture: Secondary | ICD-10-CM | POA: Diagnosis not present

## 2023-07-26 DIAGNOSIS — C73 Malignant neoplasm of thyroid gland: Secondary | ICD-10-CM | POA: Diagnosis not present

## 2023-07-26 DIAGNOSIS — I700 Atherosclerosis of aorta: Secondary | ICD-10-CM | POA: Diagnosis not present

## 2023-07-26 DIAGNOSIS — C7800 Secondary malignant neoplasm of unspecified lung: Secondary | ICD-10-CM | POA: Diagnosis not present

## 2023-07-26 DIAGNOSIS — C770 Secondary and unspecified malignant neoplasm of lymph nodes of head, face and neck: Secondary | ICD-10-CM | POA: Diagnosis not present

## 2023-07-26 DIAGNOSIS — R946 Abnormal results of thyroid function studies: Secondary | ICD-10-CM | POA: Diagnosis not present

## 2023-07-26 DIAGNOSIS — E291 Testicular hypofunction: Secondary | ICD-10-CM | POA: Diagnosis not present

## 2023-07-26 DIAGNOSIS — D352 Benign neoplasm of pituitary gland: Secondary | ICD-10-CM | POA: Diagnosis not present

## 2023-07-26 DIAGNOSIS — E221 Hyperprolactinemia: Secondary | ICD-10-CM | POA: Diagnosis not present

## 2023-07-26 DIAGNOSIS — E1140 Type 2 diabetes mellitus with diabetic neuropathy, unspecified: Secondary | ICD-10-CM | POA: Diagnosis not present

## 2023-07-26 DIAGNOSIS — E119 Type 2 diabetes mellitus without complications: Secondary | ICD-10-CM | POA: Diagnosis not present

## 2023-09-13 DIAGNOSIS — E113293 Type 2 diabetes mellitus with mild nonproliferative diabetic retinopathy without macular edema, bilateral: Secondary | ICD-10-CM | POA: Diagnosis not present

## 2023-09-13 DIAGNOSIS — H353231 Exudative age-related macular degeneration, bilateral, with active choroidal neovascularization: Secondary | ICD-10-CM | POA: Diagnosis not present

## 2023-09-13 DIAGNOSIS — H43813 Vitreous degeneration, bilateral: Secondary | ICD-10-CM | POA: Diagnosis not present

## 2023-10-25 DIAGNOSIS — E1140 Type 2 diabetes mellitus with diabetic neuropathy, unspecified: Secondary | ICD-10-CM | POA: Diagnosis not present

## 2023-10-25 DIAGNOSIS — R946 Abnormal results of thyroid function studies: Secondary | ICD-10-CM | POA: Diagnosis not present

## 2023-10-25 DIAGNOSIS — C73 Malignant neoplasm of thyroid gland: Secondary | ICD-10-CM | POA: Diagnosis not present

## 2023-10-25 DIAGNOSIS — M810 Age-related osteoporosis without current pathological fracture: Secondary | ICD-10-CM | POA: Diagnosis not present

## 2023-10-25 DIAGNOSIS — E221 Hyperprolactinemia: Secondary | ICD-10-CM | POA: Diagnosis not present

## 2023-10-25 DIAGNOSIS — C7800 Secondary malignant neoplasm of unspecified lung: Secondary | ICD-10-CM | POA: Diagnosis not present

## 2023-10-25 DIAGNOSIS — C770 Secondary and unspecified malignant neoplasm of lymph nodes of head, face and neck: Secondary | ICD-10-CM | POA: Diagnosis not present

## 2023-10-25 DIAGNOSIS — D352 Benign neoplasm of pituitary gland: Secondary | ICD-10-CM | POA: Diagnosis not present

## 2023-11-15 DIAGNOSIS — H43813 Vitreous degeneration, bilateral: Secondary | ICD-10-CM | POA: Diagnosis not present

## 2023-11-15 DIAGNOSIS — E113293 Type 2 diabetes mellitus with mild nonproliferative diabetic retinopathy without macular edema, bilateral: Secondary | ICD-10-CM | POA: Diagnosis not present

## 2023-11-15 DIAGNOSIS — H353231 Exudative age-related macular degeneration, bilateral, with active choroidal neovascularization: Secondary | ICD-10-CM | POA: Diagnosis not present

## 2023-12-31 DIAGNOSIS — M5134 Other intervertebral disc degeneration, thoracic region: Secondary | ICD-10-CM | POA: Diagnosis not present

## 2023-12-31 DIAGNOSIS — I10 Essential (primary) hypertension: Secondary | ICD-10-CM | POA: Diagnosis not present

## 2023-12-31 DIAGNOSIS — Z6822 Body mass index (BMI) 22.0-22.9, adult: Secondary | ICD-10-CM | POA: Diagnosis not present

## 2023-12-31 DIAGNOSIS — E1159 Type 2 diabetes mellitus with other circulatory complications: Secondary | ICD-10-CM | POA: Diagnosis not present

## 2023-12-31 DIAGNOSIS — E1165 Type 2 diabetes mellitus with hyperglycemia: Secondary | ICD-10-CM | POA: Diagnosis not present

## 2023-12-31 DIAGNOSIS — E782 Mixed hyperlipidemia: Secondary | ICD-10-CM | POA: Diagnosis not present

## 2023-12-31 DIAGNOSIS — C73 Malignant neoplasm of thyroid gland: Secondary | ICD-10-CM | POA: Diagnosis not present

## 2024-01-24 DIAGNOSIS — E113293 Type 2 diabetes mellitus with mild nonproliferative diabetic retinopathy without macular edema, bilateral: Secondary | ICD-10-CM | POA: Diagnosis not present

## 2024-01-24 DIAGNOSIS — H353232 Exudative age-related macular degeneration, bilateral, with inactive choroidal neovascularization: Secondary | ICD-10-CM | POA: Diagnosis not present

## 2024-01-24 DIAGNOSIS — H43813 Vitreous degeneration, bilateral: Secondary | ICD-10-CM | POA: Diagnosis not present

## 2024-02-07 DIAGNOSIS — R946 Abnormal results of thyroid function studies: Secondary | ICD-10-CM | POA: Diagnosis not present

## 2024-02-07 DIAGNOSIS — C73 Malignant neoplasm of thyroid gland: Secondary | ICD-10-CM | POA: Diagnosis not present

## 2024-02-07 DIAGNOSIS — E221 Hyperprolactinemia: Secondary | ICD-10-CM | POA: Diagnosis not present

## 2024-02-07 DIAGNOSIS — C7800 Secondary malignant neoplasm of unspecified lung: Secondary | ICD-10-CM | POA: Diagnosis not present

## 2024-02-07 DIAGNOSIS — C770 Secondary and unspecified malignant neoplasm of lymph nodes of head, face and neck: Secondary | ICD-10-CM | POA: Diagnosis not present

## 2024-02-07 DIAGNOSIS — D352 Benign neoplasm of pituitary gland: Secondary | ICD-10-CM | POA: Diagnosis not present

## 2024-02-07 DIAGNOSIS — M810 Age-related osteoporosis without current pathological fracture: Secondary | ICD-10-CM | POA: Diagnosis not present

## 2024-02-07 DIAGNOSIS — E1140 Type 2 diabetes mellitus with diabetic neuropathy, unspecified: Secondary | ICD-10-CM | POA: Diagnosis not present

## 2024-03-03 DIAGNOSIS — I10 Essential (primary) hypertension: Secondary | ICD-10-CM | POA: Diagnosis not present

## 2024-03-03 DIAGNOSIS — C73 Malignant neoplasm of thyroid gland: Secondary | ICD-10-CM | POA: Diagnosis not present

## 2024-03-03 DIAGNOSIS — E039 Hypothyroidism, unspecified: Secondary | ICD-10-CM | POA: Diagnosis not present

## 2024-03-03 DIAGNOSIS — E1159 Type 2 diabetes mellitus with other circulatory complications: Secondary | ICD-10-CM | POA: Diagnosis not present

## 2024-03-03 DIAGNOSIS — Z6821 Body mass index (BMI) 21.0-21.9, adult: Secondary | ICD-10-CM | POA: Diagnosis not present

## 2024-03-03 DIAGNOSIS — E782 Mixed hyperlipidemia: Secondary | ICD-10-CM | POA: Diagnosis not present

## 2024-03-19 DIAGNOSIS — E1159 Type 2 diabetes mellitus with other circulatory complications: Secondary | ICD-10-CM | POA: Diagnosis not present

## 2024-03-19 DIAGNOSIS — E782 Mixed hyperlipidemia: Secondary | ICD-10-CM | POA: Diagnosis not present

## 2024-04-03 DIAGNOSIS — H43813 Vitreous degeneration, bilateral: Secondary | ICD-10-CM | POA: Diagnosis not present

## 2024-04-03 DIAGNOSIS — E113293 Type 2 diabetes mellitus with mild nonproliferative diabetic retinopathy without macular edema, bilateral: Secondary | ICD-10-CM | POA: Diagnosis not present

## 2024-04-03 DIAGNOSIS — H35371 Puckering of macula, right eye: Secondary | ICD-10-CM | POA: Diagnosis not present

## 2024-04-03 DIAGNOSIS — H353232 Exudative age-related macular degeneration, bilateral, with inactive choroidal neovascularization: Secondary | ICD-10-CM | POA: Diagnosis not present

## 2024-04-09 DIAGNOSIS — D044 Carcinoma in situ of skin of scalp and neck: Secondary | ICD-10-CM | POA: Diagnosis not present

## 2024-05-05 DIAGNOSIS — I10 Essential (primary) hypertension: Secondary | ICD-10-CM | POA: Diagnosis not present

## 2024-05-05 DIAGNOSIS — Z9181 History of falling: Secondary | ICD-10-CM | POA: Diagnosis not present

## 2024-05-05 DIAGNOSIS — C73 Malignant neoplasm of thyroid gland: Secondary | ICD-10-CM | POA: Diagnosis not present

## 2024-05-05 DIAGNOSIS — E119 Type 2 diabetes mellitus without complications: Secondary | ICD-10-CM | POA: Diagnosis not present

## 2024-05-05 DIAGNOSIS — C7800 Secondary malignant neoplasm of unspecified lung: Secondary | ICD-10-CM | POA: Diagnosis not present

## 2024-05-05 DIAGNOSIS — R600 Localized edema: Secondary | ICD-10-CM | POA: Diagnosis not present

## 2024-05-07 DIAGNOSIS — Z85828 Personal history of other malignant neoplasm of skin: Secondary | ICD-10-CM | POA: Diagnosis not present

## 2024-05-07 DIAGNOSIS — L8990 Pressure ulcer of unspecified site, unspecified stage: Secondary | ICD-10-CM | POA: Diagnosis not present

## 2024-05-07 DIAGNOSIS — Z08 Encounter for follow-up examination after completed treatment for malignant neoplasm: Secondary | ICD-10-CM | POA: Diagnosis not present

## 2024-05-08 DIAGNOSIS — D352 Benign neoplasm of pituitary gland: Secondary | ICD-10-CM | POA: Diagnosis not present

## 2024-05-08 DIAGNOSIS — E1140 Type 2 diabetes mellitus with diabetic neuropathy, unspecified: Secondary | ICD-10-CM | POA: Diagnosis not present

## 2024-05-08 DIAGNOSIS — C7800 Secondary malignant neoplasm of unspecified lung: Secondary | ICD-10-CM | POA: Diagnosis not present

## 2024-05-08 DIAGNOSIS — C73 Malignant neoplasm of thyroid gland: Secondary | ICD-10-CM | POA: Diagnosis not present

## 2024-05-08 DIAGNOSIS — M810 Age-related osteoporosis without current pathological fracture: Secondary | ICD-10-CM | POA: Diagnosis not present

## 2024-05-08 DIAGNOSIS — Z8639 Personal history of other endocrine, nutritional and metabolic disease: Secondary | ICD-10-CM | POA: Diagnosis not present

## 2024-05-08 DIAGNOSIS — E221 Hyperprolactinemia: Secondary | ICD-10-CM | POA: Diagnosis not present

## 2024-05-08 DIAGNOSIS — R197 Diarrhea, unspecified: Secondary | ICD-10-CM | POA: Diagnosis not present

## 2024-05-08 DIAGNOSIS — C770 Secondary and unspecified malignant neoplasm of lymph nodes of head, face and neck: Secondary | ICD-10-CM | POA: Diagnosis not present

## 2024-05-08 DIAGNOSIS — R946 Abnormal results of thyroid function studies: Secondary | ICD-10-CM | POA: Diagnosis not present

## 2024-05-08 DIAGNOSIS — R600 Localized edema: Secondary | ICD-10-CM | POA: Diagnosis not present

## 2024-05-10 ENCOUNTER — Other Ambulatory Visit: Payer: Self-pay

## 2024-05-10 ENCOUNTER — Inpatient Hospital Stay (HOSPITAL_COMMUNITY)
Admission: EM | Admit: 2024-05-10 | Discharge: 2024-05-15 | DRG: 871 | Disposition: A | Discharge status: Hospice | Attending: Internal Medicine | Admitting: Internal Medicine

## 2024-05-10 ENCOUNTER — Emergency Department (HOSPITAL_COMMUNITY)

## 2024-05-10 ENCOUNTER — Inpatient Hospital Stay (HOSPITAL_COMMUNITY)

## 2024-05-10 ENCOUNTER — Encounter (HOSPITAL_COMMUNITY): Payer: Self-pay

## 2024-05-10 DIAGNOSIS — L8990 Pressure ulcer of unspecified site, unspecified stage: Secondary | ICD-10-CM | POA: Insufficient documentation

## 2024-05-10 DIAGNOSIS — R0989 Other specified symptoms and signs involving the circulatory and respiratory systems: Secondary | ICD-10-CM | POA: Diagnosis not present

## 2024-05-10 DIAGNOSIS — J910 Malignant pleural effusion: Secondary | ICD-10-CM | POA: Diagnosis not present

## 2024-05-10 DIAGNOSIS — R0682 Tachypnea, not elsewhere classified: Secondary | ICD-10-CM | POA: Diagnosis not present

## 2024-05-10 DIAGNOSIS — Z66 Do not resuscitate: Secondary | ICD-10-CM | POA: Diagnosis not present

## 2024-05-10 DIAGNOSIS — J9601 Acute respiratory failure with hypoxia: Secondary | ICD-10-CM | POA: Diagnosis present

## 2024-05-10 DIAGNOSIS — M19072 Primary osteoarthritis, left ankle and foot: Secondary | ICD-10-CM | POA: Diagnosis not present

## 2024-05-10 DIAGNOSIS — I110 Hypertensive heart disease with heart failure: Secondary | ICD-10-CM | POA: Diagnosis present

## 2024-05-10 DIAGNOSIS — C779 Secondary and unspecified malignant neoplasm of lymph node, unspecified: Secondary | ICD-10-CM | POA: Diagnosis not present

## 2024-05-10 DIAGNOSIS — R54 Age-related physical debility: Secondary | ICD-10-CM | POA: Diagnosis present

## 2024-05-10 DIAGNOSIS — N401 Enlarged prostate with lower urinary tract symptoms: Secondary | ICD-10-CM | POA: Diagnosis not present

## 2024-05-10 DIAGNOSIS — J811 Chronic pulmonary edema: Secondary | ICD-10-CM | POA: Diagnosis not present

## 2024-05-10 DIAGNOSIS — R1310 Dysphagia, unspecified: Secondary | ICD-10-CM | POA: Diagnosis not present

## 2024-05-10 DIAGNOSIS — R600 Localized edema: Secondary | ICD-10-CM | POA: Diagnosis not present

## 2024-05-10 DIAGNOSIS — W19XXXA Unspecified fall, initial encounter: Secondary | ICD-10-CM | POA: Diagnosis present

## 2024-05-10 DIAGNOSIS — Z87891 Personal history of nicotine dependence: Secondary | ICD-10-CM

## 2024-05-10 DIAGNOSIS — J929 Pleural plaque without asbestos: Secondary | ICD-10-CM | POA: Diagnosis not present

## 2024-05-10 DIAGNOSIS — J90 Pleural effusion, not elsewhere classified: Secondary | ICD-10-CM

## 2024-05-10 DIAGNOSIS — E785 Hyperlipidemia, unspecified: Secondary | ICD-10-CM | POA: Diagnosis present

## 2024-05-10 DIAGNOSIS — M79662 Pain in left lower leg: Secondary | ICD-10-CM | POA: Diagnosis not present

## 2024-05-10 DIAGNOSIS — I10 Essential (primary) hypertension: Secondary | ICD-10-CM | POA: Diagnosis not present

## 2024-05-10 DIAGNOSIS — R410 Disorientation, unspecified: Secondary | ICD-10-CM | POA: Diagnosis not present

## 2024-05-10 DIAGNOSIS — N320 Bladder-neck obstruction: Secondary | ICD-10-CM | POA: Diagnosis present

## 2024-05-10 DIAGNOSIS — Z79899 Other long term (current) drug therapy: Secondary | ICD-10-CM

## 2024-05-10 DIAGNOSIS — R338 Other retention of urine: Secondary | ICD-10-CM | POA: Diagnosis present

## 2024-05-10 DIAGNOSIS — R918 Other nonspecific abnormal finding of lung field: Secondary | ICD-10-CM | POA: Diagnosis not present

## 2024-05-10 DIAGNOSIS — Z9842 Cataract extraction status, left eye: Secondary | ICD-10-CM

## 2024-05-10 DIAGNOSIS — E860 Dehydration: Secondary | ICD-10-CM | POA: Diagnosis present

## 2024-05-10 DIAGNOSIS — I517 Cardiomegaly: Secondary | ICD-10-CM | POA: Diagnosis not present

## 2024-05-10 DIAGNOSIS — K921 Melena: Secondary | ICD-10-CM | POA: Diagnosis not present

## 2024-05-10 DIAGNOSIS — Z794 Long term (current) use of insulin: Secondary | ICD-10-CM | POA: Diagnosis not present

## 2024-05-10 DIAGNOSIS — R262 Difficulty in walking, not elsewhere classified: Secondary | ICD-10-CM

## 2024-05-10 DIAGNOSIS — R52 Pain, unspecified: Secondary | ICD-10-CM | POA: Diagnosis not present

## 2024-05-10 DIAGNOSIS — I509 Heart failure, unspecified: Secondary | ICD-10-CM | POA: Diagnosis not present

## 2024-05-10 DIAGNOSIS — E876 Hypokalemia: Secondary | ICD-10-CM | POA: Diagnosis present

## 2024-05-10 DIAGNOSIS — N179 Acute kidney failure, unspecified: Secondary | ICD-10-CM | POA: Diagnosis present

## 2024-05-10 DIAGNOSIS — R339 Retention of urine, unspecified: Secondary | ICD-10-CM

## 2024-05-10 DIAGNOSIS — Z48813 Encounter for surgical aftercare following surgery on the respiratory system: Secondary | ICD-10-CM | POA: Diagnosis not present

## 2024-05-10 DIAGNOSIS — S0990XA Unspecified injury of head, initial encounter: Secondary | ICD-10-CM | POA: Diagnosis present

## 2024-05-10 DIAGNOSIS — Z7984 Long term (current) use of oral hypoglycemic drugs: Secondary | ICD-10-CM

## 2024-05-10 DIAGNOSIS — E870 Hyperosmolality and hypernatremia: Secondary | ICD-10-CM | POA: Diagnosis not present

## 2024-05-10 DIAGNOSIS — Z880 Allergy status to penicillin: Secondary | ICD-10-CM

## 2024-05-10 DIAGNOSIS — R0603 Acute respiratory distress: Secondary | ICD-10-CM | POA: Diagnosis not present

## 2024-05-10 DIAGNOSIS — Z85850 Personal history of malignant neoplasm of thyroid: Secondary | ICD-10-CM

## 2024-05-10 DIAGNOSIS — C73 Malignant neoplasm of thyroid gland: Secondary | ICD-10-CM | POA: Diagnosis not present

## 2024-05-10 DIAGNOSIS — J810 Acute pulmonary edema: Secondary | ICD-10-CM | POA: Diagnosis not present

## 2024-05-10 DIAGNOSIS — E43 Unspecified severe protein-calorie malnutrition: Secondary | ICD-10-CM | POA: Diagnosis not present

## 2024-05-10 DIAGNOSIS — E0590 Thyrotoxicosis, unspecified without thyrotoxic crisis or storm: Secondary | ICD-10-CM | POA: Diagnosis present

## 2024-05-10 DIAGNOSIS — R6521 Severe sepsis with septic shock: Secondary | ICD-10-CM | POA: Diagnosis not present

## 2024-05-10 DIAGNOSIS — M7989 Other specified soft tissue disorders: Secondary | ICD-10-CM | POA: Diagnosis not present

## 2024-05-10 DIAGNOSIS — E0580 Other thyrotoxicosis without thyrotoxic crisis or storm: Principal | ICD-10-CM

## 2024-05-10 DIAGNOSIS — L89322 Pressure ulcer of left buttock, stage 2: Secondary | ICD-10-CM | POA: Diagnosis present

## 2024-05-10 DIAGNOSIS — M6281 Muscle weakness (generalized): Secondary | ICD-10-CM | POA: Diagnosis not present

## 2024-05-10 DIAGNOSIS — J690 Pneumonitis due to inhalation of food and vomit: Secondary | ICD-10-CM | POA: Diagnosis present

## 2024-05-10 DIAGNOSIS — L89312 Pressure ulcer of right buttock, stage 2: Secondary | ICD-10-CM | POA: Diagnosis present

## 2024-05-10 DIAGNOSIS — S0003XA Contusion of scalp, initial encounter: Secondary | ICD-10-CM | POA: Diagnosis not present

## 2024-05-10 DIAGNOSIS — S199XXA Unspecified injury of neck, initial encounter: Secondary | ICD-10-CM | POA: Diagnosis not present

## 2024-05-10 DIAGNOSIS — N390 Urinary tract infection, site not specified: Secondary | ICD-10-CM | POA: Diagnosis present

## 2024-05-10 DIAGNOSIS — I5033 Acute on chronic diastolic (congestive) heart failure: Secondary | ICD-10-CM | POA: Diagnosis not present

## 2024-05-10 DIAGNOSIS — J9690 Respiratory failure, unspecified, unspecified whether with hypoxia or hypercapnia: Secondary | ICD-10-CM | POA: Diagnosis not present

## 2024-05-10 DIAGNOSIS — Z6820 Body mass index (BMI) 20.0-20.9, adult: Secondary | ICD-10-CM

## 2024-05-10 DIAGNOSIS — Z7983 Long term (current) use of bisphosphonates: Secondary | ICD-10-CM

## 2024-05-10 DIAGNOSIS — N139 Obstructive and reflux uropathy, unspecified: Secondary | ICD-10-CM | POA: Diagnosis present

## 2024-05-10 DIAGNOSIS — E11649 Type 2 diabetes mellitus with hypoglycemia without coma: Secondary | ICD-10-CM | POA: Diagnosis not present

## 2024-05-10 DIAGNOSIS — Z515 Encounter for palliative care: Secondary | ICD-10-CM

## 2024-05-10 DIAGNOSIS — J9811 Atelectasis: Secondary | ICD-10-CM | POA: Diagnosis not present

## 2024-05-10 DIAGNOSIS — C799 Secondary malignant neoplasm of unspecified site: Secondary | ICD-10-CM | POA: Diagnosis not present

## 2024-05-10 DIAGNOSIS — Z9841 Cataract extraction status, right eye: Secondary | ICD-10-CM

## 2024-05-10 DIAGNOSIS — N368 Other specified disorders of urethra: Secondary | ICD-10-CM | POA: Diagnosis not present

## 2024-05-10 DIAGNOSIS — A419 Sepsis, unspecified organism: Secondary | ICD-10-CM | POA: Diagnosis present

## 2024-05-10 DIAGNOSIS — C801 Malignant (primary) neoplasm, unspecified: Secondary | ICD-10-CM | POA: Diagnosis not present

## 2024-05-10 DIAGNOSIS — R1312 Dysphagia, oropharyngeal phase: Secondary | ICD-10-CM | POA: Diagnosis not present

## 2024-05-10 DIAGNOSIS — R451 Restlessness and agitation: Secondary | ICD-10-CM | POA: Diagnosis not present

## 2024-05-10 DIAGNOSIS — R0602 Shortness of breath: Secondary | ICD-10-CM | POA: Diagnosis not present

## 2024-05-10 DIAGNOSIS — Z79890 Hormone replacement therapy (postmenopausal): Secondary | ICD-10-CM

## 2024-05-10 DIAGNOSIS — Z7189 Other specified counseling: Secondary | ICD-10-CM | POA: Diagnosis not present

## 2024-05-10 DIAGNOSIS — Z043 Encounter for examination and observation following other accident: Secondary | ICD-10-CM | POA: Diagnosis not present

## 2024-05-10 DIAGNOSIS — I700 Atherosclerosis of aorta: Secondary | ICD-10-CM | POA: Diagnosis not present

## 2024-05-10 DIAGNOSIS — R296 Repeated falls: Secondary | ICD-10-CM | POA: Diagnosis present

## 2024-05-10 DIAGNOSIS — M1712 Unilateral primary osteoarthritis, left knee: Secondary | ICD-10-CM | POA: Diagnosis not present

## 2024-05-10 DIAGNOSIS — J40 Bronchitis, not specified as acute or chronic: Secondary | ICD-10-CM | POA: Diagnosis not present

## 2024-05-10 LAB — CBC WITH DIFFERENTIAL/PLATELET
Abs Immature Granulocytes: 0.08 K/uL — ABNORMAL HIGH (ref 0.00–0.07)
Basophils Absolute: 0 K/uL (ref 0.0–0.1)
Basophils Relative: 0 %
Eosinophils Absolute: 0.1 K/uL (ref 0.0–0.5)
Eosinophils Relative: 1 %
HCT: 44.3 % (ref 39.0–52.0)
Hemoglobin: 15.6 g/dL (ref 13.0–17.0)
Immature Granulocytes: 1 %
Lymphocytes Relative: 10 %
Lymphs Abs: 0.9 K/uL (ref 0.7–4.0)
MCH: 34 pg (ref 26.0–34.0)
MCHC: 35.2 g/dL (ref 30.0–36.0)
MCV: 96.5 fL (ref 80.0–100.0)
Monocytes Absolute: 0.8 K/uL (ref 0.1–1.0)
Monocytes Relative: 9 %
Neutro Abs: 7.9 K/uL — ABNORMAL HIGH (ref 1.7–7.7)
Neutrophils Relative %: 79 %
Platelets: 478 K/uL — ABNORMAL HIGH (ref 150–400)
RBC: 4.59 MIL/uL (ref 4.22–5.81)
RDW: 13.9 % (ref 11.5–15.5)
WBC: 9.8 K/uL (ref 4.0–10.5)
nRBC: 0 % (ref 0.0–0.2)

## 2024-05-10 LAB — COMPREHENSIVE METABOLIC PANEL WITH GFR
ALT: 19 U/L (ref 0–44)
AST: 28 U/L (ref 15–41)
Albumin: 2.9 g/dL — ABNORMAL LOW (ref 3.5–5.0)
Alkaline Phosphatase: 69 U/L (ref 38–126)
Anion gap: 13 (ref 5–15)
BUN: 25 mg/dL — ABNORMAL HIGH (ref 8–23)
CO2: 29 mmol/L (ref 22–32)
Calcium: 8.8 mg/dL — ABNORMAL LOW (ref 8.9–10.3)
Chloride: 99 mmol/L (ref 98–111)
Creatinine, Ser: 0.85 mg/dL (ref 0.61–1.24)
GFR, Estimated: >60 mL/min (ref >60)
Glucose, Bld: 190 mg/dL — ABNORMAL HIGH (ref 70–99)
Potassium: 4.3 mmol/L (ref 3.5–5.1)
Sodium: 141 mmol/L (ref 135–145)
Total Bilirubin: 1.1 mg/dL (ref 0.0–1.2)
Total Protein: 6.3 g/dL — ABNORMAL LOW (ref 6.5–8.1)

## 2024-05-10 LAB — CBC
HCT: 43.8 % (ref 39.0–52.0)
Hemoglobin: 15 g/dL (ref 13.0–17.0)
MCH: 33.5 pg (ref 26.0–34.0)
MCHC: 34.2 g/dL (ref 30.0–36.0)
MCV: 97.8 fL (ref 80.0–100.0)
Platelets: 481 K/uL — ABNORMAL HIGH (ref 150–400)
RBC: 4.48 MIL/uL (ref 4.22–5.81)
RDW: 13.9 % (ref 11.5–15.5)
WBC: 20.8 K/uL — ABNORMAL HIGH (ref 4.0–10.5)
nRBC: 0 % (ref 0.0–0.2)

## 2024-05-10 LAB — GLUCOSE, CAPILLARY
Glucose-Capillary: 111 mg/dL — ABNORMAL HIGH (ref 70–99)
Glucose-Capillary: 113 mg/dL — ABNORMAL HIGH (ref 70–99)

## 2024-05-10 LAB — CREATININE, SERUM
Creatinine, Ser: 2.22 mg/dL — ABNORMAL HIGH (ref 0.61–1.24)
GFR, Estimated: 28 mL/min — ABNORMAL LOW (ref >60)

## 2024-05-10 LAB — URINALYSIS, W/ REFLEX TO CULTURE (INFECTION SUSPECTED)
Bacteria, UA: NONE SEEN
Bilirubin Urine: NEGATIVE
Glucose, UA: 150 mg/dL — AB
Ketones, ur: NEGATIVE mg/dL
Leukocytes,Ua: NEGATIVE
Nitrite: POSITIVE — AB
Protein, ur: 100 mg/dL — AB
RBC / HPF: >50 RBC/hpf (ref 0–5)
Specific Gravity, Urine: 1.02 (ref 1.005–1.030)
WBC, UA: >50 WBC/hpf (ref 0–5)
pH: 5 (ref 5.0–8.0)

## 2024-05-10 LAB — HEMOGLOBIN A1C
Hgb A1c MFr Bld: 5.9 % — ABNORMAL HIGH (ref 4.8–5.6)
Mean Plasma Glucose: 122.63 mg/dL

## 2024-05-10 LAB — TSH: TSH: 0.198 u[IU]/mL — ABNORMAL LOW (ref 0.350–4.500)

## 2024-05-10 MED ORDER — SODIUM CHLORIDE 0.9 % IV BOLUS
1000.0000 mL | Freq: Once | INTRAVENOUS | Status: AC
Start: 2024-05-10 — End: 2024-05-11
  Administered 2024-05-10: 1000 mL via INTRAVENOUS

## 2024-05-10 MED ORDER — FUROSEMIDE 40 MG PO TABS: 20.0000 mg | ORAL_TABLET | Freq: Every day | ORAL | Status: DC

## 2024-05-10 MED ORDER — INSULIN GLARGINE 100 UNIT/ML ~~LOC~~ SOLN
59.0000 [IU] | Freq: Two times a day (BID) | SUBCUTANEOUS | Status: DC
  Filled 2024-05-10 (×3): qty 0.59

## 2024-05-10 MED ORDER — INSULIN GLARGINE 100 UNIT/ML ~~LOC~~ SOLN
25.0000 [IU] | Freq: Two times a day (BID) | SUBCUTANEOUS | Status: DC
  Administered 2024-05-11 (×2): 25 [IU] via SUBCUTANEOUS
  Filled 2024-05-10 (×5): qty 0.25

## 2024-05-10 MED ORDER — SODIUM CHLORIDE 0.9 % IV BOLUS
500.0000 mL | Freq: Once | INTRAVENOUS | Status: AC
  Administered 2024-05-10: 500 mL via INTRAVENOUS

## 2024-05-10 MED ORDER — LIDOCAINE HCL URETHRAL/MUCOSAL 2 % EX GEL
1.0000 | Freq: Once | CUTANEOUS | Status: AC
  Administered 2024-05-10: 1 via URETHRAL
  Filled 2024-05-10: qty 5

## 2024-05-10 MED ORDER — ADULT MULTIVITAMIN W/MINERALS CH
1.0000 | ORAL_TABLET | Freq: Every day | ORAL | Status: DC
  Administered 2024-05-11 – 2024-05-12 (×2): 1 via ORAL
  Filled 2024-05-10 (×3): qty 1

## 2024-05-10 MED ORDER — SODIUM CHLORIDE 0.9 % IV BOLUS
250.0000 mL | Freq: Once | INTRAVENOUS | Status: AC
  Administered 2024-05-11: 250 mL via INTRAVENOUS

## 2024-05-10 MED ORDER — CHLORHEXIDINE GLUCONATE CLOTH 2 % EX PADS
6.0000 | MEDICATED_PAD | Freq: Every day | CUTANEOUS | Status: DC
  Administered 2024-05-11 – 2024-05-15 (×4): 6 via TOPICAL

## 2024-05-10 MED ORDER — METFORMIN HCL 500 MG PO TABS: 1000.0000 mg | ORAL_TABLET | Freq: Two times a day (BID) | ORAL | Status: DC

## 2024-05-10 MED ORDER — SITAGLIPTIN PHOS-METFORMIN HCL 50-1000 MG PO TABS: 1.0000 | ORAL_TABLET | Freq: Two times a day (BID) | ORAL | Status: DC

## 2024-05-10 MED ORDER — FUROSEMIDE 20 MG PO TABS: 20.0000 mg | ORAL_TABLET | Freq: Every day | ORAL | Status: DC

## 2024-05-10 MED ORDER — ONDANSETRON HCL 4 MG/2ML IJ SOLN
INTRAMUSCULAR | Status: AC
  Filled 2024-05-10: qty 2

## 2024-05-10 MED ORDER — INSULIN ASPART 100 UNIT/ML IJ SOLN
0.0000 [IU] | Freq: Three times a day (TID) | INTRAMUSCULAR | Status: DC
  Administered 2024-05-11: 2 [IU] via SUBCUTANEOUS
  Administered 2024-05-11: 1 [IU] via SUBCUTANEOUS
  Administered 2024-05-12: 2 [IU] via SUBCUTANEOUS

## 2024-05-10 MED ORDER — METOPROLOL TARTRATE 25 MG PO TABS
12.5000 mg | ORAL_TABLET | Freq: Two times a day (BID) | ORAL | Status: DC
Start: 2024-05-10 — End: 2024-05-11

## 2024-05-10 MED ORDER — EZETIMIBE 10 MG PO TABS
10.0000 mg | ORAL_TABLET | Freq: Every day | ORAL | Status: DC
  Administered 2024-05-11 – 2024-05-14 (×4): 10 mg via ORAL
  Filled 2024-05-10 (×3): qty 1

## 2024-05-10 MED ORDER — ALBUMIN HUMAN 5 % IV SOLN
12.5000 g | Freq: Once | INTRAVENOUS | Status: AC
  Administered 2024-05-11: 12.5 g via INTRAVENOUS
  Filled 2024-05-10: qty 250

## 2024-05-10 MED ORDER — SODIUM CHLORIDE 0.9 % IV SOLN
2.0000 g | Freq: Once | INTRAVENOUS | Status: AC
  Administered 2024-05-10: 2 g via INTRAVENOUS
  Filled 2024-05-10: qty 20

## 2024-05-10 MED ORDER — TAMSULOSIN HCL 0.4 MG PO CAPS
0.4000 mg | ORAL_CAPSULE | Freq: Every day | ORAL | Status: DC
  Administered 2024-05-11 – 2024-05-14 (×4): 0.4 mg via ORAL
  Filled 2024-05-10 (×4): qty 1

## 2024-05-10 MED ORDER — METOPROLOL TARTRATE 25 MG PO TABS
50.0000 mg | ORAL_TABLET | Freq: Two times a day (BID) | ORAL | Status: DC
  Filled 2024-05-10: qty 2

## 2024-05-10 MED ORDER — LACTATED RINGERS IV SOLN: INTRAVENOUS | Status: DC

## 2024-05-10 MED ORDER — LEVOTHYROXINE SODIUM 25 MCG PO TABS
25.0000 ug | ORAL_TABLET | Freq: Every day | ORAL | Status: DC
  Administered 2024-05-11 – 2024-05-15 (×4): 25 ug via ORAL
  Filled 2024-05-10 (×4): qty 1

## 2024-05-10 MED ORDER — POLYVINYL ALCOHOL 1.4 % OP SOLN
1.0000 [drp] | Freq: Two times a day (BID) | OPHTHALMIC | Status: DC
  Administered 2024-05-11 – 2024-05-15 (×10): 1 [drp] via OPHTHALMIC
  Filled 2024-05-10 (×3): qty 15

## 2024-05-10 MED ORDER — LINAGLIPTIN 5 MG PO TABS
5.0000 mg | ORAL_TABLET | Freq: Every day | ORAL | Status: DC
Start: 2024-05-10 — End: 2024-05-10
  Filled 2024-05-10: qty 1

## 2024-05-10 MED ORDER — INSULIN ASPART 100 UNIT/ML IJ SOLN
0.0000 [IU] | Freq: Every day | INTRAMUSCULAR | Status: DC
  Administered 2024-05-11: 2 [IU] via SUBCUTANEOUS

## 2024-05-10 MED ORDER — ENOXAPARIN SODIUM 40 MG/0.4ML IJ SOSY
40.0000 mg | PREFILLED_SYRINGE | INTRAMUSCULAR | Status: DC
  Administered 2024-05-10: 40 mg via SUBCUTANEOUS
  Filled 2024-05-10: qty 0.4

## 2024-05-10 MED ORDER — ONDANSETRON HCL 4 MG/2ML IJ SOLN
4.0000 mg | Freq: Once | INTRAMUSCULAR | Status: AC
  Administered 2024-05-10: 4 mg via INTRAVENOUS
  Filled 2024-05-10: qty 2

## 2024-05-10 MED ORDER — MORPHINE SULFATE (PF) 4 MG/ML IV SOLN
4.0000 mg | Freq: Once | INTRAVENOUS | Status: AC
  Administered 2024-05-10: 4 mg via INTRAVENOUS
  Filled 2024-05-10: qty 1

## 2024-05-10 MED ORDER — ACETAMINOPHEN 650 MG RE SUPP: 650.0000 mg | Freq: Four times a day (QID) | RECTAL | Status: DC | PRN

## 2024-05-10 MED ORDER — SIMVASTATIN 20 MG PO TABS
20.0000 mg | ORAL_TABLET | Freq: Every day | ORAL | Status: DC
Start: 2024-05-10 — End: 2024-05-15
  Administered 2024-05-11 – 2024-05-14 (×4): 20 mg via ORAL
  Filled 2024-05-10 (×5): qty 1

## 2024-05-10 MED ORDER — SODIUM CHLORIDE 0.9 % IR SOLN: 3000.0000 mL | Status: DC

## 2024-05-10 MED ORDER — ONDANSETRON HCL 4 MG/2ML IJ SOLN
4.0000 mg | Freq: Four times a day (QID) | INTRAMUSCULAR | Status: DC | PRN
  Administered 2024-05-10: 4 mg via INTRAVENOUS

## 2024-05-10 MED ORDER — ACETAMINOPHEN 325 MG PO TABS: 650.0000 mg | ORAL_TABLET | Freq: Four times a day (QID) | ORAL | Status: DC | PRN

## 2024-05-10 MED ORDER — DAPAGLIFLOZIN PROPANEDIOL 10 MG PO TABS
10.0000 mg | ORAL_TABLET | Freq: Every day | ORAL | Status: DC
  Filled 2024-05-10 (×3): qty 1

## 2024-05-10 MED ORDER — LISINOPRIL 10 MG PO TABS
10.0000 mg | ORAL_TABLET | Freq: Every day | ORAL | Status: DC
Start: 2024-05-10 — End: 2024-05-10
  Filled 2024-05-10: qty 1

## 2024-05-10 MED ORDER — NATEGLINIDE 120 MG PO TABS
120.0000 mg | ORAL_TABLET | Freq: Three times a day (TID) | ORAL | Status: DC
Start: 2024-05-10 — End: 2024-05-10

## 2024-05-10 NOTE — ED Notes (Addendum)
 2 attempts at an I&O, Myself and RN Rock have tried but patient has way to many blood clots. Both attempts were unsuccessful.

## 2024-05-10 NOTE — ED Provider Notes (Signed)
 89 year old male transfer from [facility] ED.  He has had multiple frequent falls.  Today having difficulty urinating.  Imaging without any significant acute traumatic findings.  Patient was having significant hematuria and clots and unable to clear so was sent here for urology to evaluate for possible catheter and irrigation.  Ultimately there is concern that the patient will not be able to safely return home and may need SNF or placement Physical Exam  BP (!) 117/53 (BP Location: Right Arm)   Pulse (!) 106   Temp 97.8 F (36.6 C) (Oral)   Resp (!) 25   Ht 5' 11 (1.803 m)   Wt 60.8 kg   SpO2 97%   BMI 18.69 kg/m   Physical Exam  Procedures  Procedures  ED Course / MDM    Medical Decision Making Amount and/or Complexity of Data Reviewed Labs: ordered. Radiology: ordered.  Risk OTC drugs. Prescription drug management. Decision regarding hospitalization.   1540.  Patient is awake and alert.  He understands that he is down here to see urology.  Complaining of bladder discomfort.  Dr. Cam urology is here to evaluate patient  1410.  Dr. Cam was able to get a catheter in him and his irrigated him.  He did not have a lot of urine in there and not a lot of clot.  He is concerned he may have an infection.  Will try to get a clean specimen now that he is able to pass urine. Son is at bedside and said he has been having a lot of falls and more confusion.  Patient would probably benefit from admission for further management.  1745.  Discussed with Dr. Cindy [facility] hospitalist who will evaluate patient for admission    Towana Ozell BROCKS, MD 05/11/24 [phone]

## 2024-05-10 NOTE — ED Notes (Signed)
 Contacted A/C about 3 way catheter and awaiting supplies at this time.

## 2024-05-10 NOTE — ED Provider Notes (Signed)
 [place] EMERGENCY DEPARTMENT AT [facility] Provider Note   CSN: [id number] Arrival date & time: 05/10/24  9185     Patient presents with: Jeremy Ferrell is a 89 y.o. male.   Pt is a 89 yo male with pmhx significant for DM, HTN, thyroid  cancer s/p thyroidectomy and radical neck dissection.  Pt has had several falls lately.  He fell going to his mailbox last week.  He did see his doctor and was put on a 5 day course of lasix  20 mg daily.  Pt said that did help the leg swelling a little bit.  Now, he can't urinate.  He feels like he has to, but it won't come out.  Pt has not urinated since yesterday.  He fell again today.  He is not on thinners.  Pt denies any pain.  He still has the leg swelling.       Prior to Admission medications   Medication Sig Start Date End Date Taking? Authorizing Provider  B-D ULTRAFINE III SHORT PEN 31G X 8 MM MISC Inject into the skin 2 (two) times daily. 04/05/22   [provider]  Bioflavonoid Products (ESTER-C PO) Take 2 tablets by mouth daily. Immune 500 mg each    [provider]  ezetimibe  (ZETIA ) 10 MG tablet Take 10 mg by mouth at bedtime.    [provider]  FARXIGA  10 MG TABS tablet Take 10 mg by mouth daily. 04/19/22   [provider]  HYDROcodone -acetaminophen  (NORCO) 7.5-325 MG tablet Take 1 tablet by mouth every 6 (six) hours as needed for moderate pain. 07/25/22   Jesus Oliphant, MD  insulin  glargine (LANTUS  SOLOSTAR) 100 UNIT/ML Solostar Pen Inject 59 Units into the skin 2 (two) times daily. Before breakfast and at bedtime    [provider]  JANUMET  50-1000 MG tablet Take 1 tablet by mouth 2 (two) times daily.    [provider]  Anselm Oil 500 MG CAPS Take 500 mg by mouth daily. Mega Red  omega 3    [provider]  levothyroxine  (SYNTHROID ) 50 MCG tablet Take 50 mcg by mouth daily before breakfast.    [provider]  lisinopril  (ZESTRIL ) 10 MG tablet  Take 10 mg by mouth daily.    [provider]  metoprolol  tartrate (LOPRESSOR ) 50 MG tablet Take 50 mg by mouth 2 (two) times daily.    [provider]  Multiple Vitamins-Minerals (MULTIVITAMIN WITH MINERALS) tablet Take 1 tablet by mouth daily. Centrum 50+    [provider]  nateglinide  (STARLIX ) 120 MG tablet Take 120 mg by mouth 3 (three) times daily.    [provider]  ondansetron  (ZOFRAN -ODT) 4 MG disintegrating tablet Take 1 tablet (4 mg total) by mouth every 8 (eight) hours as needed for nausea or vomiting. 07/25/22   Jesus Oliphant, MD  Polyethyl Glycol-Propyl Glycol (SYSTANE ULTRA) 0.4-0.3 % SOLN Place 1 drop into both eyes 2 (two) times daily.    [provider]  simvastatin  (ZOCOR ) 20 MG tablet Take 20 mg by mouth at bedtime.    [provider]  tamsulosin  (FLOMAX ) 0.4 MG CAPS capsule Take 0.4 mg by mouth at bedtime.    [provider]  tobramycin (TOBREX) 0.3 % ophthalmic solution SMARTSIG:In Eye(s) Patient not taking: Reported on 07/09/2022    [provider]    Allergies: Penicillins    Review of Systems  Cardiovascular:  Positive for leg swelling.  All other systems reviewed  and are negative.   Updated Vital Signs BP 136/79   Pulse 92   Temp 97.7 F (36.5 C) (Oral)   Resp (!) 33   Ht 5' 11 (1.803 m)   Wt 60.8 kg   SpO2 92%   BMI 18.69 kg/m   Physical Exam Vitals and nursing note reviewed.  Constitutional:      Appearance: Normal appearance.  HENT:     Head: Normocephalic.     Comments: Bruising to forehead from fall Healing skin cancer wound to top of head    Right Ear: External ear normal.     Left Ear: External ear normal.     Nose: Nose normal.     Mouth/Throat:     Mouth: Mucous membranes are moist.     Pharynx: Oropharynx is clear.  Eyes:     Extraocular Movements: Extraocular movements intact.     Conjunctiva/sclera: Conjunctivae normal.     Pupils: Pupils are equal, round, and  reactive to light.  Cardiovascular:     Rate and Rhythm: Normal rate and regular rhythm.     Pulses: Normal pulses.     Heart sounds: Normal heart sounds.  Pulmonary:     Effort: Pulmonary effort is normal.     Breath sounds: Normal breath sounds.  Abdominal:     General: Abdomen is flat. Bowel sounds are normal.     Palpations: Abdomen is soft.     Tenderness: There is abdominal tenderness in the suprapubic area.  Musculoskeletal:        General: Normal range of motion.     Cervical back: Normal range of motion and neck supple.     Right lower leg: Edema present.     Left lower leg: Edema present.  Skin:    General: Skin is warm.     Capillary Refill: Capillary refill takes less than 2 seconds.  Neurological:     General: No focal deficit present.     Mental Status: He is alert and oriented to person, place, and time.  Psychiatric:        Mood and Affect: Mood normal.        Behavior: Behavior normal.     (all labs ordered are listed, but only abnormal results are displayed) Labs Reviewed  COMPREHENSIVE METABOLIC PANEL WITH GFR - Abnormal; Notable for the following components:      Result Value   Glucose, Bld 190 (*)    BUN 25 (*)    Calcium  8.8 (*)    Total Protein 6.3 (*)    Albumin  2.9 (*)    All other components within normal limits  CBC WITH DIFFERENTIAL/PLATELET - Abnormal; Notable for the following components:   Platelets 478 (*)    Neutro Abs 7.9 (*)    Abs Immature Granulocytes 0.08 (*)    All other components within normal limits  TSH - Abnormal; Notable for the following components:   TSH 0.198 (*)    All other components within normal limits  URINALYSIS, ROUTINE W REFLEX MICROSCOPIC    EKG: None  Radiology: DG Tibia/Fibula Left Result Date: 05/10/2024 CLINICAL DATA:  Patient fell this morning.  Pain. EXAM: LEFT TIBIA AND FIBULA - 2 VIEW COMPARISON:  None Available. FINDINGS: No evidence for an acute fracture in the tibia or fibula. No worrisome  lytic or sclerotic osseous abnormality. Degenerative changes are noted at the knee with meniscal calcification evident. IMPRESSION: 1. No acute bony findings. 2. Degenerative changes at the knee. Electronically Signed  By: Camellia Candle M.D.   On: 05/10/2024 11:50   CT Head Wo Contrast Result Date: 05/10/2024 CLINICAL DATA:  Polytrauma, blunt.  Fall. EXAM: CT HEAD WITHOUT CONTRAST CT CERVICAL SPINE WITHOUT CONTRAST TECHNIQUE: Multidetector CT imaging of the head and cervical spine was performed following the standard protocol without intravenous contrast. Multiplanar CT image reconstructions of the cervical spine were also generated. RADIATION DOSE REDUCTION: This exam was performed according to the departmental dose-optimization program which includes automated exposure control, adjustment of the mA and/or kV according to patient size and/or use of iterative reconstruction technique. COMPARISON:  MRI head 05/06/2023 FINDINGS: CT HEAD FINDINGS Brain: There is no evidence of an acute infarct, intracranial hemorrhage, mass, midline shift, or extra-axial fluid collection. There is mild cerebral atrophy. Cerebral white matter hypodensities are nonspecific but compatible with mild chronic small vessel ischemic disease. Vascular: Calcified atherosclerosis at the skull base. No hyperdense vessel. Skull: No acute fracture or suspicious lesion. Sinuses/Orbits: Visualized paranasal sinuses and mastoid air cells are clear. Bilateral cataract extraction. Other: Left frontal scalp contusion. CT CERVICAL SPINE FINDINGS Alignment: Trace retrolisthesis of C4 on C5.  No acute fracture Skull base and vertebrae: No acute fracture or suspicious lesion. Mild atlantodental degenerative changes. Soft tissues and spinal canal: No prevertebral fluid or swelling. No visible canal hematoma. Disc levels: Multilevel disc degeneration, greatest at C4-5 where a broad-based posterior disc osteophyte complex result in likely mild spinal  stenosis and severe right neural foraminal stenosis. Upper chest: Partially visualized moderately large right and small left pleural effusions. Other: None. IMPRESSION: 1. No evidence of acute intracranial abnormality or cervical spine fracture. 2. Left frontal scalp contusion. 3. Mild chronic small vessel ischemic disease. 4. Right larger than left pleural effusions. Electronically Signed   By: Dasie Hamburg M.D.   On: 05/10/2024 11:00   CT Cervical Spine Wo Contrast Result Date: 05/10/2024 CLINICAL DATA:  Polytrauma, blunt.  Fall. EXAM: CT HEAD WITHOUT CONTRAST CT CERVICAL SPINE WITHOUT CONTRAST TECHNIQUE: Multidetector CT imaging of the head and cervical spine was performed following the standard protocol without intravenous contrast. Multiplanar CT image reconstructions of the cervical spine were also generated. RADIATION DOSE REDUCTION: This exam was performed according to the departmental dose-optimization program which includes automated exposure control, adjustment of the mA and/or kV according to patient size and/or use of iterative reconstruction technique. COMPARISON:  MRI head 05/06/2023 FINDINGS: CT HEAD FINDINGS Brain: There is no evidence of an acute infarct, intracranial hemorrhage, mass, midline shift, or extra-axial fluid collection. There is mild cerebral atrophy. Cerebral white matter hypodensities are nonspecific but compatible with mild chronic small vessel ischemic disease. Vascular: Calcified atherosclerosis at the skull base. No hyperdense vessel. Skull: No acute fracture or suspicious lesion. Sinuses/Orbits: Visualized paranasal sinuses and mastoid air cells are clear. Bilateral cataract extraction. Other: Left frontal scalp contusion. CT CERVICAL SPINE FINDINGS Alignment: Trace retrolisthesis of C4 on C5.  No acute fracture Skull base and vertebrae: No acute fracture or suspicious lesion. Mild atlantodental degenerative changes. Soft tissues and spinal canal: No prevertebral fluid or  swelling. No visible canal hematoma. Disc levels: Multilevel disc degeneration, greatest at C4-5 where a broad-based posterior disc osteophyte complex result in likely mild spinal stenosis and severe right neural foraminal stenosis. Upper chest: Partially visualized moderately large right and small left pleural effusions. Other: None. IMPRESSION: 1. No evidence of acute intracranial abnormality or cervical spine fracture. 2. Left frontal scalp contusion. 3. Mild chronic small vessel ischemic disease. 4. Right larger than left pleural  effusions. Electronically Signed   By: Dasie Hamburg M.D.   On: 05/10/2024 11:00   DG Chest Portable 1 View Result Date: 05/10/2024 CLINICAL DATA:  Status post fall from standing. EXAM: PORTABLE CHEST 1 VIEW COMPARISON:  11/17/2013 FINDINGS: Low lung volumes. The cardio pericardial silhouette is enlarged. Vascular congestion diffuse interstitial opacity suggests edema. Bibasilar atelectasis or infiltrate noted with small bilateral pleural effusions. Stable focal pleural thickening lateral left mid chest. Bones are diffusely demineralized. Telemetry leads overlie the chest. IMPRESSION: 1. Low volume film with vascular congestion and diffuse interstitial opacity suggesting edema. 2. Bibasilar atelectasis or infiltrate with small bilateral pleural effusions. Electronically Signed   By: Camellia Candle M.D.   On: 05/10/2024 09:40     Procedures   Medications Ordered in the ED  Chlorhexidine  Gluconate Cloth 2 % PADS 6 each (has no administration in time range)  sodium chloride  irrigation 0.9 % 3,000 mL (has no administration in time range)  ezetimibe  (ZETIA ) tablet 10 mg (has no administration in time range)  dapagliflozin  propanediol (FARXIGA ) tablet 10 mg (has no administration in time range)  insulin  glargine (LANTUS ) injection 59 Units (has no administration in time range)  sitaGLIPtin -metformin  (JANUMET ) 50-1000 MG per tablet 1 tablet (has no administration in time range)   lisinopril  (ZESTRIL ) tablet 10 mg (has no administration in time range)  metoprolol  tartrate (LOPRESSOR ) tablet 50 mg (has no administration in time range)  multivitamin with minerals tablet 1 tablet (has no administration in time range)  nateglinide  (STARLIX ) tablet 120 mg (has no administration in time range)  Polyethyl Glycol-Propyl Glycol 0.4-0.3 % SOLN 1 drop (has no administration in time range)  simvastatin  (ZOCOR ) tablet 20 mg (has no administration in time range)  tamsulosin  (FLOMAX ) capsule 0.4 mg (has no administration in time range)  levothyroxine  (SYNTHROID ) tablet 25 mcg (has no administration in time range)  furosemide  (LASIX ) tablet 20 mg (has no administration in time range)  lidocaine  (XYLOCAINE ) 2 % jelly 1 Application (1 Application Urethral Given 05/10/24 1147)  morphine  (PF) 4 MG/ML injection 4 mg (4 mg Intravenous Given 05/10/24 1249)  ondansetron  (ZOFRAN ) injection 4 mg (4 mg Intravenous Given 05/10/24 1249)                                    Medical Decision Making Amount and/or Complexity of Data Reviewed Labs: ordered. Radiology: ordered.  Risk OTC drugs. Prescription drug management.   This patient presents to the ED for concern of multiple falls and difficulty urinating, this involves an extensive number of treatment options, and is a complaint that carries with it a high risk of complications and morbidity.  The differential diagnosis includes multiple trauma   Co morbidities that complicate the patient evaluation  DM, HTN, thyroid  cancer s/p thyroidectomy and radical neck dissection   Additional history obtained:  Additional history obtained from epic chart review External records from outside source obtained and reviewed including son   Lab Tests:  I Ordered, and personally interpreted labs.  The pertinent results include:  cbc nl, cmp nl other than bs elevated at 190; bun sl elevated at 25, cr nl; TSH low at 0.198   Imaging Studies  ordered:  I ordered imaging studies including ct head/c-spine, cxr, tib/fib  I independently visualized and interpreted imaging which showed  CT head/c-spine No evidence of acute intracranial abnormality or cervical spine  fracture.  2. Left frontal scalp contusion.  3.  Mild chronic small vessel ischemic disease.  4. Right larger than left pleural effusions.  CXR: Low volume film with vascular congestion and diffuse interstitial  opacity suggesting edema.  2. Bibasilar atelectasis or infiltrate with small bilateral pleural  effusions.  L tib/fib:  No acute bony findings.  2. Degenerative changes at the knee.   I agree with the radiologist interpretation   Cardiac Monitoring:  The patient was maintained on a cardiac monitor.  I personally viewed and interpreted the cardiac monitored which showed an underlying rhythm of: nsr   Medicines ordered and prescription drug management:   I have reviewed the patients home medicines and have made adjustments as needed   Test Considered:  ct   Critical Interventions:  Urology    Consultations Obtained:  I requested consultation with the urologist (Dr. Cam),  and discussed lab and imaging findings as well as pertinent plan -he recommends transfer to [facility].  Please get the urology cart and call him when pt arrives. Pt d/w Dr. Garrick (ED) who accepted pt for transfer.   Problem List / ED Course:  Multiple falls:  pt's son is very worried about him going home.  He requests SW for placement.  TOC consult and PT consult placed. Urinary retention:  nursing attempted several times to pass a catheter.  Unfortunately, they were unsuccessful.  Dr. Cam will place a catheter at [facility]. Secondary hyperthyroidism:  I am going to decrease his synthroid  for a few days.  TSH will need to be rechecked in about a week. Peripheral edema and small pleural effusions.  Pt placed on compression hose and lasix  is  continued.   Reevaluation:  After the interventions noted above, I reevaluated the patient and found that they have :improved   Social Determinants of Health:  Lives alone   Dispostion:  After consideration of the diagnostic results and the patients response to treatment, I feel that the patent would benefit from tx to [facility].  TOC for placement.       Final diagnoses:  Secondary hyperthyroidism  Urinary retention  Ambulatory dysfunction  Pleural effusion  Peripheral edema    ED Discharge Orders     None          Dean Clarity, MD 05/10/24 1402

## 2024-05-10 NOTE — Significant Event (Signed)
 Rapid Response Event Note   Reason for Call :  Second set of eyes for pt who is hypotensive and tachypneic  Initial Focused Assessment:  Pt lying in bed with eyes closed. He will awaken to voice and answer some questions and follow commands. Lungs diminished t/o. Skin warm to touch/dry. Pt has foley with hematuria.   T-98.3. HR-104, BP-90/45, RR-32, SpO2-97% on 4L [place].  Interventions:  500cc NS bolus given PTA RRT Albumin  12.5g IV Tx to PCU Plan of Care:  Pt to tx to 6E for closer monitoring. Continue to monitor pt closely. Please call RRT if further assistance needed.   Event Summary:   MD Notified: Blondie [facility] NP Call [phone] Arrival [phone] End Time:0000  Tish Graeme Piety, RN

## 2024-05-10 NOTE — ED Triage Notes (Signed)
 PT B[facility] for recent fall and new urinary retention this AM. PT endorses pain in L leg pain and lower abdomen. Aox4. VSS

## 2024-05-10 NOTE — ED Triage Notes (Addendum)
 Patient came in POV with complaints of a fall this morning while trying to get on the [facility]. Patient states he got weak and fell to the ground on his side. Not on blood thinners. Pt also states that he has not been able to urinate this morning.

## 2024-05-10 NOTE — H&P (Signed)
 History and Physical    Patient: Jeremy Ferrell FMW:[id number] DOB: [birth year] DOA: 05/10/2024 DOS: the patient was seen and examined on 05/10/2024 PCP: Marvine Rush, MD  Patient coming from: Home  Chief Complaint:  Chief Complaint  Patient presents with   Fall   HPI: Jeremy Ferrell is a 89 y.o. male with medical history significant of HTN, DM who presented to the emergency department with increased weakness and falls as well as worsening confusion over the past week prior to admission.  Patient was transferred from [facility] to [facility] with concerns of bladder outlet obstruction.  Difficult Foley cath placement was noted, ultimately placed by urology in the emergency department.  During evaluation, patient was noted to have fallen multiple times at home resulting in closed head injury.  Multiple bruising noted throughout.  Family also reports increased confusion and weakness in setting of very poor p.o. intake 1 week prior to this admission.  Of note, decreased urine was noted following Foley cath placement.  Urine also was noted to be concentrated in appearance.  Per ED physician, mucous membranes did appear dry.  Hospital service consulted for consideration for medical admission  Review of Systems: As mentioned in the history of present illness. All other systems reviewed and are negative. Past Medical History:  Diagnosis Date   Cancer (HCC) 05/2022   Thyroid  Cancer   Diabetes (HCC)    Lantus  98 units BID   Hypertension    Past Surgical History:  Procedure Laterality Date   CATARACT EXTRACTION, BILATERAL     CHOLECYSTECTOMY     EYE SURGERY Right    Macular degenaration and hemmoraghe   RADICAL NECK DISSECTION Right 07/23/2022   Procedure: RIGHT PARTIAL NECK DISSECTION;  Surgeon: Jesus Oliphant, MD;  Location: [facility] OR;  Service: ENT;  Laterality: Right;   THYROID  CYST EXCISION     THYROIDECTOMY Bilateral 07/23/2022   Procedure: TOTAL THYROIDECTOMY;   Surgeon: Jesus Oliphant, MD;  Location: [facility] OR;  Service: ENT;  Laterality: Bilateral;   Social History:  reports that he has quit smoking. His smoking use included cigarettes. He has never used smokeless tobacco. He reports that he does not currently use alcohol . He reports that he does not use drugs.  Allergies  Allergen Reactions   Penicillins Hives    History reviewed. No pertinent family history.  Prior to Admission medications   Medication Sig Start Date End Date Taking? Authorizing Provider  B-D ULTRAFINE III SHORT PEN 31G X 8 MM MISC Inject into the skin 2 (two) times daily. 04/05/22   [provider]  Bioflavonoid Products (ESTER-C PO) Take 2 tablets by mouth daily. Immune 500 mg each    [provider]  ezetimibe  (ZETIA ) 10 MG tablet Take 10 mg by mouth at bedtime.    [provider]  FARXIGA  10 MG TABS tablet Take 10 mg by mouth daily. 04/19/22   [provider]  HYDROcodone -acetaminophen  (NORCO) 7.5-325 MG tablet Take 1 tablet by mouth every 6 (six) hours as needed for moderate pain. 07/25/22   Jesus Oliphant, MD  insulin  glargine (LANTUS  SOLOSTAR) 100 UNIT/ML Solostar Pen Inject 59 Units into the skin 2 (two) times daily. Before breakfast and at bedtime    [provider]  JANUMET  50-1000 MG tablet Take 1 tablet by mouth 2 (two) times daily.    [provider]  Anselm Oil 500 MG CAPS Take 500 mg by mouth daily. Mega Red  omega 3    [provider]  levothyroxine  (SYNTHROID ) 50 MCG tablet Take 50 mcg by mouth daily before breakfast.    [provider]  lisinopril  (ZESTRIL ) 10 MG tablet Take 10 mg by mouth daily.    [provider]  metoprolol  tartrate (LOPRESSOR ) 50 MG tablet Take 50 mg by mouth 2 (two) times daily.    [provider]  Multiple Vitamins-Minerals (MULTIVITAMIN WITH MINERALS) tablet Take 1 tablet by mouth daily. Centrum 50+    [provider]  nateglinide  (STARLIX ) 120 MG  tablet Take 120 mg by mouth 3 (three) times daily.    [provider]  ondansetron  (ZOFRAN -ODT) 4 MG disintegrating tablet Take 1 tablet (4 mg total) by mouth every 8 (eight) hours as needed for nausea or vomiting. 07/25/22   Jesus Oliphant, MD  Polyethyl Glycol-Propyl Glycol (SYSTANE ULTRA) 0.4-0.3 % SOLN Place 1 drop into both eyes 2 (two) times daily.    [provider]  simvastatin  (ZOCOR ) 20 MG tablet Take 20 mg by mouth at bedtime.    [provider]  tamsulosin  (FLOMAX ) 0.4 MG CAPS capsule Take 0.4 mg by mouth at bedtime.    [provider]  tobramycin (TOBREX) 0.3 % ophthalmic solution SMARTSIG:In Eye(s) Patient not taking: Reported on 07/09/2022    [provider]    Physical Exam: Vitals:   05/10/24 0830 05/10/24 1410 05/10/24 1505 05/10/24 1813  BP: 136/79 134/76 (!) 117/53   Pulse: 92 88 (!) 106   Resp: (!) 33 (!) 24 (!) 25   Temp:  97.9 F (36.6 C) 97.8 F (36.6 C) 98 F (36.7 C)  TempSrc:  Oral Oral Oral  SpO2: 92% 94% 97%   Weight:      Height:       General exam: Awake, laying in bed, in nad Respiratory system: Normal respiratory effort, no wheezing Cardiovascular system: regular rate, s1, s2 Gastrointestinal system: Soft, nondistended, positive BS Central nervous system: CN2-12 grossly intact, strength intact Extremities: Perfused, no clubbing Skin: Mucous membranes dry, poor skin turgor Psychiatry: Mood normal //affect normal  Data Reviewed:  Labs reviewed: Na 141, K 4.3, Cr 0.85, WBC 9.8, Hgb 15.6, Plts 478  Assessment and Plan: Dehydration -Likely in setting of poor p.o. intake as observed by family member -Mucous membranes are very dry, skin turgor poor -Orthostatic vital signs reviewed, patient is markedly orthostatic with systolic blood pressure 111 down to 82 on sitting -Would continue patient with basal IV fluids, encourage p.o. intake  History of hypertension -Blood pressure stable at  present  Diabetes -Continue sliding scale insulin  as needed -Hold home metformin   Underweight -Will consult dietitian  Bladder outlet obstruction -Patient is now status post Foley cath placement in emergency department -Discussed case with urologist.  Recommendation for reevaluation and voiding trial in 6 to 7 days.  If patient remains in hospital at that time, would formally consult Urology while inpatient, otherwise would have patient follow-up as an outpatient if discharged by then  Severe protein calorie malnutrition - Dietitian consulted   Advance Care Planning:   Code Status: Full Code Full  Consults:   Family Communication: Pt in room, son at bedside  Severity of Illness: The appropriate patient status for this patient is INPATIENT. Inpatient status is judged to be reasonable and necessary in order to provide the required intensity of service to ensure the patient's safety. The patient's presenting symptoms, physical exam findings, and initial radiographic and laboratory data in the context of their chronic comorbidities is felt to place them  at high risk for further clinical deterioration. Furthermore, it is not anticipated that the patient will be medically stable for discharge from the hospital within 2 midnights of admission.   * I certify that at the point of admission it is my clinical judgment that the patient will require inpatient hospital care spanning beyond 2 midnights from the point of admission due to high intensity of service, high risk for further deterioration and high frequency of surveillance required.*  Author: Garnette Pelt, MD 05/10/2024 6:23 PM  For on call review [email or web address].

## 2024-05-10 NOTE — ED Notes (Signed)
 Patient transported to CT

## 2024-05-10 NOTE — Consult Note (Signed)
 I have been asked to see the patient by Dr. Ozell Arts, for evaluation and management of urinary retention.  History of present illness: 45M with no urologic history presented to the ED with inability to void x 12 hours.  He was noted to have approximately 500cc in his bladder at the time of his presentation.  Two separate ER staff members were unsuccessful in placing a catheter stating that each time the ran into clot.  The patient was subsequently transferred to [facility] for further management.  The patient states that he has no trouble voiding.  Never seen a urologist.  Does take tamsulosin . Recently the patient has had difficulty ambulating, has fallen twice in the last week.  The patient also states that he has lost a lot of weight recently.  Review of systems: A 12 point comprehensive review of systems was obtained and is negative unless otherwise stated in the history of present illness.  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   Current Outpatient Medications on File Prior to Encounter  Medication Sig Dispense Refill   B-D ULTRAFINE III SHORT PEN 31G X 8 MM MISC Inject into the skin 2 (two) times daily.     Bioflavonoid Products (ESTER-C PO) Take 2 tablets by mouth daily. Immune 500 mg each     ezetimibe  (ZETIA ) 10 MG tablet Take 10 mg by mouth at bedtime.     FARXIGA  10 MG TABS tablet Take 10 mg by mouth daily.     HYDROcodone -acetaminophen  (NORCO) 7.5-325 MG tablet Take 1 tablet by mouth every 6 (six) hours as needed for moderate pain. 20 tablet 0   insulin  glargine (LANTUS  SOLOSTAR) 100 UNIT/ML Solostar Pen Inject 59 Units into the skin 2 (two) times daily. Before breakfast and at bedtime     JANUMET  50-1000 MG tablet Take 1 tablet by mouth 2 (two) times daily.     Krill Oil 500 MG CAPS Take  500 mg by mouth daily. Mega Red  omega 3     levothyroxine  (SYNTHROID ) 50 MCG tablet Take 50 mcg by mouth daily before breakfast.     lisinopril  (ZESTRIL ) 10 MG tablet Take 10 mg by mouth daily.     metoprolol  tartrate (LOPRESSOR ) 50 MG tablet Take 50 mg by mouth 2 (two) times daily.     Multiple Vitamins-Minerals (MULTIVITAMIN WITH MINERALS) tablet Take 1 tablet by mouth daily. Centrum 50+     nateglinide  (STARLIX ) 120 MG tablet Take 120 mg by mouth 3 (three) times daily.     ondansetron  (ZOFRAN -ODT) 4 MG disintegrating tablet Take 1 tablet (4 mg total) by mouth every 8 (eight) hours as needed for nausea or vomiting. 20 tablet 0   Polyethyl Glycol-Propyl Glycol (SYSTANE ULTRA) 0.4-0.3 % SOLN Place 1 drop into both eyes 2 (two) times daily.     simvastatin  (ZOCOR ) 20 MG tablet Take 20 mg by mouth at bedtime.     tamsulosin  (FLOMAX ) 0.4 MG CAPS capsule Take 0.4 mg by mouth at bedtime.     tobramycin (TOBREX) 0.3 % ophthalmic solution SMARTSIG:In Eye(s) (Patient not taking: Reported on 07/09/2022)      Past Medical History:  Diagnosis Date   Cancer (HCC) 05/2022   Thyroid  Cancer   Diabetes (HCC)    Lantus  98 units BID   Hypertension     Past  Surgical History:  Procedure Laterality Date   CATARACT EXTRACTION, BILATERAL     CHOLECYSTECTOMY     EYE SURGERY Right    Macular degenaration and hemmoraghe   RADICAL NECK DISSECTION Right 07/23/2022   Procedure: RIGHT PARTIAL NECK DISSECTION;  Surgeon: Jesus Oliphant, MD;  Location: [facility] OR;  Service: ENT;  Laterality: Right;   THYROID  CYST EXCISION     THYROIDECTOMY Bilateral 07/23/2022   Procedure: TOTAL THYROIDECTOMY;  Surgeon: Jesus Oliphant, MD;  Location: [facility] OR;  Service: ENT;  Laterality: Bilateral;    Social History   Tobacco Use   Smoking status: Former    Types: Cigarettes   Smokeless tobacco: Never   Tobacco comments:    Quit 30 years ago  Vaping Use   Vaping status: Never Used  Substance Use Topics   Alcohol  use: Not Currently     Comment: over 30 years ago   Drug use: Never    History reviewed. No pertinent family history.  PE: Vitals:   05/10/24 0829 05/10/24 0830 05/10/24 1410 05/10/24 1505  BP: 126/76 136/79 134/76 (!) 117/53  Pulse: 92 92 88 (!) 106  Resp: (!) 26 (!) 33 (!) 24 (!) 25  Temp: 97.7 F (36.5 C)  97.9 F (36.6 C) 97.8 F (36.6 C)  TempSrc: Oral  Oral Oral  SpO2: 92% 92% 94% 97%  Weight:      Height:       Patient appears to be in no acute distress  patient is alert and oriented x3 Atraumatic normocephalic head No cervical or supraclavicular lymphadenopathy appreciated No increased work of breathing, no audible wheezes/rhonchi Regular sinus rhythm/rate Abdomen is soft, protuberant lower abdomen, suprapubic region is tender to palpation Uncircumcised male phallus Lower extremities are symmetric without appreciable edema Grossly neurologically intact No identifiable skin lesions  Recent Labs    05/10/24 0855  WBC 9.8  HGB 15.6  HCT 44.3   Recent Labs    05/10/24 0855  NA 141  K 4.3  CL 99  CO2 29  GLUCOSE 190*  BUN 25*  CREATININE 0.85  CALCIUM  8.8*   No results for input(s): LABPT, INR in the last 72 hours. No results for input(s): LABURIN in the last 72 hours. No results found for this or any previous visit.  Procedure: Cystoscopy (52000), difficult Foley placement Indications: Urinary retention Findings: The patient had a false passage at the bulbous/membranous urethral junction posteriorly.  He also had a very obstructing prostate.  Description: I attempted to pass a 80 French coud tipped catheter in the routine sterile fashion unsuccessfully.  I subsequently obtained informed consent from the patient and using a 16 French flexible cystoscope gently navigated through the patient's urethra and into the bladder under visual guidance.  I then advanced a wire through the cystoscope and removed the scope over the wire.  I then advanced a 20 [place] council tip  catheter over the wire and into the patient's bladder.  I subsequently removed the wire.  I inflated 10 cc of water  into the balloon.  Secured the Foley catheter to the patient's leg.   There was approximately 350 mL of blood-tinged urine.  There were several clots.  I hand irrigated these.  Impression: Urinary retention from an obstructing prostate and resultant false passage from the misguided attempt Foley catheter placement.  Recommendations: The patient had a false passage and had about 350 mL of urine in his bladder.  I recommend that he keep the Foley catheter for about a week  to let the urethra heal.  This can be followed by a voiding trial.  If the patient needs to be set up with a urologist for the voiding trial, please have the patient contact the [facility] urology clinic.   Thank you for involving me in this patient's care, we will continue to follow along. Morene LELON Salines

## 2024-05-10 NOTE — ED Notes (Signed)
 Pt attempted to use the urinal but was unable to.

## 2024-05-10 NOTE — ED Notes (Signed)
 Called [facility] and was on hold, then transferred to triage and then left vm to return call for report.

## 2024-05-11 ENCOUNTER — Inpatient Hospital Stay (HOSPITAL_COMMUNITY)

## 2024-05-11 DIAGNOSIS — E0580 Other thyrotoxicosis without thyrotoxic crisis or storm: Secondary | ICD-10-CM | POA: Diagnosis not present

## 2024-05-11 DIAGNOSIS — R262 Difficulty in walking, not elsewhere classified: Secondary | ICD-10-CM | POA: Diagnosis not present

## 2024-05-11 DIAGNOSIS — E43 Unspecified severe protein-calorie malnutrition: Secondary | ICD-10-CM | POA: Insufficient documentation

## 2024-05-11 DIAGNOSIS — R600 Localized edema: Secondary | ICD-10-CM | POA: Diagnosis not present

## 2024-05-11 DIAGNOSIS — E860 Dehydration: Secondary | ICD-10-CM | POA: Diagnosis not present

## 2024-05-11 LAB — CBC
HCT: 39 % (ref 39.0–52.0)
Hemoglobin: 13 g/dL (ref 13.0–17.0)
MCH: 33.4 pg (ref 26.0–34.0)
MCHC: 33.3 g/dL (ref 30.0–36.0)
MCV: 100.3 fL — ABNORMAL HIGH (ref 80.0–100.0)
Platelets: 415 K/uL — ABNORMAL HIGH (ref 150–400)
RBC: 3.89 MIL/uL — ABNORMAL LOW (ref 4.22–5.81)
RDW: 14.2 % (ref 11.5–15.5)
WBC: 20.2 K/uL — ABNORMAL HIGH (ref 4.0–10.5)
nRBC: 0 % (ref 0.0–0.2)

## 2024-05-11 LAB — COMPREHENSIVE METABOLIC PANEL WITH GFR
ALT: 12 U/L (ref 0–44)
AST: 17 U/L (ref 15–41)
Albumin: 2 g/dL — ABNORMAL LOW (ref 3.5–5.0)
Alkaline Phosphatase: 54 U/L (ref 38–126)
Anion gap: 11 (ref 5–15)
BUN: 36 mg/dL — ABNORMAL HIGH (ref 8–23)
CO2: 24 mmol/L (ref 22–32)
Calcium: 7.5 mg/dL — ABNORMAL LOW (ref 8.9–10.3)
Chloride: 106 mmol/L (ref 98–111)
Creatinine, Ser: 1.85 mg/dL — ABNORMAL HIGH (ref 0.61–1.24)
GFR, Estimated: 35 mL/min — ABNORMAL LOW (ref >60)
Glucose, Bld: 128 mg/dL — ABNORMAL HIGH (ref 70–99)
Potassium: 3.6 mmol/L (ref 3.5–5.1)
Sodium: 141 mmol/L (ref 135–145)
Total Bilirubin: 1 mg/dL (ref 0.0–1.2)
Total Protein: 4.6 g/dL — ABNORMAL LOW (ref 6.5–8.1)

## 2024-05-11 LAB — GLUCOSE, CAPILLARY
Glucose-Capillary: 118 mg/dL — ABNORMAL HIGH (ref 70–99)
Glucose-Capillary: 130 mg/dL — ABNORMAL HIGH (ref 70–99)
Glucose-Capillary: 105 mg/dL — ABNORMAL HIGH (ref 70–99)
Glucose-Capillary: 182 mg/dL — ABNORMAL HIGH (ref 70–99)
Glucose-Capillary: 203 mg/dL — ABNORMAL HIGH (ref 70–99)

## 2024-05-11 MED ORDER — SODIUM CHLORIDE 0.9 % IV SOLN
2.0000 g | INTRAVENOUS | Status: DC
  Administered 2024-05-11: 2 g via INTRAVENOUS
  Filled 2024-05-11: qty 20

## 2024-05-11 MED ORDER — ENOXAPARIN SODIUM 30 MG/0.3ML IJ SOSY
30.0000 mg | PREFILLED_SYRINGE | INTRAMUSCULAR | Status: DC
  Administered 2024-05-11: 30 mg via SUBCUTANEOUS
  Filled 2024-05-11: qty 0.3

## 2024-05-11 MED ORDER — ENSURE PLUS HIGH PROTEIN PO LIQD
237.0000 mL | Freq: Two times a day (BID) | ORAL | Status: DC
  Administered 2024-05-11 – 2024-05-12 (×2): 237 mL via ORAL

## 2024-05-11 MED ORDER — JUVEN PO PACK
1.0000 | PACK | Freq: Two times a day (BID) | ORAL | Status: DC
  Administered 2024-05-12: 1 via ORAL
  Filled 2024-05-11 (×3): qty 1

## 2024-05-11 MED ORDER — MIDODRINE HCL 5 MG PO TABS
10.0000 mg | ORAL_TABLET | Freq: Once | ORAL | Status: AC
  Administered 2024-05-11: 10 mg via ORAL
  Filled 2024-05-11: qty 2

## 2024-05-11 MED ORDER — GUAIFENESIN-DM 100-10 MG/5ML PO SYRP
5.0000 mL | ORAL_SOLUTION | ORAL | Status: DC | PRN
  Administered 2024-05-11 – 2024-05-12 (×3): 5 mL via ORAL
  Filled 2024-05-11 (×3): qty 5

## 2024-05-11 NOTE — Hospital Course (Signed)
 89 y.o. male with medical history significant of HTN, DM who presented to the emergency department with increased weakness and falls as well as worsening confusion over the past week prior to admission.  Patient was transferred from [facility] to [facility] with concerns of bladder outlet obstruction.  Difficult Foley cath placement was noted, ultimately placed by urology in the emergency department.  During evaluation, patient was noted to have fallen multiple times at home resulting in closed head injury.  Multiple bruising noted throughout.  Family also reports increased confusion and weakness in setting of very poor p.o. intake 1 week prior to this admission.  Of note, decreased urine was noted following Foley cath placement.  Urine also was noted to be concentrated in appearance.  Per ED physician, mucous membranes did appear dry.  Hospital service consulted for consideration for medical admission

## 2024-05-11 NOTE — TOC Initial Note (Addendum)
 Transition of Care [facility]) - Initial/Assessment Note    Patient Details  Name: Jeremy Ferrell MRN: [id number] Date of Birth: August 07, 1935  [facility]) CM/SW Contact:    Isaiah Public, LCSWA Phone Number: 05/11/2024, 2:14 PM  Clinical Narrative:                  CSW received consult for possible SNF placement at time of discharge. CSW spoke with patient at bedside regarding PT recommendation of SNF placement at time of discharge. Patient reports PTA he comes from home alone. Patient expressed understanding of PT recommendation and is agreeable to SNF placement at time of discharge. Patient gave CSW permission to fax out initial referral for SNF placement.CSW discussed insurance authorization process and will provide Medicare SNF ratings list with accepted SNF bed offers when available. Patient gave CSW permission to update his son Oneil on his dc plan. CSW updated patients son Oneil. No further questions reported at this time. CSW to continue to follow and assist with discharge planning needs.   Expected Discharge Plan: Skilled Nursing Facility Barriers to Discharge: Continued Medical Work up   Patient Goals and CMS Choice Patient states their goals for this hospitalization and ongoing recovery are:: SNF CMS Medicare.gov Compare Post Acute Care list provided to:: Patient Choice offered to / list presented to : Patient      Expected Discharge Plan and Services In-house Referral: Clinical Social Work     Living arrangements for the past 2 months: Single Family Home                                      Prior Living Arrangements/Services Living arrangements for the past 2 months: Single Family Home Lives with:: Self Patient language and need for interpreter reviewed:: Yes Do you feel safe going back to the place where you live?: No   SNF  Need for Family Participation in Patient Care: Yes (Comment) Care giver support system in place?: Yes (comment)   Criminal  Activity/Legal Involvement Pertinent to Current Situation/Hospitalization: No - Comment as needed  Activities of Daily Living   ADL Screening (condition at time of admission) Independently performs ADLs?: Yes (appropriate for developmental age) Is the patient deaf or have difficulty hearing?: No Does the patient have difficulty seeing, even when wearing glasses/contacts?: No Does the patient have difficulty concentrating, remembering, or making decisions?: No  Permission Sought/Granted Permission sought to share information with : Case Manager, Magazine features editor, Family Supports Permission granted to share information with : Yes, Verbal Permission Granted  Share Information with NAME: Oneil  Permission granted to share info w AGENCY: SNF  Permission granted to share info w Relationship: son  Permission granted to share info w Contact Information: Oneil [phone]  Emotional Assessment Appearance:: Appears stated age Attitude/Demeanor/Rapport: Gracious Affect (typically observed): Calm Orientation: : Oriented to Self, Oriented to Place, Oriented to  Time, Oriented to Situation Alcohol  / Substance Use: Not Applicable Psych Involvement: No (comment)  Admission diagnosis:  Dehydration [E86.0] Urinary retention [R33.9] Peripheral edema [R60.0] Pleural effusion [J90] Secondary hyperthyroidism [E05.80] Ambulatory dysfunction [R26.2] Patient Active Problem List   Diagnosis Date Noted   Dehydration 05/10/2024   Thyroid  cancer (HCC) 07/23/2022   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   PCP:  Marvine Rush, MD Pharmacy:   [facility] [place], [place] - [address] [place] [address] Phone: [phone] Fax:  [phone]     Social Drivers of Health (SDOH) Social History: SDOH Screenings   Food Insecurity: No Food Insecurity (05/10/2024)  Housing: Low Risk  (05/10/2024)  Transportation Needs: No Transportation Needs  (05/10/2024)  Utilities: Not At Risk (05/10/2024)  Social Connections: Moderately Integrated (05/10/2024)  Tobacco Use: Medium Risk (05/10/2024)   SDOH Interventions:     Readmission Risk Interventions     No data to display

## 2024-05-11 NOTE — Progress Notes (Signed)
 Initial Nutrition Assessment  DOCUMENTATION CODES:   Severe malnutrition in context of chronic illness  INTERVENTION:  Regular diet  Ensure Plus High Protein po BID, each supplement provides 350 kcal and 20 grams of protein. -1 packet Juven BID, each packet provides 95 calories, 2.5 grams of protein (collagen), and 9.8 grams of carbohydrate (3 grams sugar); also contains 7 grams of L-arginine and L-glutamine, 300 mg vitamin C, 15 mg vitamin E, 1.2 mcg vitamin B-12, 9.5 mg zinc , 200 mg calcium , and 1.5 g  Calcium  Beta-hydroxy-Beta-methylbutyrate to support wound healing Continue MVI w/minerals.  Monitor electrolytes and replete as needed, at risk for refeeding    NUTRITION DIAGNOSIS:   Severe Malnutrition related to chronic illness as evidenced by energy intake < 75% for > or equal to 1 month, moderate fat depletion, severe muscle depletion.    GOAL:   Patient will meet greater than or equal to 90% of their needs    MONITOR:   PO intake, Supplement acceptance  REASON FOR ASSESSMENT:   Consult Assessment of nutrition requirement/status, Poor PO  ASSESSMENT:   medical history significant of HTN, DM who presented to the emergency department with increased weakness and falls as well as worsening confusion over the past week prior to admission.  Met with patient at bedside, sleeping but awakens easily. Patient reports eating most of his lunch today and some of breakfast. Reports he has had a steady decline in his appetite resulting in ongoing poor PO intake. Patient denies and chewing or swallowing difficulties. No reports of n/v/c/d. Patient has not tried ensure in the past but agreed to drink in between meals to supplement PO intake. Patient endorses associated weight loss but unable to quantify a specific amount. Chart hx shows #0% weight loss over the past year and a half. Stage II pressure injury to buttocks POA.   Labs:  Glu 128  Bun 36 Cr 1.85  Calcium  7.5 (corrected  9.1) GFR 35  Meds: SSI Novolog  nightly  SSI Novolog  TID with meals  25 units Lantus  BID  Synthroid  daily  MVI w/minerals daily     Wt Readings from Last 10 Encounters:  05/10/24 60.8 kg  10/14/22 86.2 kg  07/23/22 86.2 kg  07/16/22 86.8 kg  06/06/22 87.3 kg  05/22/22 87.1 kg  05/15/22 87.1 kg    NUTRITION - FOCUSED PHYSICAL EXAM:  Flowsheet Row Most Recent Value  Orbital Region Moderate depletion  Upper Arm Region Severe depletion  Buccal Region Moderate depletion  Temple Region Moderate depletion  Clavicle Bone Region Moderate depletion  Clavicle and Acromion Bone Region Severe depletion  Scapular Bone Region Severe depletion  Patellar Region Severe depletion  Anterior Thigh Region Severe depletion  Posterior Calf Region Moderate depletion  Hair Reviewed  Eyes Reviewed  Mouth Reviewed  Skin Reviewed  Nails Reviewed    Diet Order:   Diet Order             Diet regular Room service appropriate? Yes; Fluid consistency: Thin  Diet effective now                   EDUCATION NEEDS:   No education needs have been identified at this time  Skin:  Skin Assessment: Skin Integrity Issues: Skin Integrity Issues:: Stage II Stage II: bilateral buttocks  Last BM:  PTA  Height:   Ht Readings from Last 1 Encounters:  05/10/24 5' 11 (1.803 m)    Weight:   Wt Readings from Last 1 Encounters:  05/10/24  60.8 kg    Ideal Body Weight:  78.18 kg  BMI:  Body mass index is 18.69 kg/m.  Estimated Nutritional Needs:   Kcal:  8297-8053 kcals  Protein:  73-91 grams  Fluid:  >1.5L/d    Jeremy Desrocher, MS, RD, LDN Clinical Dietitian  Please see AMiON for contact information.

## 2024-05-11 NOTE — Progress Notes (Signed)
 Patient was seen for hypotension.   HPI: 89 yr old man with HTN and DM presents with generalized weakness, confusion, and inability to void. He was found to have urinary retention from obstructing prostate and required Urology for Foley insertion. Urine was noted to be turbid and nitrite positive and he has developed marked leukocytosis and AKI.   He was treated with 2 g IV Rocephin  and 1 liter NS and was admitted to the hospitalist service. He developed hypotension, tachypnea, and hypoxia. He was given another 750 cc IVF and was started on albumin .   S: Denies chest pain, lightheadedness, or abdominal pain.   O: BP 80/40, HR 76, RR 23, O2 sat 96% on 6 Lpm   He is somnolent, wakes to voice and answers questions but falls asleep during discussion. Facial ecchymoses noted, fine rales on chest auscultation bilaterally, HR ~100 and regular, abdomen soft and non-tender.   He appears improved from when he initially arrived in the Progressive unit per RN.   BP improved to 107 systolic during encounter.   A/P: Concern for developing urosepsis. Additional IVF considered but there is concern for pulmonary edema on CXR and he is requiring 6 Lpm supplemental O2.   Plan to give 10 mg midodrine  now, complete the albumin  currently infusing, culture blood and urine, continue Rocephin  2 g q24h.

## 2024-05-11 NOTE — Progress Notes (Signed)
 Mobility Specialist Progress Note;   05/11/24 1415  Mobility  Activity Pivoted/transferred from chair to bed  Level of Assistance Minimal assist, patient does 75% or more  Assistive Device Front wheel walker  Distance Ambulated (ft) 5 ft  Activity Response Tolerated well  Mobility Referral Yes  Mobility visit 1 Mobility  Mobility Specialist Start Time (ACUTE ONLY) 1415  Mobility Specialist Stop Time (ACUTE ONLY) 1421  Mobility Specialist Time Calculation (min) (ACUTE ONLY) 6 min   During-mobility: HR up to 119 bpm Post-mobility: BP 110/54 (69)  Pt requesting assistance back to bed. Required MinA+2 to safely transfer pt from chair to bed. VC required for safety. No c/o when asked and VSS on supplemental O2. Pt left comfortably in bed with all needs met, alarm on.   Lauraine Erm Mobility Specialist Please contact via SecureChat or [employer] [phone]

## 2024-05-11 NOTE — Plan of Care (Signed)
  Problem: Education: Goal: Ability to describe self-care measures that may prevent or decrease complications (Diabetes Survival Skills Education) will improve Outcome: Progressing Goal: Individualized Educational Video(s) Outcome: Progressing   Problem: Coping: Goal: Ability to adjust to condition or change in health will improve Outcome: Progressing   Problem: Fluid Volume: Goal: Ability to maintain a balanced intake and output will improve Outcome: Progressing   Problem: Metabolic: Goal: Ability to maintain appropriate glucose levels will improve Outcome: Progressing   Problem: Nutritional: Goal: Maintenance of adequate nutrition will improve Outcome: Progressing Goal: Progress toward achieving an optimal weight will improve Outcome: Progressing   Problem: Skin Integrity: Goal: Risk for impaired skin integrity will decrease Outcome: Progressing   Problem: Education: Goal: Knowledge of General Education information will improve Description: Including pain rating scale, medication(s)/side effects and non-pharmacologic comfort measures Outcome: Progressing   Problem: Health Behavior/Discharge Planning: Goal: Ability to manage health-related needs will improve Outcome: Progressing   Problem: Clinical Measurements: Goal: Ability to maintain clinical measurements within normal limits will improve Outcome: Progressing Goal: Will remain free from infection Outcome: Progressing Goal: Diagnostic test results will improve Outcome: Progressing Goal: Respiratory complications will improve Outcome: Progressing Goal: Cardiovascular complication will be avoided Outcome: Progressing   Problem: Activity: Goal: Risk for activity intolerance will decrease Outcome: Progressing   Problem: Nutrition: Goal: Adequate nutrition will be maintained Outcome: Progressing   Problem: Coping: Goal: Level of anxiety will decrease Outcome: Progressing   Problem: Elimination: Goal: Will  not experience complications related to bowel motility Outcome: Progressing Goal: Will not experience complications related to urinary retention Outcome: Progressing   Problem: Pain Managment: Goal: General experience of comfort will improve and/or be controlled Outcome: Progressing   Problem: Safety: Goal: Ability to remain free from injury will improve Outcome: Progressing   Problem: Skin Integrity: Goal: Risk for impaired skin integrity will decrease Outcome: Progressing   Problem: Fluid Volume: Goal: Hemodynamic stability will improve Outcome: Progressing   Problem: Respiratory: Goal: Ability to maintain adequate ventilation will improve Outcome: Progressing

## 2024-05-11 NOTE — Progress Notes (Signed)
       Overnight   NAME: ADYN SERNA MRN: [id number] DOB : 09/22/1934    Date of Service   05/11/2024   HPI/Events of Note    Notified by RN for Hypotension and SPO2  Brief History  89 y.o. male History of HTN, DM  Admitted through ER for weakness, falls, possible bladder outlet obstruction.  Notified Rapid Response RN for bedside evaluation  Recommends transfer to higher acuity floor. CBG , vitals obtained   Fluid bolus  Zofran  ordered - completed  Albumin  currently infusing  Vitals pending Transfer pending    Interventions/ Plan   Transfer to PCU  Continue Albumin  Continue all previous Attending orders  Notify RR RN for any immediate deterioration  Consider CCM consult if worsens      Lynwood Kipper BSN MSNA MSN ACNPC-AG Acute Care Nurse Practitioner [facility]

## 2024-05-11 NOTE — Evaluation (Signed)
 Physical Therapy Evaluation Patient Details Name: Jeremy Ferrell MRN: [id number] DOB: 20-Apr-1935 Today's Date: 05/11/2024  History of Present Illness  Pt is 89 year old presented to [facility] on 05/10/24 for falls, generalized weakness, and confusion. Pt with possible bladder outlet obstruction and transferred to [facility]. Foley placed. Pt developed hypotension. PMH - HTN, DM, thyroid  CA  Clinical Impression  Pt admitted with above diagnosis and presents to PT with functional limitations due to deficits listed below (See PT problem list). Pt needs skilled PT to maximize independence and safety. Pt lives alone and currently requiring assist for mobility. Patient will benefit from continued inpatient follow up therapy, <3 hours/day.          If plan is discharge home, recommend the following: A little help with walking and/or transfers;A little help with bathing/dressing/bathroom;Assistance with cooking/housework;Assist for transportation;Direct supervision/assist for financial management;Direct supervision/assist for medications management;Help with stairs or ramp for entrance   Can travel by private vehicle   Yes    Equipment Recommendations None recommended by PT  Recommendations for Other Services       Functional Status Assessment Patient has had a recent decline in their functional status and demonstrates the ability to make significant improvements in function in a reasonable and predictable amount of time.     Precautions / Restrictions Precautions Precautions: Fall Restrictions Weight Bearing Restrictions Per Provider Order: No      Mobility  Bed Mobility Overal bed mobility: Needs Assistance Bed Mobility: Supine to Sit     Supine to sit: Min assist, HOB elevated     General bed mobility comments: Assist to elevate trunk into sitting    Transfers Overall transfer level: Needs assistance Equipment used: Rolling walker (2 wheels) Transfers: Sit to/from Stand Sit to  Stand: Min assist, From elevated surface           General transfer comment: Assist to power up and stabilize    Ambulation/Gait Ambulation/Gait assistance: Min assist Gait Distance (Feet): 30 Feet Assistive device: Rolling walker (2 wheels) Gait Pattern/deviations: Step-through pattern, Decreased step length - right, Decreased step length - left, Decreased dorsiflexion - left, Steppage, Trunk flexed Gait velocity: decr Gait velocity interpretation: <1.8 ft/sec, indicate of risk for recurrent falls   General Gait Details: Assist for balance. Pt reports foot drop is chronic  Stairs            Wheelchair Mobility     Tilt Bed    Modified Rankin (Stroke Patients Only)       Balance Overall balance assessment: Needs assistance Sitting-balance support: No upper extremity supported, Feet supported Sitting balance-Leahy Scale: Good     Standing balance support: No upper extremity supported, During functional activity Standing balance-Leahy Scale: Fair                               Pertinent Vitals/Pain Pain Assessment Pain Assessment: No/denies pain    Home Living Family/patient expects to be discharged to:: Private residence Living Arrangements: Alone Available Help at Discharge: Family;Available PRN/intermittently Type of Home: House Home Access: Stairs to enter Entrance Stairs-Rails: Right;Left;Can reach both Entrance Stairs-Number of Steps: 2   Home Layout: Two level;Able to live on main level with bedroom/bathroom Home Equipment: Rolling Walker (2 wheels);Cane - single point;BSC/3in1      Prior Function Prior Level of Function : Independent/Modified Independent;Driving;History of Falls (last six months)  Mobility Comments: Uses rolling walker       Extremity/Trunk Assessment   Upper Extremity Assessment Upper Extremity Assessment: Defer to OT evaluation    Lower Extremity Assessment Lower Extremity Assessment:  Generalized weakness;LLE deficits/detail LLE Deficits / Details: 0/5 dorsiflexion - chronic    Cervical / Trunk Assessment Cervical / Trunk Assessment: Kyphotic  Communication   Communication Communication: Impaired Factors Affecting Communication: Hearing impaired    Cognition Arousal: Alert Behavior During Therapy: WFL for tasks assessed/performed   PT - Cognitive impairments: Orientation, Memory, Problem solving, Safety/Judgement   Orientation impairments: Time                     Following commands: Impaired Following commands impaired: Follows one step commands with increased time, Only follows one step commands consistently     Cueing Cueing Techniques: Verbal cues, Tactile cues     General Comments General comments (skin integrity, edema, etc.): Pt on 7L O2. Poor pleth for SpO2.    Exercises     Assessment/Plan    PT Assessment Patient needs continued PT services  PT Problem List Decreased strength;Decreased activity tolerance;Decreased balance;Decreased mobility;Decreased cognition       PT Treatment Interventions DME instruction;Gait training;Functional mobility training;Therapeutic activities;Therapeutic exercise;Balance training;Patient/family education    PT Goals (Current goals can be found in the Care Plan section)  Acute Rehab PT Goals Patient Stated Goal: return home PT Goal Formulation: With patient Time For Goal Achievement: 05/25/24 Potential to Achieve Goals: Good    Frequency Min 2X/week     Co-evaluation PT/OT/SLP Co-Evaluation/Treatment: Yes Reason for Co-Treatment: For patient/therapist safety PT goals addressed during session: Mobility/safety with mobility;Balance         AM-PAC PT 6 Clicks Mobility  Outcome Measure Help needed turning from your back to your side while in a flat bed without using bedrails?: A Little Help needed moving from lying on your back to sitting on the side of a flat bed without using bedrails?:  A Little Help needed moving to and from a bed to a chair (including a wheelchair)?: A Little Help needed standing up from a chair using your arms (e.g., wheelchair or bedside chair)?: A Little Help needed to walk in hospital room?: A Little Help needed climbing 3-5 steps with a railing? : A Lot 6 Click Score: 17    End of Session Equipment Utilized During Treatment: Gait belt;Oxygen Activity Tolerance: Patient tolerated treatment well Patient left: in chair;with call bell/phone within reach;with chair alarm set Nurse Communication: Mobility status PT Visit Diagnosis: Unsteadiness on feet (R26.81);Other abnormalities of gait and mobility (R26.89);Muscle weakness (generalized) (M62.81);History of falling (Z91.81);Repeated falls (R29.6)    Time: 8953-8880 PT Time Calculation (min) (ACUTE ONLY): 33 min   Charges:   PT Evaluation $PT Eval Moderate Complexity: 1 Mod   PT General Charges $$ ACUTE PT VISIT: 1 Visit         [facility] PT Acute Rehabilitation Services Office [phone]   Rodgers ORN [facility] 05/11/2024, 1:16 PM

## 2024-05-11 NOTE — Progress Notes (Signed)
 Spoke to CHRISTELLA Ganong, MD and she confirmed that foley was acceptable and to cancel CBI order.

## 2024-05-11 NOTE — Evaluation (Signed)
 Occupational Therapy Evaluation Patient Details Name: Jeremy Ferrell MRN: [id number] DOB: 1934/09/16 Today's Date: 05/11/2024   History of Present Illness   Pt is 89 year old presented to [facility] on 05/10/24 for falls, generalized weakness, and confusion. Pt with possible bladder outlet obstruction and transferred to [facility]. Foley placed. Pt developed hypotension. PMH - HTN, DM, thyroid  CA     Clinical Impressions Pt presents with decline in function and safety with ADLs and ADL mobility with impaired strength, balance and endurance. PTA pt reports that he lives alone and was ind with ADLs/selfcare, used a RW, drives and cooks (pt's son lives nearby). Pt currently requires min A wot sit EOB, mod A with LB ADLs, min A with mobility/transfers using RW, min A with toileting tasks. Pt very pleasant and cooperative. OT will follow acutely to maximize level of function and safety     If plan is discharge home, recommend the following:   A little help with bathing/dressing/bathroom;A little help with walking and/or transfers;Assistance with cooking/housework;Assist for transportation;Help with stairs or ramp for entrance     Functional Status Assessment   Patient has had a recent decline in their functional status and demonstrates the ability to make significant improvements in function in a reasonable and predictable amount of time.     Equipment Recommendations   Other (comment) (defer)     Recommendations for Other Services         Precautions/Restrictions   Precautions Precautions: Fall Restrictions Weight Bearing Restrictions Per Provider Order: No     Mobility Bed Mobility Overal bed mobility: Needs Assistance Bed Mobility: Supine to Sit     Supine to sit: Min assist, HOB elevated     General bed mobility comments: Assist to elevate trunk into sitting    Transfers Overall transfer level: Needs assistance Equipment used: Rolling walker (2 wheels) Transfers:  Sit to/from Stand Sit to Stand: Min assist, From elevated surface                  Balance Overall balance assessment: Needs assistance   Sitting balance-Leahy Scale: Good     Standing balance support: No upper extremity supported, During functional activity Standing balance-Leahy Scale: Fair                             ADL either performed or assessed with clinical judgement   ADL Overall ADL's : Needs assistance/impaired Eating/Feeding: Independent   Grooming: Wash/dry hands;Wash/dry face;Contact guard assist;Standing   Upper Body Bathing: Set up;Supervision/ safety;Sitting   Lower Body Bathing: Moderate assistance   Upper Body Dressing : Set up;Supervision/safety;Sitting   Lower Body Dressing: Moderate assistance   Toilet Transfer: Minimal assistance;Ambulation;Rolling walker (2 wheels);Cueing for safety;Regular Toilet;Grab bars   Toileting- Clothing Manipulation and Hygiene: Minimal assistance;Sit to/from stand       Functional mobility during ADLs: Minimal assistance;Rolling walker (2 wheels);Cueing for safety       Vision Baseline Vision/History: 1 Wears glasses Ability to See in Adequate Light: 0 Adequate Patient Visual Report: No change from baseline       Perception         Praxis         Pertinent Vitals/Pain Pain Assessment Pain Assessment: No/denies pain     Extremity/Trunk Assessment Upper Extremity Assessment Upper Extremity Assessment: Generalized weakness;Right hand dominant   Lower Extremity Assessment Lower Extremity Assessment: Defer to PT evaluation LLE Deficits / Details: 0/5 dorsiflexion - chronic  Cervical / Trunk Assessment Cervical / Trunk Assessment: Kyphotic   Communication Communication Communication: Impaired Factors Affecting Communication: Hearing impaired   Cognition Arousal: Alert Behavior During Therapy: WFL for tasks assessed/performed                                  Following commands: Impaired Following commands impaired: Follows one step commands with increased time, Only follows one step commands consistently     Cueing  General Comments   Cueing Techniques: Verbal cues;Tactile cues  Pt on 7L O2. Poor pleth for SpO2.   Exercises     Shoulder Instructions      Home Living Family/patient expects to be discharged to:: Private residence Living Arrangements: Alone Available Help at Discharge: Family;Available PRN/intermittently Type of Home: House Home Access: Stairs to enter [employer] of Steps: 2 Entrance Stairs-Rails: Right;Left;Can reach both Home Layout: Two level;Able to live on main level with bedroom/bathroom     Bathroom Shower/Tub: Sponge bathes at baseline   Bathroom Toilet: Standard     Home Equipment: Agricultural consultant (2 wheels);Cane - single point;BSC/3in1          Prior Functioning/Environment Prior Level of Function : Independent/Modified Independent;Driving;History of Falls (last six months)             Mobility Comments: Uses rolling walker ADLs Comments: Ind with ADLs, bathes at sink, has BSC downstairs    OT Problem List: Decreased strength;Decreased activity tolerance;Impaired balance (sitting and/or standing)   OT Treatment/Interventions: Self-care/ADL training;Patient/family education;Therapeutic exercise;Balance training;Therapeutic activities;DME and/or AE instruction      OT Goals(Current goals can be found in the care plan section)   Acute Rehab OT Goals Patient Stated Goal: go home OT Goal Formulation: With patient Time For Goal Achievement: 05/25/24 Potential to Achieve Goals: Good ADL Goals Pt Will Perform Grooming: with supervision;with set-up;standing Pt Will Perform Lower Body Bathing: with min assist;with contact guard assist;sitting/lateral leans;sit to/from stand Pt Will Perform Lower Body Dressing: with min assist;with contact guard assist;sitting/lateral leans;sit  to/from stand Pt Will Transfer to Toilet: with contact guard assist;with supervision;ambulating Pt Will Perform Toileting - Clothing Manipulation and hygiene: with contact guard assist;with supervision;sitting/lateral leans;sit to/from stand   OT Frequency:  Min 2X/week    Co-evaluation PT/OT/SLP Co-Evaluation/Treatment: Yes Reason for Co-Treatment: For patient/therapist safety PT goals addressed during session: Mobility/safety with mobility;Balance OT goals addressed during session: ADL's and self-care;Proper use of Adaptive equipment and DME      AM-PAC OT 6 Clicks Daily Activity     Outcome Measure Help from another person eating meals?: None Help from another person taking care of personal grooming?: A Little Help from another person toileting, which includes using toliet, bedpan, or urinal?: A Little Help from another person bathing (including washing, rinsing, drying)?: A Lot Help from another person to put on and taking off regular upper body clothing?: A Little Help from another person to put on and taking off regular lower body clothing?: A Lot 6 Click Score: 17   End of Session Equipment Utilized During Treatment: Gait belt;Rolling walker (2 wheels) Nurse Communication: Mobility status  Activity Tolerance: Patient tolerated treatment well Patient left: in chair;with call bell/phone within reach;with chair alarm set  OT Visit Diagnosis: Unsteadiness on feet (R26.81);Other abnormalities of gait and mobility (R26.89);Muscle weakness (generalized) (M62.81)                Time: 8957-8885 OT Time Calculation (min): 32 min  Charges:  OT General Charges $OT Visit: 1 Visit OT Evaluation $OT Eval Moderate Complexity: 1 Mod   Jacques Karna Loose 05/11/2024, 1:24 PM

## 2024-05-11 NOTE — Progress Notes (Signed)
  Progress Note   Patient: Jeremy Ferrell FMW:[id number] DOB: 10/24/1934 DOA: 05/10/2024     1 DOS: the patient was seen and examined on 05/11/2024   Brief hospital course: 89 y.o. male with medical history significant of HTN, DM who presented to the emergency department with increased weakness and falls as well as worsening confusion over the past week prior to admission.  Patient was transferred from [facility] to [facility] with concerns of bladder outlet obstruction.  Difficult Foley cath placement was noted, ultimately placed by urology in the emergency department.  During evaluation, patient was noted to have fallen multiple times at home resulting in closed head injury.  Multiple bruising noted throughout.  Family also reports increased confusion and weakness in setting of very poor p.o. intake 1 week prior to this admission.  Of note, decreased urine was noted following Foley cath placement.  Urine also was noted to be concentrated in appearance.  Per ED physician, mucous membranes did appear dry.  Hospital service consulted for consideration for medical admission   Assessment and Plan: Sepsis with UTI and septic shock -UA c/w UTI with urine cx pending. Overnight with hypotension requiring fluid bolus -WBC 20.2 -Continued on empiric rocephin  -clinically seems improved today  Dehydration -Likely in setting of poor p.o. intake as observed by family member -Mucous membranes were very dry, skin turgor poor at time of presentation -Orthostatic vital signs reviewed, patient is markedly orthostatic with systolic blood pressure 111 down to 82 on sitting -Pt had been continued on IVF   History of hypertension -Hold bp meds secondary to hypotension    Diabetes -Continue sliding scale insulin  as needed -cont on reduced dose of scheduled insulin  -Hold home metformin    Underweight -Will consult dietitian   Bladder outlet obstruction -Patient is now status post Foley  cath placement in emergency department -Discussed case with urologist.  Recommendation for reevaluation and voiding trial in 6 to 7 days.  If patient remains in hospital at that time, would formally consult Urology while inpatient, otherwise would have patient follow-up as an outpatient in [place] if discharged by then   Severe protein calorie malnutrition - Dietitian consulted         Subjective: Hypotension overnight requiring fluid bolus. This AM, reports feeling better  Physical Exam: Vitals:   05/11/24 0425 05/11/24 0700 05/11/24 1219 05/11/24 1607  BP: (!) 156/74 116/65 (!) 113/45 (!) 109/52  Pulse: 94 79 (!) 111 93  Resp:  (!) 29 (!) 24 (!) 27  Temp: 98.4 F (36.9 C) 97.7 F (36.5 C) 97.8 F (36.6 C) 98.8 F (37.1 C)  TempSrc: Axillary Oral Oral Oral  SpO2: 93% 97% 99% 95%  Weight:      Height:       General exam: Awake, laying in bed, in nad Respiratory system: Normal respiratory effort, no wheezing Cardiovascular system: regular rate, s1, s2 Gastrointestinal system: Soft, nondistended, positive BS Central nervous system: CN2-12 grossly intact, strength intact Extremities: Perfused, no clubbing Skin: Normal skin turgor, no notable skin lesions seen Psychiatry: Mood normal // no visual hallucinations   Data Reviewed:  Labs reviewed: Na 141, K 3.6, Cr 1.85, WBC 20.2, Hgb 13.0, Plts 415  Family Communication: Pt in room, family not at bedside  Disposition: Status is: Inpatient Remains inpatient appropriate because: severity of illness  Planned Discharge Destination: Skilled nursing facility    Author: Garnette Pelt, MD 05/11/2024 5:35 PM  For on call review [email or web address].

## 2024-05-11 NOTE — NC FL2 (Signed)
 [place]  MEDICAID FL2 LEVEL OF CARE FORM     IDENTIFICATION  Patient Name: Jeremy Ferrell Birthdate: 05-Jul-1935 Sex: male Admission Date (Current Location): 05/10/2024  [facility] and [place] Number:  Producer, television/film/video and Address:  [facility], 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    Orientation RESPIRATION BLADDER Height & Weight     Self, Time, Situation, Place  O2 (Nasal Cannula 6 liters)  (Urethral Catheter) Weight: 134 lb (60.8 kg) Height:  5' 11 (180.3 cm)  BEHAVIORAL SYMPTOMS/MOOD NEUROLOGICAL BOWEL NUTRITION STATUS        Diet (Please see discharge summary)  AMBULATORY STATUS COMMUNICATION OF NEEDS Skin   Limited Assist Verbally Other (Comment) (Abrasion,head,arm,Leg,Bil.,Ecchymosis,arm,Leg,Bil.,Erythema,Buttocks,heel,Bil.,Wound/Incision LDAs,Wound PI Buttocks,R,L,stage 2)                       Personal Care Assistance Level of Assistance  Bathing, Feeding, Dressing Bathing Assistance: Limited assistance Feeding assistance: Independent Dressing Assistance: Limited assistance     Functional Limitations Info  Sight, Hearing, Speech Sight Info: Adequate Hearing Info: Adequate Speech Info: Adequate    SPECIAL CARE FACTORS FREQUENCY  PT (By licensed PT), OT (By licensed OT)     PT Frequency: 5x min weekly OT Frequency: 5x min weekly            Contractures  Contractures Info: Not present    Additional Factors Info  Code Status, Allergies Code Status Info: FULL Allergies Info: Penicillins           Current Medications (05/11/2024):  This is the current hospital active medication list Current Facility-Administered Medications  Medication Dose Route Frequency Provider Last Rate Last Admin   acetaminophen  (TYLENOL ) tablet 650 mg  650 mg Oral Q6H PRN Cindy Garnette POUR, MD       Or   acetaminophen  (TYLENOL ) suppository 650 mg  650 mg Rectal Q6H PRN Cindy Garnette POUR, MD       artificial tears ophthalmic solution 1 drop  1 drop Both Eyes BID Haviland, Julie, MD   1 drop at 05/11/24 0935   cefTRIAXone  (ROCEPHIN ) 2 g in sodium chloride  0.9 % 100 mL IVPB  2 g Intravenous Q24H Opyd, Timothy S, MD       Chlorhexidine  Gluconate Cloth 2 % PADS 6 each  6 each Topical Daily Haviland, Julie, MD       enoxaparin  (LOVENOX ) injection 30 mg  30 mg Subcutaneous Q24H Cindy Garnette POUR, MD       ezetimibe  (ZETIA ) tablet 10 mg  10 mg Oral QHS Haviland, Julie, MD   10 mg at 05/11/24 0140   insulin  aspart (novoLOG ) injection 0-5 Units  0-5 Units Subcutaneous QHS Cindy Garnette POUR, MD       insulin  aspart (novoLOG ) injection 0-9 Units  0-9 Units Subcutaneous TID WC Chiu, Stephen K, MD   1 Units at 05/11/24 0940   insulin  glargine (LANTUS ) injection  25 Units  25 Units Subcutaneous BID Cindy Garnette POUR, MD   25 Units at 05/11/24 9058   lactated ringers  infusion   Intravenous Continuous Cindy Garnette POUR, MD   Stopped at 05/10/24 2123   levothyroxine  (SYNTHROID ) tablet 25 mcg  25 mcg Oral Q0600 Haviland, Julie, MD   25 mcg at 05/11/24 0656   multivitamin with minerals tablet 1 tablet  1 tablet Oral Daily Haviland, Julie, MD   1 tablet at 05/11/24 9063   ondansetron  (ZOFRAN ) injection 4 mg  4 mg Intravenous Q6H PRN Daniels, James K, NP   4 mg at 05/10/24 2312   simvastatin  (ZOCOR ) tablet 20 mg  20 mg Oral QHS Haviland, Julie, MD   20 mg at 05/11/24 9862   sodium chloride  irrigation  0.9 % 3,000 mL  3,000 mL Irrigation Continuous Haviland, Julie, MD       tamsulosin  (FLOMAX ) capsule 0.4 mg  0.4 mg Oral QHS Haviland, Julie, MD   0.4 mg at 05/11/24 0137     Discharge Medications: Please see discharge summary for a list of discharge medications.  Relevant Imaging Results:  Relevant Lab Results:   Additional Information [id number]  Isaiah Public, LCSWA

## 2024-05-11 NOTE — Progress Notes (Signed)
 Son, Fernie Grimm, notified of transfer to [phone].

## 2024-05-12 ENCOUNTER — Inpatient Hospital Stay (HOSPITAL_COMMUNITY)

## 2024-05-12 DIAGNOSIS — E0580 Other thyrotoxicosis without thyrotoxic crisis or storm: Secondary | ICD-10-CM | POA: Diagnosis not present

## 2024-05-12 DIAGNOSIS — L8990 Pressure ulcer of unspecified site, unspecified stage: Secondary | ICD-10-CM | POA: Insufficient documentation

## 2024-05-12 DIAGNOSIS — R262 Difficulty in walking, not elsewhere classified: Secondary | ICD-10-CM

## 2024-05-12 DIAGNOSIS — R0603 Acute respiratory distress: Secondary | ICD-10-CM

## 2024-05-12 DIAGNOSIS — R600 Localized edema: Secondary | ICD-10-CM | POA: Diagnosis not present

## 2024-05-12 DIAGNOSIS — R918 Other nonspecific abnormal finding of lung field: Secondary | ICD-10-CM

## 2024-05-12 DIAGNOSIS — E860 Dehydration: Secondary | ICD-10-CM | POA: Diagnosis not present

## 2024-05-12 LAB — BASIC METABOLIC PANEL WITH GFR
Anion gap: 13 (ref 5–15)
BUN: 38 mg/dL — ABNORMAL HIGH (ref 8–23)
CO2: 25 mmol/L (ref 22–32)
Calcium: 8.6 mg/dL — ABNORMAL LOW (ref 8.9–10.3)
Chloride: 104 mmol/L (ref 98–111)
Creatinine, Ser: 1.51 mg/dL — ABNORMAL HIGH (ref 0.61–1.24)
GFR, Estimated: 45 mL/min — ABNORMAL LOW (ref >60)
Glucose, Bld: 106 mg/dL — ABNORMAL HIGH (ref 70–99)
Potassium: 4 mmol/L (ref 3.5–5.1)
Sodium: 142 mmol/L (ref 135–145)

## 2024-05-12 LAB — GLUCOSE, PLEURAL OR PERITONEAL FLUID
Glucose, Fluid: 140 mg/dL
Glucose, Fluid: 157 mg/dL

## 2024-05-12 LAB — PROTEIN, PLEURAL OR PERITONEAL FLUID
Total protein, fluid: <3 g/dL
Total protein, fluid: <3 g/dL

## 2024-05-12 LAB — COMPREHENSIVE METABOLIC PANEL WITH GFR
ALT: 13 U/L (ref 0–44)
AST: 17 U/L (ref 15–41)
Albumin: 2 g/dL — ABNORMAL LOW (ref 3.5–5.0)
Alkaline Phosphatase: 49 U/L (ref 38–126)
Anion gap: 10 (ref 5–15)
BUN: 30 mg/dL — ABNORMAL HIGH (ref 8–23)
CO2: 25 mmol/L (ref 22–32)
Calcium: 7.7 mg/dL — ABNORMAL LOW (ref 8.9–10.3)
Chloride: 106 mmol/L (ref 98–111)
Creatinine, Ser: 0.98 mg/dL (ref 0.61–1.24)
GFR, Estimated: >60 mL/min (ref >60)
Glucose, Bld: 72 mg/dL (ref 70–99)
Potassium: 3.6 mmol/L (ref 3.5–5.1)
Sodium: 141 mmol/L (ref 135–145)
Total Bilirubin: 0.7 mg/dL (ref 0.0–1.2)
Total Protein: 4.6 g/dL — ABNORMAL LOW (ref 6.5–8.1)

## 2024-05-12 LAB — BODY FLUID CELL COUNT WITH DIFFERENTIAL
Eos, Fluid: 0 %
Eos, Fluid: 0 %
Lymphs, Fluid: 78 %
Lymphs, Fluid: 19 %
Monocyte-Macrophage-Serous Fluid: 9 % — ABNORMAL LOW (ref 50–90)
Monocyte-Macrophage-Serous Fluid: 2 % — ABNORMAL LOW (ref 50–90)
Neutrophil Count, Fluid: 13 % (ref 0–25)
Neutrophil Count, Fluid: 79 % — ABNORMAL HIGH (ref 0–25)
Total Nucleated Cell Count, Fluid: 396 uL (ref 0–1000)
Total Nucleated Cell Count, Fluid: 710 uL (ref 0–1000)

## 2024-05-12 LAB — GLUCOSE, CAPILLARY
Glucose-Capillary: 66 mg/dL — ABNORMAL LOW (ref 70–99)
Glucose-Capillary: 107 mg/dL — ABNORMAL HIGH (ref 70–99)
Glucose-Capillary: 198 mg/dL — ABNORMAL HIGH (ref 70–99)
Glucose-Capillary: 121 mg/dL — ABNORMAL HIGH (ref 70–99)
Glucose-Capillary: 78 mg/dL (ref 70–99)

## 2024-05-12 LAB — MRSA NEXT GEN BY PCR, NASAL: MRSA by PCR Next Gen: NOT DETECTED

## 2024-05-12 LAB — CBC
HCT: 36.1 % — ABNORMAL LOW (ref 39.0–52.0)
Hemoglobin: 12.3 g/dL — ABNORMAL LOW (ref 13.0–17.0)
MCH: 33.3 pg (ref 26.0–34.0)
MCHC: 34.1 g/dL (ref 30.0–36.0)
MCV: 97.8 fL (ref 80.0–100.0)
Platelets: 353 K/uL (ref 150–400)
RBC: 3.69 MIL/uL — ABNORMAL LOW (ref 4.22–5.81)
RDW: 14.2 % (ref 11.5–15.5)
WBC: 10.2 K/uL (ref 4.0–10.5)
nRBC: 0 % (ref 0.0–0.2)

## 2024-05-12 LAB — URINE CULTURE: Culture: NO GROWTH

## 2024-05-12 LAB — LACTATE DEHYDROGENASE, PLEURAL OR PERITONEAL FLUID
LD, Fluid: 117 U/L — ABNORMAL HIGH (ref 3–23)
LD, Fluid: 113 U/L — ABNORMAL HIGH (ref 3–23)

## 2024-05-12 LAB — TROPONIN I (HIGH SENSITIVITY)
Troponin I (High Sensitivity): 93 ng/L — ABNORMAL HIGH (ref <18)
Troponin I (High Sensitivity): 120 ng/L (ref <18)
Troponin I (High Sensitivity): 92 ng/L — ABNORMAL HIGH (ref <18)
Troponin I (High Sensitivity): 82 ng/L — ABNORMAL HIGH (ref <18)

## 2024-05-12 LAB — BRAIN NATRIURETIC PEPTIDE: B Natriuretic Peptide: 291.9 pg/mL — ABNORMAL HIGH (ref 0.0–100.0)

## 2024-05-12 MED ORDER — FUROSEMIDE 10 MG/ML IJ SOLN: 40.0000 mg | Freq: Two times a day (BID) | INTRAMUSCULAR | Status: DC

## 2024-05-12 MED ORDER — SODIUM CHLORIDE 0.9 % IV SOLN
2.0000 g | Freq: Two times a day (BID) | INTRAVENOUS | Status: DC
  Administered 2024-05-12 – 2024-05-13 (×2): 2 g via INTRAVENOUS
  Filled 2024-05-12 (×2): qty 12.5

## 2024-05-12 MED ORDER — METRONIDAZOLE 500 MG/100ML IV SOLN
500.0000 mg | Freq: Two times a day (BID) | INTRAVENOUS | Status: DC
  Administered 2024-05-12 – 2024-05-13 (×2): 500 mg via INTRAVENOUS
  Filled 2024-05-12 (×2): qty 100

## 2024-05-12 MED ORDER — ALBUMIN HUMAN 25 % IV SOLN
25.0000 g | Freq: Four times a day (QID) | INTRAVENOUS | Status: AC
  Administered 2024-05-12 – 2024-05-13 (×3): 25 g via INTRAVENOUS
  Filled 2024-05-12 (×3): qty 100

## 2024-05-12 MED ORDER — FUROSEMIDE 10 MG/ML IJ SOLN
80.0000 mg | Freq: Once | INTRAMUSCULAR | Status: AC
  Administered 2024-05-12: 80 mg via INTRAVENOUS
  Filled 2024-05-12: qty 8

## 2024-05-12 MED ORDER — ENOXAPARIN SODIUM 40 MG/0.4ML IJ SOSY
40.0000 mg | PREFILLED_SYRINGE | INTRAMUSCULAR | Status: DC
  Administered 2024-05-12: 40 mg via SUBCUTANEOUS
  Filled 2024-05-12: qty 0.4

## 2024-05-12 MED ORDER — FUROSEMIDE 10 MG/ML IJ SOLN
20.0000 mg | Freq: Once | INTRAMUSCULAR | Status: AC
  Administered 2024-05-12: 20 mg via INTRAVENOUS

## 2024-05-12 MED ORDER — FUROSEMIDE 10 MG/ML IJ SOLN
20.0000 mg | Freq: Once | INTRAMUSCULAR | Status: AC
  Administered 2024-05-12: 20 mg via INTRAVENOUS
  Filled 2024-05-12: qty 2

## 2024-05-12 MED ORDER — DEXMEDETOMIDINE HCL IN NACL 400 MCG/100ML IV SOLN
INTRAVENOUS | Status: AC
  Administered 2024-05-12: 0.4 ug/kg/h via INTRAVENOUS
  Filled 2024-05-12: qty 100

## 2024-05-12 MED ORDER — DEXMEDETOMIDINE HCL IN NACL 400 MCG/100ML IV SOLN: 0.0000 ug/kg/h | INTRAVENOUS | Status: DC

## 2024-05-12 MED ORDER — IPRATROPIUM-ALBUTEROL 0.5-2.5 (3) MG/3ML IN SOLN
3.0000 mL | RESPIRATORY_TRACT | Status: DC | PRN
  Administered 2024-05-12 – 2024-05-14 (×3): 3 mL via RESPIRATORY_TRACT
  Filled 2024-05-12 (×3): qty 3

## 2024-05-12 MED ORDER — ASPIRIN 81 MG PO TBEC
81.0000 mg | DELAYED_RELEASE_TABLET | Freq: Every day | ORAL | Status: DC
Start: 2024-05-12 — End: 2024-06-11
  Administered 2024-05-12: 81 mg via ORAL
  Filled 2024-05-12 (×3): qty 1

## 2024-05-12 MED ORDER — FUROSEMIDE 10 MG/ML IJ SOLN
40.0000 mg | Freq: Once | INTRAMUSCULAR | Status: AC
Start: 2024-05-12 — End: 2024-05-12
  Administered 2024-05-12: 40 mg via INTRAVENOUS
  Filled 2024-05-12: qty 4

## 2024-05-12 MED ORDER — INSULIN ASPART 100 UNIT/ML IJ SOLN
0.0000 [IU] | INTRAMUSCULAR | Status: DC
  Administered 2024-05-12: 1 [IU] via SUBCUTANEOUS
  Administered 2024-05-13: 2 [IU] via SUBCUTANEOUS
  Administered 2024-05-13: 7 [IU] via SUBCUTANEOUS
  Administered 2024-05-14 (×2): 2 [IU] via SUBCUTANEOUS
  Administered 2024-05-14: 5 [IU] via SUBCUTANEOUS
  Administered 2024-05-15: 2 [IU] via SUBCUTANEOUS
  Administered 2024-05-15 (×2): 3 [IU] via SUBCUTANEOUS

## 2024-05-12 MED ORDER — ALBUMIN HUMAN 25 % IV SOLN
25.0000 g | Freq: Once | INTRAVENOUS | Status: AC
  Administered 2024-05-12: 25 g via INTRAVENOUS
  Filled 2024-05-12: qty 100

## 2024-05-12 NOTE — Progress Notes (Signed)
 Progress Note   Patient: Jeremy Ferrell FMW:[id number] DOB: Jan 23, 1935 DOA: 05/10/2024     2 DOS: the patient was seen and examined on 05/12/2024   Brief hospital course: 89 y.o. male with medical history significant of HTN, DM who presented to the emergency department with increased weakness and falls as well as worsening confusion over the past week prior to admission.  Patient was transferred from [facility] to [facility] with concerns of bladder outlet obstruction.  Difficult Foley cath placement was noted, ultimately placed by urology in the emergency department.  During evaluation, patient was noted to have fallen multiple times at home resulting in closed head injury.  Multiple bruising noted throughout.  Family also reports increased confusion and weakness in setting of very poor p.o. intake 1 week prior to this admission.  Of note, decreased urine was noted following Foley cath placement.  Urine also was noted to be concentrated in appearance.  Per ED physician, mucous membranes did appear dry.  Hospital service consulted for consideration for medical admission   Assessment and Plan: Sepsis with UTI and septic shock -UA c/w UTI at presentation. Noted to have profound hypotension shortly after admit, requiring aggressive IVF -WBC 20.2 initially -Pan cultures neg. Clinically improved with empiric rocephin  -on 9/23, noted to have increased sob and resp effort.  -Gave total 80mg  IV lasix  with IV albumin  with minimal urine outpt -Advanced to BiPAP, however pt did not improve and did not tolerate mask -Pt remained having dyspnea to RR 40-50's. Consulted Critical Care who has since transferred to ICU service  Dehydration -Likely in setting of poor p.o. intake as observed by family member -Mucous membranes were very dry, skin turgor poor at time of presentation -Orthostatic vital signs reviewed, patient is markedly orthostatic with systolic blood pressure 111 down to 82 on  sitting -Pt had been continued on IVF   History of hypertension -Hold bp meds secondary to hypotension    Diabetes -Continue sliding scale insulin  as needed -hypoglycemic this AM. Hold further scheduled insulin  -Hold home metformin    Underweight -Will consult dietitian   Bladder outlet obstruction -Patient is now status post Foley cath placement in emergency department -Discussed case with urologist.  Recommendation for reevaluation and voiding trial in 6 to 7 days.  If patient remains in hospital at that time, would formally consult Urology while inpatient, otherwise would have patient follow-up as an outpatient in [place] if discharged by then   Severe protein calorie malnutrition - Dietitian consulted      Subjective: Feels more sob this AM  Physical Exam: Vitals:   05/12/24 1348 05/12/24 1354 05/12/24 1358 05/12/24 1427  BP: (!) 165/79 (!) 151/91 (!) 176/104 (!) 150/79  Pulse: (!) 103 (!) 135 (!) 44 (!) 128  Resp: (!) 45 (!) 34 (!) 46 (!) 48  Temp:      TempSrc:      SpO2: 98% 99% 98% 98%  Weight:      Height:       General exam: Conversant, in no acute distress Respiratory system: normal chest rise, clear, no audible wheezing Cardiovascular system: regular rhythm, s1-s2 Gastrointestinal system: Nondistended, nontender, pos BS Central nervous system: No seizures, no tremors Extremities: No cyanosis, no joint deformities Skin: No rashes, no pallor Psychiatry: Affect normal // no auditory hallucinations   Data Reviewed:  Labs reviewed: Na 141, K 3.6, Cr 0.98, WBC 10.2, Hgb 12.3, Plts 353  Family Communication: Pt in room, family not at bedside  Disposition:  Status is: Inpatient Remains inpatient appropriate because: severity of illness  Planned Discharge Destination: Skilled nursing facility    CRITICAL CARE Performed by: Garnette Pelt   Total critical care time: 50 minutes  Critical care time was exclusive of separately billable procedures and  treating other patients.  Critical care was necessary to treat or prevent imminent or life-threatening deterioration.  Critical care was time spent personally by me on the following activities: development of treatment plan with patient and/or surrogate as well as nursing, discussions with consultants, evaluation of patient's response to treatment, examination of patient, obtaining history from patient or surrogate, ordering and performing treatments and interventions, ordering and review of laboratory studies, ordering and review of radiographic studies, pulse oximetry and re-evaluation of patient's condition.   Author: Garnette Pelt, MD 05/12/2024 3:55 PM  For on call review [email or web address].

## 2024-05-12 NOTE — Progress Notes (Signed)
 Patient complains of SOB and breathing appears labored while on HFNC 7L, RR fluctuates as high as 40s. Requesting blood gas. Flutter valve and incentive inspirometer ordered.  Also, patient has 10cc of urine documented after 60mg  of lasix  given. Flushed catheter twice. Bladder scan shows 240cc. Paged urology MD Manny. No new orders

## 2024-05-12 NOTE — TOC Progression Note (Addendum)
 Transition of Care [facility]) - Progression Note    Patient Details  Name: Jeremy Ferrell MRN: [id number] Date of Birth: 08/14/1935  [facility] CM/SW Contact  Isaiah Public, LCSWA Phone Number: 05/12/2024, 11:14 AM  Clinical Narrative:     CSW spoke with patient at bedside and provided medicare compare ratings list with accepted SNF bed offers. Patient accepted SNF bed offer with [facility]. [facility] with [facility] confirmed SNF bed for patient.  Expected Discharge Plan: Skilled Nursing Facility Barriers to Discharge: Continued Medical Work up               Expected Discharge Plan and Services In-house Referral: Clinical Social Work     Living arrangements for the past 2 months: Single Family Home                                       Social Drivers of Health (SDOH) Interventions SDOH Screenings   Food Insecurity: No Food Insecurity (05/10/2024)  Housing: Low Risk  (05/10/2024)  Transportation Needs: No Transportation Needs (05/10/2024)  Utilities: Not At Risk (05/10/2024)  Social Connections: Moderately Integrated (05/10/2024)  Tobacco Use: Medium Risk (05/10/2024)    Readmission Risk Interventions     No data to display

## 2024-05-12 NOTE — Consult Note (Addendum)
 WOC Nurse Consult Note: Reason for Consult: Requested to assess a sacrum wound POA. Wound type: Bilateral buttock sacrum wound stage 3. Pressure Injury POA: Yes Measurement: aprox. 2.3 x 2 cm on Right. 3.5 cm by 2.5 cm left. Wound bed: 90% superficial yellow slough, 10% red. Drainage (amount, consistency, odor) Minimum amount, serous. Periwound: intact, viable edges, erythema around the wounds. Dressing procedure/placement/frequency: Cleanse with Vashe [id number], not rinse, pat dry the peri-wound skin. Apply a single layer of xeroform to the wound bed daily, cover with foam dressing. If the foam is not saturated or soiling, can stay for up to 3 days, ok to lift, change the Xeroform and reapply.  WOC team will not plan to follow further. Please reconsult if further assistance is needed. Thank-you,  Lela Holm RN, CNS, [employer], MSN.  (Phone [phone])

## 2024-05-12 NOTE — Procedures (Signed)
 Thoracentesis  Procedure Note  KAILER HEINDEL  [id number]  1935/01/27  Date:05/12/24  Time:3:45 PM   Provider Performing:Sanay Belmar C Claudene   Procedure: Thoracentesis with imaging guidance (67444)  Indication(s) Pleural Effusion  Consent Risks of the procedure as well as the alternatives and risks of each were explained to the patient and/or caregiver.  Consent for the procedure was obtained and is signed in the bedside chart  Anesthesia Topical only with 1% lidocaine     Time Out Verified patient identification, verified procedure, site/side was marked, verified correct patient position, special equipment/implants available, medications/allergies/relevant history reviewed, required imaging and test results available.   Sterile Technique Maximal sterile technique including full sterile barrier drape, hand hygiene, sterile gown, sterile gloves, mask, hair covering, sterile ultrasound probe cover (if used).  Procedure Description Ultrasound was used to identify appropriate pleural anatomy for placement and overlying skin marked.  Area of drainage cleaned and draped in sterile fashion. Lidocaine  was used to anesthetize the skin and subcutaneous tissue.  1600 cc's of amber appearing fluid was drained from the right pleural space. Catheter then removed and bandaid applied to site.   Complications/Tolerance None; patient tolerated the procedure well. Chest X-ray is ordered to confirm no post-procedural complication.   EBL Minimal   Specimen(s) Pleural fluid

## 2024-05-12 NOTE — Progress Notes (Signed)
 On POCUS has L>R pleural effusions.  Will discuss moving forward with thora(s) w pt and son, Marinda Risks/benefits discussed  Consent obtained to proceed with thoracenteses     Ronnald Gave MSN, AGACNP-BC [facility] Pulmonary/Critical Care Medicine 05/12/2024, 3:22 PM

## 2024-05-12 NOTE — Procedures (Signed)
 Thoracentesis  Procedure Note  Jeremy Ferrell  [id number]  1935/03/29  Date:05/12/24  Time:3:44 PM   Provider Performing:Nimsi Males C Claudene   Procedure: Thoracentesis with imaging guidance (67444)  Indication(s) Pleural Effusion  Consent Risks of the procedure as well as the alternatives and risks of each were explained to the patient and/or caregiver.  Consent for the procedure was obtained and is signed in the bedside chart  Anesthesia Topical only with 1% lidocaine     Time Out Verified patient identification, verified procedure, site/side was marked, verified correct patient position, special equipment/implants available, medications/allergies/relevant history reviewed, required imaging and test results available.   Sterile Technique Maximal sterile technique including full sterile barrier drape, hand hygiene, sterile gown, sterile gloves, mask, hair covering, sterile ultrasound probe cover (if used).  Procedure Description Ultrasound was used to identify appropriate pleural anatomy for placement and overlying skin marked.  Area of drainage cleaned and draped in sterile fashion. Lidocaine  was used to anesthetize the skin and subcutaneous tissue.  700 cc's of amber appearing fluid was drained from the left pleural space. Catheter then removed and bandaid applied to site.   Complications/Tolerance None; patient tolerated the procedure well. Chest X-ray is ordered to confirm no post-procedural complication.   EBL Minimal   Specimen(s) Pleural fluid

## 2024-05-12 NOTE — Significant Event (Signed)
 Rapid Response Event Note   Reason for Call :  Respiratory distress  Initial Focused Assessment:  Patient is alert and oriented.  He is using accessory muscles to breath.  Lung sounds with rhonchi Heart tones irregular  BP 176/104  HR 140  RR 40s    Urinary catheter flushed with 30cc NS, did not meet resistance.  Minimal return of fluid. Bladder scan 0  Dr Cindy at bedside  Interventions:  Placed on Bipap, pt ripped it off because he could not tolerate Then placed on NRB   O2 sat 98%  RR 40s Lasix  IV Albumin  PCXR  Reassessment He is still using accessory muscles RR 40s. He states he is breathing better if he is completely upright. He did not tolerate lying back when being repositioned in bed.   CCM consulted Dr Claudene and Ronnald at bedside.   Plan of Care:     Event Summary:   MD Notified: Dr Cindy at bedside Call Time: 1320 Arrival Time: 1325 End Time:1500  Jeremy Portland, RN

## 2024-05-12 NOTE — Consult Note (Addendum)
 NAME:  Jeremy Ferrell, MRN:  [id number], DOB:  06-Aug-1935, LOS: 2 ADMISSION DATE:  05/10/2024, CONSULTATION DATE:  9/232/5 REFERRING MD:  Cindy, CHIEF COMPLAINT:  resp distress   History of Present Illness:  89 yo M PMH metastatic thyroid  cancer, l;ung nodules (possible met dz) HTN DM who presented to [facility] ED 9/21 w weakness, fall. Associated poor PO intake x1wk prior to presentation, and multiple falls at home lately. Felt to be very dehydrated and UA c/w UTI. CT H neck without traumatic injury. CXR noted to have bilat pleural effusions and low lung volumes Transferred from [facility] to [facility] for uro placement of foley after bladder outlet obstruction was encountered during attempted placement at [facility].   Admitted to [facility] 9/21 -- IVF, rocephin  On 9/22 night pt was hypotensive req IVF bolus and midodrine  On 9/23 he had worsening hypoxia, eventual respiratory distress. Intolerant of BiPAP. PCCM consulted in this setting   Pertinent  Medical History  Metastatic thyroid  cancer - head neck lymph nodes, possible lung mets   Significant Hospital Events: Including procedures, antibiotic start and stop dates in addition to other pertinent events   9/21 [facility] ED. UTI dehydration , pladder outlet obstruction. Txf to [facility]. Foley placed by uro. Admit [facility] 9/22 hypotensive improved w IVF midodrine  9/23 decompensating resp status. Txf to ICU   Interim History / Subjective:  Did not tolerate BiPAP   Objective    Blood pressure (!) 176/104, pulse (!) 44, temperature 98 F (36.7 C), temperature source Oral, resp. rate (!) 46, height 5' 11 (1.803 m), weight 60.8 kg, SpO2 98%.    Vent Mode: PSV;BIPAP FiO2 (%):  [40 %] 40 % PEEP:  [5 cmH20] 5 cmH20 Pressure Support:  [8 cmH20] 8 cmH20   Intake/Output Summary (Last 24 hours) at 05/12/2024 1456 Last data filed at 05/12/2024 1350 Gross per 24 hour  Intake 488 ml  Output 1160 ml  Net -672 ml   Filed Weights   05/10/24 0826  Weight: 60.8 kg     Examination: General: chronically and acutely ill M in some distress HENT:[place] dry appearing mm NRB in place Lungs: incr RR with + accessory use Cardiovascular: tachycardic  Extremities: no obvious acute joint deformity Skin: scattered ecchymosis  Neuro: awake alert following commands GU: foley with tea colored urine   Resolved problem list   Assessment and Plan   Respiratory distress Pulmonary edema Pleural effusion Lung nodules Hx tobacco use  Dysphagia  -cannot really r/o PNA but leukocytosis improving, current coverage likely ok for UTI. -endorses frequent aspiration  P -txf to ICU.  -STAT CXR  -trial of precedex  to facilitate bipap -is declines or does not tolerate, would proceed with intubation  -80 lasix  now. BMP this evening -- might need  K   -NPO  -ECHO, BNP  -nodules as below  -will broaden abx to include aspiration coverage. Has a PCN allergy.   Severe sepsis due to UTI  P -cefepime  flagyl    Metastatic thyroid  cancer s/p thyroidectomy, with mets to lymph nodes head/neck s/p resection (2023) & possible mets to lung  Lung nodules  Soft tissue mass L chest  Secondary hypothyroidism -PET scan these had some mild hypermetabolic activity  -unclear to me in review of outpt records if these were felt to be avid r/t radioactive iodine or if possibly malignant.  P -if he does get a thora, send cyto   -will need chest CT at some point.  -cont synthroid    Urinary retention  Bladder  outlet obstruction  P -continue foley -- per uro recs re-eval 7d after placement   Hx HTN  HLD P -hold home antihypertensives -getting lasix  as above  DM P -change to q4hr SSI   Unintentional weight loss (151 lbs in June, now 134 lbs)  Severe protein calorie malnutrition  -RDN consulted and recs appreciated  -in immediate sense, will remain NPO 2/2 resp status  Sacral pressure injury POA  -will place WOC consult   Frequent falls -need to explore more as he  improves from acute process.   Full code -confirmed w pt prior to ICU txf   Labs   CBC: Recent Labs  Lab 05/10/24 0855 05/10/24 2007 05/11/24 0210 05/12/24 0506  WBC 9.8 20.8* 20.2* 10.2  NEUTROABS 7.9*  --   --   --   HGB 15.6 15.0 13.0 12.3*  HCT 44.3 43.8 39.0 36.1*  MCV 96.5 97.8 100.3* 97.8  PLT 478* 481* 415* 353    Basic Metabolic Panel: Recent Labs  Lab 05/10/24 0855 05/10/24 2007 05/11/24 0210 05/12/24 0506  NA 141  --  141 141  K 4.3  --  3.6 3.6  CL 99  --  106 106  CO2 29  --  24 25  GLUCOSE 190*  --  128* 72  BUN 25*  --  36* 30*  CREATININE 0.85 2.22* 1.85* 0.98  CALCIUM  8.8*  --  7.5* 7.7*   GFR: Estimated Creatinine Clearance: 46.5 mL/min (by C-G formula based on SCr of 0.98 mg/dL). Recent Labs  Lab 05/10/24 0855 05/10/24 2007 05/11/24 0210 05/12/24 0506  WBC 9.8 20.8* 20.2* 10.2    Liver Function Tests: Recent Labs  Lab 05/10/24 0855 05/11/24 0210 05/12/24 0506  AST 28 17 17   ALT 19 12 13   ALKPHOS 69 54 49  BILITOT 1.1 1.0 0.7  PROT 6.3* 4.6* 4.6*  ALBUMIN  2.9* 2.0* 2.0*   No results for input(s): LIPASE, AMYLASE in the last 168 hours. No results for input(s): AMMONIA in the last 168 hours.  ABG No results found for: PHART, PCO2ART, PO2ART, HCO3, TCO2, ACIDBASEDEF, O2SAT   Coagulation Profile: No results for input(s): INR, PROTIME in the last 168 hours.  Cardiac Enzymes: No results for input(s): CKTOTAL, CKMB, CKMBINDEX, TROPONINI in the last 168 hours.  HbA1C: Hgb A1c MFr Bld  Date/Time Value Ref Range Status  05/10/2024 08:08 PM 5.9 (H) 4.8 - 5.6 % Final    Comment:    (NOTE) Diagnosis of Diabetes The following HbA1c ranges recommended by the American Diabetes Association (ADA) may be used as an aid in the diagnosis of diabetes mellitus.  Hemoglobin             Suggested A1C NGSP%              Diagnosis  <5.7                   Non Diabetic  5.7-6.4                 Pre-Diabetic  >6.4                   Diabetic  <7.0                   Glycemic control for                       adults with diabetes.    07/16/2022 01:18 PM 6.3 (H) 4.8 -  5.6 % Final    Comment:    (NOTE)         Prediabetes: 5.7 - 6.4         Diabetes: >6.4         Glycemic control for adults with diabetes: <7.0     CBG: Recent Labs  Lab 05/11/24 1650 05/11/24 2116 05/12/24 0849 05/12/24 0936 05/12/24 1116  GLUCAP 182* 203* 66* 107* 198*    Review of Systems:   Review of Systems  Constitutional:  Positive for malaise/fatigue.  Respiratory:  Positive for shortness of breath.   Genitourinary:  Positive for dysuria.  Musculoskeletal:  Positive for falls.     Past Medical History:  He,  has a past medical history of Cancer (HCC) (05/2022), Diabetes (HCC), and Hypertension.   Surgical History:   Past Surgical History:  Procedure Laterality Date   CATARACT EXTRACTION, BILATERAL     CHOLECYSTECTOMY     EYE SURGERY Right    Macular degenaration and hemmoraghe   RADICAL NECK DISSECTION Right 07/23/2022   Procedure: RIGHT PARTIAL NECK DISSECTION;  Surgeon: Jesus Oliphant, MD;  Location: [facility] OR;  Service: ENT;  Laterality: Right;   THYROID  CYST EXCISION     THYROIDECTOMY Bilateral 07/23/2022   Procedure: TOTAL THYROIDECTOMY;  Surgeon: Jesus Oliphant, MD;  Location: [facility] OR;  Service: ENT;  Laterality: Bilateral;     Social History:   reports that he has quit smoking. His smoking use included cigarettes. He has never used smokeless tobacco. He reports that he does not currently use alcohol . He reports that he does not use drugs.   Family History:  His family history is not on file.   Allergies Allergies  Allergen Reactions   Penicillins Hives     Home Medications  Prior to Admission medications   Medication Sig Start Date End Date Taking? Authorizing Provider  alendronate (FOSAMAX) 70 MG tablet Take 70 mg by mouth once a week.   Yes [provider]   cabergoline (DOSTINEX) 0.5 MG tablet Take 0.25 mg by mouth 2 (two) times a week. 06/05/23  Yes [provider]  FARXIGA  10 MG TABS tablet Take 10 mg by mouth daily. 04/19/22  Yes [provider]  insulin  glargine (LANTUS  SOLOSTAR) 100 UNIT/ML Solostar Pen Inject 28 Units into the skin at bedtime.   Yes [provider]  levothyroxine  (SYNTHROID ) 125 MCG tablet Take 250 mcg by mouth daily before breakfast.   Yes [provider]  lisinopril  (ZESTRIL ) 10 MG tablet Take 10 mg by mouth daily.   Yes [provider]  metFORMIN  (GLUCOPHAGE -XR) 500 MG 24 hr tablet Take 1,000 mg by mouth daily. 02/29/24  Yes [provider]  metoprolol  tartrate (LOPRESSOR ) 50 MG tablet Take 50 mg by mouth 2 (two) times daily.   Yes [provider]  simvastatin  (ZOCOR ) 20 MG tablet Take 20 mg by mouth at bedtime.   Yes [provider]  tamsulosin  (FLOMAX ) 0.4 MG CAPS capsule Take 0.4 mg by mouth daily.   Yes [provider]  ezetimibe  (ZETIA ) 10 MG tablet Take 10 mg by mouth at bedtime. Patient not taking: Reported on 05/11/2024    [provider]  LASIX  20 MG tablet 1 tablet Orally Once a day; Duration: 5 days Patient not taking: Reported on 05/11/2024 05/05/24   [provider]     Critical care time:         CRITICAL CARE Performed by: Ronnald FORBES Gave   Total critical care  time: 70 minutes  Critical care time was exclusive of separately billable procedures and treating other patients.  Critical care was necessary to treat or prevent imminent or life-threatening deterioration.  Critical care was time spent personally by me on the following activities: development of treatment plan with patient and/or surrogate as well as nursing, discussions with consultants, evaluation of patient's response to treatment, examination of patient, obtaining history from patient or surrogate, ordering and performing treatments and  interventions, ordering and review of laboratory studies, ordering and review of radiographic studies, pulse oximetry and re-evaluation of patient's condition.  Ronnald Gave MSN, AGACNP-BC [place] Pulmonary/Critical Care Medicine Amion for pager 05/12/2024, 2:56 PM

## 2024-05-12 NOTE — Progress Notes (Signed)
 Patient refused to be placed on Bipap. Patient at this time is not having s.o.b./or w.o.b.

## 2024-05-12 NOTE — Progress Notes (Signed)
 eLink Physician-Brief Progress Note Patient Name: Jeremy Ferrell DOB: Jan 29, 1935 MRN: [id number]   Date of Service  05/12/2024  HPI/Events of Note  Troponin 90 baseline ---> 120, EKG also shows sub-endocardial injury pattern, patient without complaints of chest pain.  eICU Interventions  Will start 81 mg of Aspirin  and continue to trend Troponin, echocardiogram is pending.        Teirra Carapia U Xabi Wittler 05/12/2024, 8:00 PM

## 2024-05-13 ENCOUNTER — Inpatient Hospital Stay (HOSPITAL_COMMUNITY)

## 2024-05-13 DIAGNOSIS — J9601 Acute respiratory failure with hypoxia: Secondary | ICD-10-CM

## 2024-05-13 DIAGNOSIS — I509 Heart failure, unspecified: Secondary | ICD-10-CM | POA: Diagnosis not present

## 2024-05-13 DIAGNOSIS — J810 Acute pulmonary edema: Secondary | ICD-10-CM

## 2024-05-13 DIAGNOSIS — A419 Sepsis, unspecified organism: Principal | ICD-10-CM

## 2024-05-13 DIAGNOSIS — N179 Acute kidney failure, unspecified: Secondary | ICD-10-CM

## 2024-05-13 DIAGNOSIS — N390 Urinary tract infection, site not specified: Secondary | ICD-10-CM

## 2024-05-13 LAB — BASIC METABOLIC PANEL WITH GFR
Anion gap: 16 — ABNORMAL HIGH (ref 5–15)
Anion gap: 18 — ABNORMAL HIGH (ref 5–15)
BUN: 47 mg/dL — ABNORMAL HIGH (ref 8–23)
BUN: 49 mg/dL — ABNORMAL HIGH (ref 8–23)
CO2: 22 mmol/L (ref 22–32)
CO2: 20 mmol/L — ABNORMAL LOW (ref 22–32)
Calcium: 8.5 mg/dL — ABNORMAL LOW (ref 8.9–10.3)
Calcium: 8.2 mg/dL — ABNORMAL LOW (ref 8.9–10.3)
Chloride: 104 mmol/L (ref 98–111)
Chloride: 104 mmol/L (ref 98–111)
Creatinine, Ser: 2.18 mg/dL — ABNORMAL HIGH (ref 0.61–1.24)
Creatinine, Ser: 2.05 mg/dL — ABNORMAL HIGH (ref 0.61–1.24)
GFR, Estimated: 29 mL/min — ABNORMAL LOW (ref >60)
GFR, Estimated: 31 mL/min — ABNORMAL LOW (ref >60)
Glucose, Bld: 107 mg/dL — ABNORMAL HIGH (ref 70–99)
Glucose, Bld: 212 mg/dL — ABNORMAL HIGH (ref 70–99)
Potassium: 4.1 mmol/L (ref 3.5–5.1)
Potassium: 3.9 mmol/L (ref 3.5–5.1)
Sodium: 142 mmol/L (ref 135–145)
Sodium: 142 mmol/L (ref 135–145)

## 2024-05-13 LAB — GLUCOSE, CAPILLARY
Glucose-Capillary: 113 mg/dL — ABNORMAL HIGH (ref 70–99)
Glucose-Capillary: 137 mg/dL — ABNORMAL HIGH (ref 70–99)
Glucose-Capillary: 170 mg/dL — ABNORMAL HIGH (ref 70–99)
Glucose-Capillary: 213 mg/dL — ABNORMAL HIGH (ref 70–99)
Glucose-Capillary: 73 mg/dL (ref 70–99)
Glucose-Capillary: 91 mg/dL (ref 70–99)
Glucose-Capillary: 94 mg/dL (ref 70–99)

## 2024-05-13 LAB — ECHOCARDIOGRAM COMPLETE
Area-P 1/2: 7.59 cm2
Calc EF: 66.6 %
Height: 71 in
S' Lateral: 2.86 cm
Single Plane A2C EF: 65.3 %
Single Plane A4C EF: 63.2 %
Weight: 2324.53 [oz_av]

## 2024-05-13 LAB — CBC
HCT: 38.2 % — ABNORMAL LOW (ref 39.0–52.0)
Hemoglobin: 13.3 g/dL (ref 13.0–17.0)
MCH: 34.1 pg — ABNORMAL HIGH (ref 26.0–34.0)
MCHC: 34.8 g/dL (ref 30.0–36.0)
MCV: 97.9 fL (ref 80.0–100.0)
Platelets: 336 K/uL (ref 150–400)
RBC: 3.9 MIL/uL — ABNORMAL LOW (ref 4.22–5.81)
RDW: 14.2 % (ref 11.5–15.5)
WBC: 11.9 K/uL — ABNORMAL HIGH (ref 4.0–10.5)
nRBC: 0 % (ref 0.0–0.2)

## 2024-05-13 LAB — MAGNESIUM: Magnesium: 1.9 mg/dL (ref 1.7–2.4)

## 2024-05-13 LAB — TRIGLYCERIDES, BODY FLUIDS: Triglycerides, Fluid: 17 mg/dL

## 2024-05-13 LAB — PHOSPHORUS: Phosphorus: 3 mg/dL (ref 2.5–4.6)

## 2024-05-13 MED ORDER — ADULT MULTIVITAMIN W/MINERALS CH
1.0000 | ORAL_TABLET | Freq: Every day | ORAL | Status: DC
Start: 2024-05-14 — End: 2024-05-15
  Administered 2024-05-15: 1
  Filled 2024-05-13 (×2): qty 1

## 2024-05-13 MED ORDER — SODIUM CHLORIDE 0.9 % IV SOLN
2.0000 g | INTRAVENOUS | Status: DC
  Administered 2024-05-14: 2 g via INTRAVENOUS
  Filled 2024-05-13: qty 12.5

## 2024-05-13 MED ORDER — OSMOLITE 1.2 CAL PO LIQD
1000.0000 mL | ORAL | Status: DC
  Filled 2024-05-13 (×4): qty 1000

## 2024-05-13 MED ORDER — THIAMINE MONONITRATE 100 MG PO TABS
100.0000 mg | ORAL_TABLET | Freq: Every day | ORAL | Status: DC
  Administered 2024-05-15: 100 mg
  Filled 2024-05-13 (×3): qty 1

## 2024-05-13 MED ORDER — JUVEN PO PACK
1.0000 | PACK | Freq: Two times a day (BID) | ORAL | Status: DC
  Filled 2024-05-13 (×2): qty 1

## 2024-05-13 MED ORDER — FREE WATER: 85.0000 mL | Status: DC

## 2024-05-13 MED ORDER — PROSOURCE TF20 ENFIT COMPATIBL EN LIQD
60.0000 mL | Freq: Every day | ENTERAL | Status: DC
  Filled 2024-05-13 (×2): qty 60

## 2024-05-13 NOTE — Consult Note (Addendum)
 NAME:  Jeremy Ferrell, MRN:  [id number], DOB:  June 12, 1935, LOS: 3 ADMISSION DATE:  05/10/2024, CONSULTATION DATE:  9/232/5 REFERRING MD:  Cindy, CHIEF COMPLAINT:  resp distress   History of Present Illness:  89 yo M PMH metastatic thyroid  cancer, l;ung nodules (possible met dz) HTN DM who presented to [facility] ED 9/21 w weakness, fall. Associated poor PO intake x1wk prior to presentation, and multiple falls at home lately. Felt to be very dehydrated and UA c/w UTI. CT H neck without traumatic injury. CXR noted to have bilat pleural effusions and low lung volumes Transferred from [facility] to [facility] for uro placement of foley after bladder outlet obstruction was encountered during attempted placement at [facility].   Admitted to [facility] 9/21 -- IVF, rocephin  On 9/22 night pt was hypotensive req IVF bolus and midodrine  On 9/23 he had worsening hypoxia, eventual respiratory distress. Intolerant of BiPAP. PCCM consulted in this setting   Pertinent  Medical History  Metastatic thyroid  cancer - head neck lymph nodes, possible lung mets   Significant Hospital Events: Including procedures, antibiotic start and stop dates in addition to other pertinent events   9/21 [facility] ED. UTI dehydration , pladder outlet obstruction. Txf to [facility]. Foley placed by uro. Admit [facility] 9/22 hypotensive improved w IVF midodrine  9/23 decompensating resp status. Txf to ICU. S/p bilateral thoracentesis  Interim History / Subjective:  S/p bilateral thoracentesis Declined BiPAP overnight O2 weaned to 2L this morning Weaned off precedex  this am Objective    Blood pressure 128/67, pulse 99, temperature 97.6 F (36.4 C), temperature source Oral, resp. rate (!) 24, height 5' 11 (1.803 m), weight 65.9 kg, SpO2 100%.    Vent Mode: PSV;BIPAP FiO2 (%):  [40 %-100 %] 100 % PEEP:  [5 cmH20] 5 cmH20 Pressure Support:  [8 cmH20] 8 cmH20   Intake/Output Summary (Last 24 hours) at 05/13/2024 0831 Last data filed at 05/13/2024 0700 Gross per 24 hour   Intake 781.11 ml  Output 2420 ml  Net -1638.89 ml   Filed Weights   05/10/24 0826 05/13/24 0416  Weight: 60.8 kg 65.9 kg   Physical Exam: General: Chronically ill-appearing, no acute distress HENT: [place], AT, OP clear, MMM Eyes: EOMI, no scleral icterus Respiratory: Intermittently tachypneic. Diminished to auscultation bilaterally.  No crackles, wheezing or rales Cardiovascular: RRR, -M/R/G, no JVD GI: BS+, soft, nontender Extremities:-Edema,-tenderness Neuro: AAO x4, CNII-XII grossly intact Skin: Scattered ecchymosis Psych: Normal mood, normal affect GU: Foley in place  BUN/Cr 47/2.18 Trop 120>80 WBC 11.9 mildly increased BNP 291  Resolved problem list   Assessment and Plan   Acute respiratory distress - resolved Acute hypoxemic respiratory failure 2/2 pulmonary edema and bilateral pleural effusions, aspiration pneumonia Sepsis secondary to aspiration pnemonia Lung nodules Hx tobacco use  Dysphagia  -cannot really r/o PNA but leukocytosis improving, current coverage likely ok for UTI. -endorses frequent aspiration  -S/p bilateral thoracentesis 9/23 c/w transudative. Cytology pending P -Wean O2 for goal >88% -Diurese as able. Hold today for AKI -Echo scheduled -Continue Cefepime  as monotherapy. Has a PCN allergy -DC flagyl  -Pulmonary toilet: mucinex , PRN nebs -nodules as below   Severe sepsis due to UTI  P -cefepime  -DC flagyl    Metastatic thyroid  cancer s/p thyroidectomy, with mets to lymph nodes head/neck s/p resection (2023) & possible mets to lung  Lung nodules  Soft tissue mass L chest  Secondary hypothyroidism -PET scan these had some mild hypermetabolic activity  -unclear to me in review of outpt records if these were felt  to be avid r/t radioactive iodine or if possibly malignant.  P -Cytology from bilateral pleural effusions pending -Reviewed CT chest 02/2023 with bilateral pulmonary nodules. Discussed repeat today however patient declined as he  does not want to lay down -cont synthroid    AKI 2/2 obstruction +/- recent diuresis -Contact Urology team for coude exchange -Hold diuresis for today -Monitor UOP/Cr  Urinary retention  Bladder outlet obstruction - currently clotting P -Continue coude placed by Urology -continue foley -- per uro recs re-eval 7d after placement   Hx HTN  HLD P -hold home antihypertensives -getting lasix  as above  DM P -change to q4hr SSI   Hypothyroidism -Levothyroxine  25 mcg daily  Unintentional weight loss (151 lbs in June, now 134 lbs)  Severe protein calorie malnutrition  -RDN consulted and recs appreciated  -in immediate sense, will remain NPO 2/2 resp status  Sacral pressure injury POA  -will place WOC consult   Frequent falls -need to explore more as he improves from acute process.   Full code -confirmed w pt prior to ICU txf  -Palliative care consult placed   Critical care time:     Care Time: 50 min OK to transfer to [facility]  Slater Staff, M.D. [facility] Pulmonary/Critical Care Medicine 05/13/2024 8:40 AM   See Amion for personal pager For hours between 7 PM to 7 AM, please call Elink for urgent questions

## 2024-05-13 NOTE — Progress Notes (Signed)
   05/13/24 2228  BiPAP/CPAP/SIPAP  Reason BIPAP/CPAP not in use Non-compliant  BiPAP/CPAP /SiPAP Vitals  Pulse Rate 91  Resp (!) 32  SpO2 99 %  Bilateral Breath Sounds Clear;Diminished  MEWS Score/Color  MEWS Score 2  MEWS Score Color Yellow

## 2024-05-13 NOTE — Progress Notes (Signed)
 Nutrition Follow-up  DOCUMENTATION CODES:   Severe malnutrition in context of chronic illness  INTERVENTION:  Monitor for SLP evaluation and recommendations Once Cortrak placed, recommend: Start Osmolite 1.2 at 46ml/hr and advance by 10ml q8h to goal rate of 38ml/hr ( per day) 60ml ProSource TF20 once daily Free water  flush 85ml q4h ( total) Provides 1808 kcal, 100g protein, free water  daily Monitor potassium, magnesium  and phosphorus daily x 4 occurrences, MD to replete as needed, as pt is at risk for refeeding syndrome given inadequate oral intake >/=3 days and severe malnutrition. Thiamine  100mg  x 7 days Provide Juven BID per tube to support wound healing MVI with minerals daily  NUTRITION DIAGNOSIS:  Severe Malnutrition related to chronic illness as evidenced by energy intake < 75% for > or equal to 1 month, moderate fat depletion, severe muscle depletion. - remains applicable  GOAL:  Patient will meet greater than or equal to 90% of their needs - unmet, addressing via initiation of nutrition support  MONITOR:  PO intake, Supplement acceptance  REASON FOR ASSESSMENT:  Consult Assessment of nutrition requirement/status, Poor PO  ASSESSMENT:  Pt admitted with c/o multiple falls, worsening confusion, and concern for bladder outlet obstruction, found to have sepsis with UTI and dehydration. PMH significant for metastatic thyroid  cancer, lung nodules, HTN and DM2.  9/21 admitted with UTI, dehydration 9/23 decompensated respiratory status, transfer to ICU; s/p bilateral thoracentesis; NPO 9/24 transfer to progressive care, plan for cortrak and enteral nutrition  No documented meal completions on file to review during admit. Noted to have report of very poor oral intake x1 week prior to admission.   Pt discussed in interdisciplinary rounds.  SLP consulted for evaluation of swallow safety.  Plan to place Cortrak for temporary nutrition support in setting of  NPO status, difficulty with secretion management and attempt to increase energy intake. Pt also noted to have severe malnutrition.   Spoke with pt at bedside. Flowsheet reflects pt is A/O x3.  Discussed with patient his wishes on tube feeds for temporary nutrition support. He is amenable to this plan as he states that he is not hungry and has not needs in several days.   Admit weight likely to be stated weight - 60.8 kg Current weight 65.9 kg Will monitor throughout admission and while on nutrition support.  Unfortunately, due to limited documented weight history over the last year, unable to determine the significance of weight decline.   Medications: SSI 0-9 units q4h, MVI, Juven  Labs:  BUN 47 Cr 2.18 Anion gap 16 GFR 29 CBG's 73-137 x24 hours  Diet Order:   Diet Order             Diet NPO time specified Except for: Ice Chips, Sips with Meds  Diet effective now                   EDUCATION NEEDS:  No education needs have been identified at this time  Skin:  Skin Assessment: Skin Integrity Issues: Skin Integrity Issues:: Stage II Stage II: bilateral buttocks  Last BM:  9/21  Height:  Ht Readings from Last 1 Encounters:  05/10/24 5' 11 (1.803 m)    Weight:  Wt Readings from Last 1 Encounters:  05/13/24 65.9 kg    Ideal Body Weight:  78.18 kg  BMI:  Body mass index is 20.26 kg/m.  Estimated Nutritional Needs:   Kcal:  1700-1900  Protein:  85-100g  Fluid:  >/=1.7L  Royce Maris, RDN, LDN Clinical  Nutrition See AMiON for contact information.

## 2024-05-13 NOTE — Progress Notes (Signed)
 Cortrak Tube Team Note:  Consult received to place a Cortrak feeding tube. Unsuccessful in placing tube, despite attempts by three members of the team. Discussed with RN and MD. RN to attempt bedside NGT placement.   Blair Deaner MS, RD, LDN Registered Dietitian Clinical Nutrition RD Inpatient Contact Info in Amion

## 2024-05-13 NOTE — Plan of Care (Signed)
  Problem: Metabolic: Goal: Ability to maintain appropriate glucose levels will improve Outcome: Progressing   Problem: Tissue Perfusion: Goal: Adequacy of tissue perfusion will improve Outcome: Progressing   Problem: Pain Managment: Goal: General experience of comfort will improve and/or be controlled Outcome: Progressing   Problem: Safety: Goal: Ability to remain free from injury will improve Outcome: Progressing   Problem: Fluid Volume: Goal: Hemodynamic stability will improve Outcome: Progressing   Problem: Clinical Measurements: Goal: Diagnostic test results will improve Outcome: Progressing   Problem: Respiratory: Goal: Ability to maintain adequate ventilation will improve Outcome: Progressing   Problem: Education: Goal: Ability to describe self-care measures that may prevent or decrease complications (Diabetes Survival Skills Education) will improve Outcome: Not Progressing Goal: Individualized Educational Video(s) Outcome: Not Progressing   Problem: Coping: Goal: Ability to adjust to condition or change in health will improve Outcome: Not Progressing   Problem: Fluid Volume: Goal: Ability to maintain a balanced intake and output will improve Outcome: Not Progressing   Problem: Health Behavior/Discharge Planning: Goal: Ability to identify and utilize available resources and services will improve Outcome: Not Progressing Goal: Ability to manage health-related needs will improve Outcome: Not Progressing   Problem: Nutritional: Goal: Maintenance of adequate nutrition will improve Outcome: Not Progressing Goal: Progress toward achieving an optimal weight will improve Outcome: Not Progressing   Problem: Skin Integrity: Goal: Risk for impaired skin integrity will decrease Outcome: Not Progressing   Problem: Education: Goal: Knowledge of General Education information will improve Description: Including pain rating scale, medication(s)/side effects and  non-pharmacologic comfort measures Outcome: Not Progressing   Problem: Health Behavior/Discharge Planning: Goal: Ability to manage health-related needs will improve Outcome: Not Progressing   Problem: Clinical Measurements: Goal: Ability to maintain clinical measurements within normal limits will improve Outcome: Not Progressing Goal: Will remain free from infection Outcome: Not Progressing Goal: Diagnostic test results will improve Outcome: Not Progressing Goal: Respiratory complications will improve Outcome: Not Progressing Goal: Cardiovascular complication will be avoided Outcome: Not Progressing   Problem: Activity: Goal: Risk for activity intolerance will decrease Outcome: Not Progressing   Problem: Nutrition: Goal: Adequate nutrition will be maintained Outcome: Not Progressing   Problem: Coping: Goal: Level of anxiety will decrease Outcome: Not Progressing   Problem: Elimination: Goal: Will not experience complications related to bowel motility Outcome: Not Progressing Goal: Will not experience complications related to urinary retention Outcome: Not Progressing   Problem: Skin Integrity: Goal: Risk for impaired skin integrity will decrease Outcome: Not Progressing   Problem: Clinical Measurements: Goal: Signs and symptoms of infection will decrease Outcome: Not Progressing

## 2024-05-13 NOTE — Plan of Care (Signed)
  Problem: Education: Goal: Ability to describe self-care measures that may prevent or decrease complications (Diabetes Survival Skills Education) will improve Outcome: Progressing Goal: Individualized Educational Video(s) Outcome: Progressing   Problem: Coping: Goal: Ability to adjust to condition or change in health will improve Outcome: Progressing   Problem: Fluid Volume: Goal: Ability to maintain a balanced intake and output will improve Outcome: Progressing   Problem: Health Behavior/Discharge Planning: Goal: Ability to identify and utilize available resources and services will improve Outcome: Progressing Goal: Ability to manage health-related needs will improve Outcome: Progressing   Problem: Metabolic: Goal: Ability to maintain appropriate glucose levels will improve Outcome: Progressing   Problem: Nutritional: Goal: Maintenance of adequate nutrition will improve Outcome: Progressing Goal: Progress toward achieving an optimal weight will improve Outcome: Progressing   Problem: Skin Integrity: Goal: Risk for impaired skin integrity will decrease Outcome: Progressing   Problem: Tissue Perfusion: Goal: Adequacy of tissue perfusion will improve Outcome: Progressing   Problem: Education: Goal: Knowledge of General Education information will improve Description: Including pain rating scale, medication(s)/side effects and non-pharmacologic comfort measures Outcome: Progressing   Problem: Health Behavior/Discharge Planning: Goal: Ability to manage health-related needs will improve Outcome: Progressing   Problem: Clinical Measurements: Goal: Ability to maintain clinical measurements within normal limits will improve Outcome: Progressing Goal: Will remain free from infection Outcome: Progressing Goal: Diagnostic test results will improve Outcome: Progressing Goal: Respiratory complications will improve Outcome: Progressing Goal: Cardiovascular complication will  be avoided Outcome: Progressing   Problem: Activity: Goal: Risk for activity intolerance will decrease Outcome: Progressing   Problem: Nutrition: Goal: Adequate nutrition will be maintained Outcome: Progressing   Problem: Coping: Goal: Level of anxiety will decrease Outcome: Progressing   Problem: Elimination: Goal: Will not experience complications related to bowel motility Outcome: Progressing Goal: Will not experience complications related to urinary retention Outcome: Progressing   Problem: Pain Managment: Goal: General experience of comfort will improve and/or be controlled Outcome: Progressing   Problem: Safety: Goal: Ability to remain free from injury will improve Outcome: Progressing   Problem: Skin Integrity: Goal: Risk for impaired skin integrity will decrease Outcome: Progressing   Problem: Fluid Volume: Goal: Hemodynamic stability will improve Outcome: Progressing   Problem: Clinical Measurements: Goal: Diagnostic test results will improve Outcome: Progressing Goal: Signs and symptoms of infection will decrease Outcome: Progressing   Problem: Respiratory: Goal: Ability to maintain adequate ventilation will improve Outcome: Progressing

## 2024-05-13 NOTE — Progress Notes (Addendum)
 Patient tachypneic, RR 40-50's and complaining of increased work of breathing. Sating 92-94% on RA but put back on 02 just for comfort. +accessory/abdl muscle use when breathing.   Foley cath flush w/ 40ml of water , urine started to drain, almost output @ 1330.   Dr. Kassie informed.

## 2024-05-13 NOTE — Progress Notes (Signed)
  Echocardiogram 2D Echocardiogram has been performed.  Jeremy Ferrell 05/13/2024, 11:42 AM

## 2024-05-13 NOTE — Progress Notes (Signed)
 PHARMACY NOTE:  ANTIMICROBIAL RENAL DOSAGE ADJUSTMENT  Current antimicrobial regimen includes a mismatch between antimicrobial dosage and estimated renal function.  As per policy approved by the Pharmacy & Therapeutics and Medical Executive Committees, the antimicrobial dosage will be adjusted accordingly.  Current antimicrobial dosage:  Cefepime  2g IV q12  Indication: UTI/Sepsis  Renal Function:  Estimated Creatinine Clearance: 22.7 mL/min (A) (by C-G formula based on SCr of 2.18 mg/dL (H)). []      On intermittent HD, scheduled: []      On CRRT    Antimicrobial dosage has been changed to:  Cefepime  2g IV q24  Additional comments:  Sergio Batch, PharmD, BCIDP, AAHIVP, CPP Infectious Disease Pharmacist 05/13/2024 8:04 AM

## 2024-05-13 NOTE — Progress Notes (Addendum)
 Clinical/Bedside Swallow Evaluation Patient Details  Name: Jeremy Ferrell MRN: [id number] Date of Birth: 1935-04-22  Today's Date: 05/13/2024 Time: SLP Start Time (ACUTE ONLY): 1607 SLP Stop Time (ACUTE ONLY): 1625 SLP Time Calculation (min) (ACUTE ONLY): 18 min  Past Medical History:  Past Medical History:  Diagnosis Date   Cancer (HCC) 05/2022   Thyroid  Cancer   Diabetes (HCC)    Lantus  98 units BID   Hypertension    Past Surgical History:  Past Surgical History:  Procedure Laterality Date   CATARACT EXTRACTION, BILATERAL     CHOLECYSTECTOMY     EYE SURGERY Right    Macular degenaration and hemmoraghe   RADICAL NECK DISSECTION Right 07/23/2022   Procedure: RIGHT PARTIAL NECK DISSECTION;  Surgeon: Jesus Oliphant, MD;  Location: [facility] OR;  Service: ENT;  Laterality: Right;   THYROID  CYST EXCISION     THYROIDECTOMY Bilateral 07/23/2022   Procedure: TOTAL THYROIDECTOMY;  Surgeon: Jesus Oliphant, MD;  Location: [facility] OR;  Service: ENT;  Laterality: Bilateral;   HPI:  Patient is an 89 y.o. male with PMH: thyroid  cancer (2023), DM, HTN,. He presented to the hospital on 05/10/24 with increased weakness, falls, worsening confusion and very poor PO intake x1 week. He presented to [facility] ED and was transferred to [facility] on 9/21. During evaluation, patient noted to have fallen multiple times resulting in a closed head injury. Patient with possible bladder outlet obstruction. He had worsening hypoxia on 9/23, eventually respiratory distress, intolerant of BiPAP. Cortrack placement unsuccessful. Plan for NG tube. SLP consulted for evaluation of swallow safety on 9/24    Assessment / Plan / Recommendation  Clinical Impression  Patient presents with clincal s/s of dysphagia as per this BSE. He was awake, alert, and participated fully in this evaluation. After oral care, SLP assessed patient's swallow via PO's of: thin liquids, and nectar thick liquids. Patient exhibited a delayed swallow and immediate cough  following PO intake. Throughout the session, patient's respiratory rate was frequently in the high 20s to low 30s. Patient's voice was weak and hoarse. Patient's cough was weak as well. SLP ordered MBS to further evaluate patient's swallow. SLP is recommending to continue NPO. SLP Visit Diagnosis: Dysphagia, unspecified (R13.10)    Aspiration Risk  Moderate aspiration risk    Diet Recommendation NPO    Medication Administration: Via alternative means Postural Changes: Seated upright at 90 degrees    Other  Recommendations Oral Care Recommendations: Oral care BID     Assistance Recommended at Discharge    Functional Status Assessment Patient has had a recent decline in their functional status and demonstrates the ability to make significant improvements in function in a reasonable and predictable amount of time.  Frequency and Duration min 1 x/week  1 week       Prognosis Prognosis for improved oropharyngeal function: Good      Swallow Study   General Date of Onset: 05/13/24 HPI: Patient is an 89 y.o. male with PMH: thyroid  cancer (2023), DM, HTN,. He presented to the hospital on 05/10/24 with increased weakness, falls, worsening confusion and very poor PO intake x1 week. He presented to [facility] ED and was transferred to [facility] on 9/21. During evaluation, patient noted to have fallen multiple times resulting in a closed head injury. Patient with possible bladder outlet obstruction. He had worsening hypoxia on 9/23, eventually respiratory distress, intolerant of BiPAP. Cortrack placement unsuccessful. Plan for NG tube. SLP consulted for evaluation of swallow safety on 9/24 Type of Study: Bedside  Swallow Evaluation Diet Prior to this Study: NPO Temperature Spikes Noted: No Respiratory Status: Nasal cannula History of Recent Intubation: No Behavior/Cognition: Alert;Cooperative;Pleasant mood Oral Cavity Assessment: Within Functional Limits Oral Care Completed by SLP: Yes Oral Cavity -  Dentition: Adequate natural dentition Patient Positioning: Upright in bed Baseline Vocal Quality: Hoarse;Low vocal intensity Volitional Cough: Weak;Congested    Oral/Motor/Sensory Function Overall Oral Motor/Sensory Function: Within functional limits   Ice Chips     Thin Liquid Thin Liquid: Impaired Presentation: Straw Pharyngeal  Phase Impairments: Suspected delayed Swallow;Cough - Immediate    Nectar Thick Nectar Thick Liquid: Impaired Presentation: Straw Pharyngeal Phase Impairments: Cough - Immediate   Honey Thick     Puree     Solid           Damien Hy  Graduate SLP Clinican

## 2024-05-14 ENCOUNTER — Inpatient Hospital Stay (HOSPITAL_COMMUNITY)

## 2024-05-14 DIAGNOSIS — J9811 Atelectasis: Secondary | ICD-10-CM | POA: Diagnosis not present

## 2024-05-14 DIAGNOSIS — Z66 Do not resuscitate: Secondary | ICD-10-CM

## 2024-05-14 DIAGNOSIS — J90 Pleural effusion, not elsewhere classified: Secondary | ICD-10-CM | POA: Diagnosis not present

## 2024-05-14 DIAGNOSIS — E43 Unspecified severe protein-calorie malnutrition: Secondary | ICD-10-CM | POA: Diagnosis not present

## 2024-05-14 DIAGNOSIS — R600 Localized edema: Secondary | ICD-10-CM | POA: Diagnosis not present

## 2024-05-14 DIAGNOSIS — Z515 Encounter for palliative care: Secondary | ICD-10-CM

## 2024-05-14 DIAGNOSIS — R339 Retention of urine, unspecified: Secondary | ICD-10-CM

## 2024-05-14 DIAGNOSIS — R0602 Shortness of breath: Secondary | ICD-10-CM | POA: Diagnosis not present

## 2024-05-14 DIAGNOSIS — R0989 Other specified symptoms and signs involving the circulatory and respiratory systems: Secondary | ICD-10-CM | POA: Diagnosis not present

## 2024-05-14 DIAGNOSIS — E860 Dehydration: Secondary | ICD-10-CM | POA: Diagnosis not present

## 2024-05-14 LAB — BASIC METABOLIC PANEL WITH GFR
Anion gap: 18 — ABNORMAL HIGH (ref 5–15)
BUN: 30 mg/dL — ABNORMAL HIGH (ref 8–23)
CO2: 24 mmol/L (ref 22–32)
Calcium: 8.3 mg/dL — ABNORMAL LOW (ref 8.9–10.3)
Chloride: 105 mmol/L (ref 98–111)
Creatinine, Ser: 1.04 mg/dL (ref 0.61–1.24)
GFR, Estimated: >60 mL/min (ref >60)
Glucose, Bld: 140 mg/dL — ABNORMAL HIGH (ref 70–99)
Potassium: 3.4 mmol/L — ABNORMAL LOW (ref 3.5–5.1)
Sodium: 147 mmol/L — ABNORMAL HIGH (ref 135–145)

## 2024-05-14 LAB — CBC
HCT: 37.7 % — ABNORMAL LOW (ref 39.0–52.0)
Hemoglobin: 13.2 g/dL (ref 13.0–17.0)
MCH: 33.8 pg (ref 26.0–34.0)
MCHC: 35 g/dL (ref 30.0–36.0)
MCV: 96.4 fL (ref 80.0–100.0)
Platelets: 439 K/uL — ABNORMAL HIGH (ref 150–400)
RBC: 3.91 MIL/uL — ABNORMAL LOW (ref 4.22–5.81)
RDW: 13.9 % (ref 11.5–15.5)
WBC: 11.7 K/uL — ABNORMAL HIGH (ref 4.0–10.5)
nRBC: 0 % (ref 0.0–0.2)

## 2024-05-14 LAB — GLUCOSE, CAPILLARY
Glucose-Capillary: 120 mg/dL — ABNORMAL HIGH (ref 70–99)
Glucose-Capillary: 148 mg/dL — ABNORMAL HIGH (ref 70–99)
Glucose-Capillary: 200 mg/dL — ABNORMAL HIGH (ref 70–99)
Glucose-Capillary: 199 mg/dL — ABNORMAL HIGH (ref 70–99)
Glucose-Capillary: 268 mg/dL — ABNORMAL HIGH (ref 70–99)

## 2024-05-14 LAB — MAGNESIUM: Magnesium: 1.6 mg/dL — ABNORMAL LOW (ref 1.7–2.4)

## 2024-05-14 LAB — BRAIN NATRIURETIC PEPTIDE: B Natriuretic Peptide: 182.8 pg/mL — ABNORMAL HIGH (ref 0.0–100.0)

## 2024-05-14 LAB — HEMOGLOBIN AND HEMATOCRIT, BLOOD
HCT: 34.5 % — ABNORMAL LOW (ref 39.0–52.0)
Hemoglobin: 11.9 g/dL — ABNORMAL LOW (ref 13.0–17.0)

## 2024-05-14 LAB — TYPE AND SCREEN
ABO/RH(D): A POS
Antibody Screen: NEGATIVE

## 2024-05-14 LAB — ABO/RH: ABO/RH(D): A POS

## 2024-05-14 LAB — PHOSPHORUS: Phosphorus: 1.8 mg/dL — ABNORMAL LOW (ref 2.5–4.6)

## 2024-05-14 MED ORDER — FUROSEMIDE 10 MG/ML IJ SOLN
40.0000 mg | Freq: Once | INTRAMUSCULAR | Status: AC
  Administered 2024-05-14: 40 mg via INTRAVENOUS
  Filled 2024-05-14: qty 4

## 2024-05-14 MED ORDER — SODIUM CHLORIDE 0.9 % IV SOLN
2.0000 g | Freq: Two times a day (BID) | INTRAVENOUS | Status: DC
  Administered 2024-05-14 – 2024-05-15 (×2): 2 g via INTRAVENOUS
  Filled 2024-05-14 (×2): qty 12.5

## 2024-05-14 MED ORDER — ALBUMIN HUMAN 25 % IV SOLN
12.5000 g | Freq: Once | INTRAVENOUS | Status: AC
  Administered 2024-05-14: 12.5 g via INTRAVENOUS
  Filled 2024-05-14: qty 50

## 2024-05-14 MED ORDER — SODIUM CHLORIDE 3 % IN NEBU: 4.0000 mL | INHALATION_SOLUTION | Freq: Two times a day (BID) | RESPIRATORY_TRACT | Status: DC | PRN

## 2024-05-14 MED ORDER — ENOXAPARIN SODIUM 40 MG/0.4ML IJ SOSY
40.0000 mg | PREFILLED_SYRINGE | INTRAMUSCULAR | Status: DC
  Administered 2024-05-14: 40 mg via SUBCUTANEOUS
  Filled 2024-05-14: qty 0.4

## 2024-05-14 MED ORDER — SODIUM PHOSPHATES 45 MMOLE/15ML IV SOLN
15.0000 mmol | Freq: Once | INTRAVENOUS | Status: AC
  Administered 2024-05-14: 15 mmol via INTRAVENOUS
  Filled 2024-05-14: qty 5

## 2024-05-14 MED ORDER — FINASTERIDE 5 MG PO TABS
5.0000 mg | ORAL_TABLET | Freq: Every day | ORAL | Status: DC
  Administered 2024-05-15: 5 mg via ORAL
  Filled 2024-05-14: qty 1

## 2024-05-14 MED ORDER — POTASSIUM CHLORIDE 20 MEQ PO PACK: 40.0000 meq | PACK | Freq: Once | ORAL | Status: DC

## 2024-05-14 MED ORDER — OXYCODONE HCL 5 MG PO TABS
5.0000 mg | ORAL_TABLET | Freq: Four times a day (QID) | ORAL | Status: DC | PRN
Start: 2024-05-14 — End: 2024-05-16
  Administered 2024-05-14: 5 mg via ORAL
  Filled 2024-05-14: qty 1

## 2024-05-14 MED ORDER — METOPROLOL TARTRATE 25 MG PO TABS: 25.0000 mg | ORAL_TABLET | Freq: Two times a day (BID) | ORAL | Status: DC

## 2024-05-14 MED ORDER — POTASSIUM CHLORIDE 10 MEQ/100ML IV SOLN
10.0000 meq | INTRAVENOUS | Status: AC
  Administered 2024-05-14 (×3): 10 meq via INTRAVENOUS
  Filled 2024-05-14 (×3): qty 100

## 2024-05-14 MED ORDER — MAGNESIUM SULFATE 2 GM/50ML IV SOLN
2.0000 g | Freq: Once | INTRAVENOUS | Status: AC
  Administered 2024-05-14: 2 g via INTRAVENOUS
  Filled 2024-05-14: qty 50

## 2024-05-14 MED ORDER — METOPROLOL TARTRATE 5 MG/5ML IV SOLN
5.0000 mg | Freq: Three times a day (TID) | INTRAVENOUS | Status: DC
  Administered 2024-05-14 – 2024-05-15 (×3): 5 mg via INTRAVENOUS
  Filled 2024-05-14 (×3): qty 5

## 2024-05-14 NOTE — Progress Notes (Signed)
 Nutrition Brief Note  Discussed patient during progression this morning. Cortrak team unable to place small bore feeding tube yesterday and RN unsuccessful with NGT placement at bedside. Patient down for MBS this morning where he refused NGT placement as well.   Palliative engaged and GOC meeting scheduled for 9AM tomorrow. Will remain NPO until then as he is unsafe for PO diet. Enteral nutrition support indicated, if aggressive care desired. Given inability to place small bore feeding tube and patient refusal for NGT placement, would recommend PEG tube. He would likely need long-term nutrition support to make any meaningful improvement.   Will follow along and modify care plan as GOC established.   INTERVENTION:  Monitor for ability to advance diet Pending GOC discussion and aggressive care desired, recommend enteral nutrition support via PEG:  Start Osmolite 1.2 at 77ml/hr and advance by 10ml q8h to goal rate of 31ml/hr ( per day) 60ml ProSource TF20 once daily Free water  flush 85ml q4h ( total) Provides 1808 kcal, 100g protein, free water  daily  Provide Juven BID per tube to support wound healing MVI with minerals daily Monitor potassium, magnesium  and phosphorus daily x 4 occurrences, MD to replete as needed, as pt is at risk for refeeding syndrome given inadequate oral intake >/=3 days and severe malnutrition. Thiamine  100mg  x 7 days   NUTRITION DIAGNOSIS:  Severe Malnutrition related to chronic illness as evidenced by energy intake < 75% for > or equal to 1 month, moderate fat depletion, severe muscle depletion. - remains applicable   GOAL:  Patient will meet greater than or equal to 90% of their needs - unmet, addressing via initiation of nutrition support   MONITOR:  PO intake, Supplement acceptance  Blair Deaner MS, RD, LDN Registered Dietitian Clinical Nutrition RD Inpatient Contact Info in Amion

## 2024-05-14 NOTE — Progress Notes (Signed)
 PROGRESS NOTE        PATIENT DETAILS Name: Jeremy Ferrell Age: 89 y.o. Sex: male Date of Birth: 07/04/1935 Admit Date: 05/10/2024 Admitting Physician Garnette MARLA Pelt, MD ERE:Hnoipwh, Norleen, MD  Brief Summary: Patient is a 89 y.o.  male with history of DM, HTN, metastatic thyroid  cancer-who presented to [facility] ED on 9/21 with weakness/falls/confusion-he was found to have acute urinary retention-Foley catheter insertion was unsuccessful, patient was then transferred to [facility] ED-where urology placed a coud catheter on the cystoscopy guidance.  He was then admitted to [facility] service-post admission-patient developed tachypnea/hypotension-he was thought to be developing urosepsis-he was then started on IV antibiotics.  Subsequently-on 9/23-developed right petri distress-patient was found to have pulmonary edema/pleural effusion-was transferred to the ICU-underwent thoracocentesis (transudative)-was given diuretics-stabilized and transferred to [facility].  Significant events: 9/21>> weakness/fall/urinary retention/confusion-unable to insert Foley at [facility] ED-transferred to [facility] ED-where urology placed Foley catheter under cystoscopy guidance.  Admit to [facility]. 9/22>> potential-thought to have urosepsis-started on Rocephin . 9/23>> worsening respiratory distress-transferred to ICU 9/25>> transferred back to [facility].  Significant studies: 9/21>> CXR: Vascular congestion/interstitial opacity-small bilateral pleural effusions. 9/21>> CT head: No acute intracranial abnormality 9/21 >>CT C-spine: No fracture 9/21>> x-ray left tibia/fibula: No bony findings 9/22>> CXR: Increased changes of congestive heart failure. 9/24>> echo: EF 70-75%  Significant microbiology data: 9/22>> urine culture: No growth 9/22>> blood culture: No growth 9/23>> pleural fluid culture: No growth  Procedures: 9/23>> thoracocentesis-bilateral-by PCCM  Consults: None  Subjective: Lying comfortably in bed-denies any  chest pain or shortness of breath.  Objective: Vitals: Blood pressure (!) 151/82, pulse (!) 118, temperature 97.9 F (36.6 C), temperature source Oral, resp. rate 20, height 5' 11 (1.803 m), weight 65.9 kg, SpO2 99%.   Exam: Gen Exam:Alert awake-not in any distress HEENT:atraumatic, normocephalic Chest: B/L clear to auscultation anteriorly CVS:S1S2 regular Abdomen:soft non tender, non distended Extremities:no edema Neurology: Non focal Skin: no rash  Pertinent Labs/Radiology:    Latest Ref Rng & Units 05/14/2024    4:00 AM 05/13/2024    4:46 AM 05/12/2024    5:06 AM  CBC  WBC 4.0 - 10.5 K/uL 11.7  11.9  10.2   Hemoglobin 13.0 - 17.0 g/dL 86.7  86.6  87.6   Hematocrit 39.0 - 52.0 % 37.7  38.2  36.1   Platelets 150 - 400 K/uL 439  336  353     Lab Results  Component Value Date   NA 147 (H) 05/14/2024   K 3.4 (L) 05/14/2024   CL 105 05/14/2024   CO2 24 05/14/2024      Assessment/Plan: Acute hypoxic respiratory failure secondary to acute pulm edema/HFpEF exacerbation and aspiration pneumonia Remains slightly tachypneic-Some transmitted upper airway sounds Needs mobilization/pulmonary toileting IV Lasix  x 1 dose today On antibiotics SLP following-remains n.p.o.-see below.  Septic shock secondary to UTI/possible aspiration pneumonia Afebrile SLP following-remains n.p.o.  Acute urinary retention Foley catheter inserted with difficulty by urology under cystoscopy guidance Continue Flomax  Add finasteride  Voiding trial as outpatient-at urology office.  AKI Suspect multifactorial-urine retention/hemodynamic mediated kidney injury in the setting of sepsis Creatinine has normalized.  Oropharyngeal dysphagia SLP following Remains n.p.o. Discussed with SLP-they tried to put a core track tube down while he was undergoing an MBS this am-patient refused.  HTN BP creeping up-resume metoprolol . Continue to hold lisinopril  for now.  HLD Statin  DM-2 CBG  stable SSI  Hypothyroidism Synthroid   Hypokalemia/hypomagnesemia/hypophosphatemia Replete/recheck  History of metastatic thyroid  cancer-s/p total thyroidectomy/neck dissection 2023 Follow-up with ENT/oncology/radiology as an outpatient.  ? Prolactinoma Appears to be on cabergoline-this will be resumed on discharge.  Frequent falls Secondary to acute illness superimposed on chronic debility PT/OT eval-SNF recommended.  Palliative care Full code Appears to have aspiration pneumonia-oropharyngeal dysphagia-refusing NG tube placement.  Palliative care consulted yesterday-awaiting evaluation.  Nutrition Status: Nutrition Problem: Severe Malnutrition Etiology: chronic illness Signs/Symptoms: energy intake < 75% for > or equal to 1 month, moderate fat depletion, severe muscle depletion Interventions: Tube feeding, MVI, Juven  Pressure Ulcer: Agree with assessment as outlined below. Wound 05/10/24 2100 Pressure Injury Buttocks Right Stage 2 -  Partial thickness loss of dermis presenting as a shallow open injury with a red, pink wound bed without slough. (Active)     Wound 05/10/24 2100 Pressure Injury Buttocks Left Stage 2 -  Partial thickness loss of dermis presenting as a shallow open injury with a red, pink wound bed without slough. (Active)   Code status:   Code Status: Full Code   DVT Prophylaxis: enoxaparin  (LOVENOX ) injection 40 mg Start: 05/14/24 1100 Place TED hose Start: 05/10/24 1358   Family Communication: Son-Mark [phone] left voicemail 9/25   Disposition Plan: Status is: Inpatient Remains inpatient appropriate because: Severity of illness   Planned Discharge Destination:Skilled nursing facility   Diet: Diet Order             Diet NPO time specified  Diet effective now                     Antimicrobial agents: Anti-infectives (From admission, onward)    Start     Dose/Rate Route Frequency Ordered Stop   05/14/24 1600  ceFEPIme   (MAXIPIME ) 2 g in sodium chloride  0.9 % 100 mL IVPB        2 g 200 mL/hr over 30 Minutes Intravenous Every 12 hours 05/14/24 0950     05/14/24 0400  ceFEPIme  (MAXIPIME ) 2 g in sodium chloride  0.9 % 100 mL IVPB  Status:  Discontinued        2 g 200 mL/hr over 30 Minutes Intravenous Every 24 hours 05/13/24 0759 05/14/24 0950   05/12/24 1600  metroNIDAZOLE  (FLAGYL ) IVPB 500 mg  Status:  Discontinued        500 mg 100 mL/hr over 60 Minutes Intravenous Every 12 hours 05/12/24 1514 05/13/24 0849   05/12/24 1600  ceFEPIme  (MAXIPIME ) 2 g in sodium chloride  0.9 % 100 mL IVPB  Status:  Discontinued        2 g 200 mL/hr over 30 Minutes Intravenous Every 12 hours 05/12/24 1514 05/13/24 0759   05/11/24 1700  cefTRIAXone  (ROCEPHIN ) 2 g in sodium chloride  0.9 % 100 mL IVPB  Status:  Discontinued        2 g 200 mL/hr over 30 Minutes Intravenous Every 24 hours 05/11/24 0249 05/12/24 1508   05/10/24 1645  cefTRIAXone  (ROCEPHIN ) 2 g in sodium chloride  0.9 % 100 mL IVPB        2 g 200 mL/hr over 30 Minutes Intravenous  Once 05/10/24 1643 05/10/24 1802        MEDICATIONS: Scheduled Meds:  artificial tears  1 drop Both Eyes BID   aspirin  EC  81 mg Oral Daily   Chlorhexidine  Gluconate Cloth  6 each Topical Daily   enoxaparin  (LOVENOX ) injection  40 mg Subcutaneous Q24H   ezetimibe   10 mg Oral QHS  feeding supplement (PROSource TF20)  60 mL Per Tube Daily   finasteride   5 mg Oral Daily   free water   85 mL Per Tube Q4H   furosemide   40 mg Intravenous Once   insulin  aspart  0-9 Units Subcutaneous Q4H   levothyroxine   25 mcg Oral Q0600   metoprolol  tartrate  25 mg Oral BID   multivitamin with minerals  1 tablet Per Tube Daily   nutrition supplement (JUVEN)  1 packet Per Tube BID BM   simvastatin   20 mg Oral QHS   tamsulosin   0.4 mg Oral QHS   thiamine   100 mg Per Tube Daily   Continuous Infusions:  ceFEPime  (MAXIPIME ) IV     feeding supplement (OSMOLITE 1.2 CAL)     potassium chloride      sodium  chloride irrigation     sodium PHOSPHATE  IVPB (in mmol) 15 mmol (05/14/24 0637)   PRN Meds:.acetaminophen  **OR** acetaminophen , guaiFENesin -dextromethorphan , ipratropium-albuterol , ondansetron  (ZOFRAN ) IV, sodium chloride  HYPERTONIC   I have personally reviewed following labs and imaging studies  LABORATORY DATA: CBC: Recent Labs  Lab 05/10/24 0855 05/10/24 2007 05/11/24 0210 05/12/24 0506 05/13/24 0446 05/14/24 0400  WBC 9.8 20.8* 20.2* 10.2 11.9* 11.7*  NEUTROABS 7.9*  --   --   --   --   --   HGB 15.6 15.0 13.0 12.3* 13.3 13.2  HCT 44.3 43.8 39.0 36.1* 38.2* 37.7*  MCV 96.5 97.8 100.3* 97.8 97.9 96.4  PLT 478* 481* 415* 353 336 439*    Basic Metabolic Panel: Recent Labs  Lab 05/12/24 0506 05/12/24 1803 05/13/24 0446 05/13/24 1751 05/14/24 0400 05/14/24 0735  NA 141 142 142 142  --  147*  K 3.6 4.0 4.1 3.9  --  3.4*  CL 106 104 104 104  --  105  CO2 25 25 22  20*  --  24  GLUCOSE 72 106* 107* 212*  --  140*  BUN 30* 38* 47* 49*  --  30*  CREATININE 0.98 1.51* 2.18* 2.05*  --  1.04  CALCIUM  7.7* 8.6* 8.5* 8.2*  --  8.3*  MG  --   --   --  1.9 1.6*  --   PHOS  --   --   --  3.0 1.8*  --     GFR: Estimated Creatinine Clearance: 47.5 mL/min (by C-G formula based on SCr of 1.04 mg/dL).  Liver Function Tests: Recent Labs  Lab 05/10/24 0855 05/11/24 0210 05/12/24 0506  AST 28 17 17   ALT 19 12 13   ALKPHOS 69 54 49  BILITOT 1.1 1.0 0.7  PROT 6.3* 4.6* 4.6*  ALBUMIN  2.9* 2.0* 2.0*   No results for input(s): LIPASE, AMYLASE in the last 168 hours. No results for input(s): AMMONIA in the last 168 hours.  Coagulation Profile: No results for input(s): INR, PROTIME in the last 168 hours.  Cardiac Enzymes: No results for input(s): CKTOTAL, CKMB, CKMBINDEX, TROPONINI in the last 168 hours.  BNP (last 3 results) No results for input(s): PROBNP in the last 8760 hours.  Lipid Profile: No results for input(s): CHOL, HDL, LDLCALC,  TRIG, CHOLHDL, LDLDIRECT in the last 72 hours.  Thyroid  Function Tests: No results for input(s): TSH, T4TOTAL, FREET4, T3FREE, THYROIDAB in the last 72 hours.  Anemia Panel: No results for input(s): VITAMINB12, FOLATE, FERRITIN, TIBC, IRON, RETICCTPCT in the last 72 hours.  Urine analysis:    Component Value Date/Time   COLORURINE RED (A) 05/10/2024 1455   APPEARANCEUR TURBID (A) 05/10/2024 1455  LABSPEC 1.020 05/10/2024 1455   PHURINE 5.0 05/10/2024 1455   GLUCOSEU 150 (A) 05/10/2024 1455   HGBUR MODERATE (A) 05/10/2024 1455   BILIRUBINUR NEGATIVE 05/10/2024 1455   KETONESUR NEGATIVE 05/10/2024 1455   PROTEINUR 100 (A) 05/10/2024 1455   UROBILINOGEN 0.2 09/14/2010 1720   NITRITE POSITIVE (A) 05/10/2024 1455   LEUKOCYTESUR NEGATIVE 05/10/2024 1455    Sepsis Labs: Lactic Acid, Venous No results found for: LATICACIDVEN  MICROBIOLOGY: Recent Results (from the past 240 hours)  Urine Culture (for pregnant, neutropenic or urologic patients or patients with an indwelling urinary catheter)     Status: None   Collection Time: 05/11/24  2:50 AM   Specimen: Urine, Catheterized  Result Value Ref Range Status   Specimen Description URINE, CATHETERIZED  Final   Special Requests NONE  Final   Culture   Final    NO GROWTH Performed at [facility], [address] [place], [place] [address]    Report Status 05/12/2024 FINAL  Final  Culture, blood (x 2)     Status: None (Preliminary result)   Collection Time: 05/11/24  6:32 AM   Specimen: BLOOD LEFT ARM  Result Value Ref Range Status   Specimen Description BLOOD LEFT ARM  Final   Special Requests   Final    BOTTLES DRAWN AEROBIC AND ANAEROBIC Blood Culture results may not be optimal due to an inadequate volume of blood received in culture bottles   Culture   Final    NO GROWTH 3 DAYS Performed at [facility], [address], [place], [place] [address]    Report Status PENDING  Incomplete   Culture, blood (x 2)     Status: None (Preliminary result)   Collection Time: 05/11/24  7:34 AM   Specimen: BLOOD RIGHT HAND  Result Value Ref Range Status   Specimen Description BLOOD RIGHT HAND  Final   Special Requests   Final    BOTTLES DRAWN AEROBIC AND ANAEROBIC Blood Culture adequate volume   Culture   Final    NO GROWTH 3 DAYS Performed at [facility], [address] [place], [place] [address]    Report Status PENDING  Incomplete  MRSA Next Gen by PCR, Nasal     Status: None   Collection Time: 05/12/24  2:05 PM   Specimen: Pleural; Nasal Swab  Result Value Ref Range Status   MRSA by PCR Next Gen NOT DETECTED NOT DETECTED Final    Comment: (NOTE) The GeneXpert MRSA Assay (FDA approved for NASAL specimens only), is one component of a comprehensive MRSA colonization surveillance program. It is not intended to diagnose MRSA infection nor to guide or monitor treatment for MRSA infections. Test performance is not FDA approved in patients less than 84 years old. Performed at [facility], [address], [place], [place] [address]   Body fluid culture w Gram Stain     Status: None (Preliminary result)   Collection Time: 05/12/24  3:18 PM   Specimen: Pleural Fluid  Result Value Ref Range Status   Specimen Description PLEURAL  Final   Special Requests LEFT  Final   Gram Stain   Final    RARE WBC PRESENT, PREDOMINANTLY MONONUCLEAR NO ORGANISMS SEEN    Culture   Final    NO GROWTH 2 DAYS Performed at [facility], [address] [place], [place] [address]    Report Status PENDING  Incomplete  Body fluid culture w Gram Stain  Status: None (Preliminary result)   Collection Time: 05/12/24  3:43 PM   Specimen: Pleural Fluid  Result Value Ref Range Status   Specimen Description PLEURAL  Final   Special Requests RIGHT  Final   Gram Stain NO WBC SEEN NO ORGANISMS SEEN   Final   Culture   Final    NO GROWTH 2 DAYS Performed at [facility]  Lab, [address] [place], [place] [address]    Report Status PENDING  Incomplete    RADIOLOGY STUDIES/RESULTS: DG Chest Port 1V same Day Result Date: 05/14/2024 EXAM: 1 VIEW(S) XRAY OF THE CHEST 05/14/2024 07:01:25 AM COMPARISON: Comparison none slash 23 slash 25. CLINICAL HISTORY: SOB (shortness of breath) Sob,congested; rover. FINDINGS: LUNGS AND PLEURA: Unchanged asymmetric elevation of the right hemidiaphragm with persistent moderate right pleural effusion and small left pleural effusion. Atelectasis noted within the perihilar left mid lung. Similar appearance of pulmonary vascular congestion. No pneumothorax. HEART AND MEDIASTINUM: Stable cardiac enlargement. Aortic atherosclerotic calcification. No acute abnormality of the mediastinal silhouette. BONES AND SOFT TISSUES: No acute osseous abnormality. IMPRESSION: 1. Persistent moderate right pleural effusion and small left pleural effusion. 2. Atelectasis within the perihilar left mid lung. 3. Similar appearance of pulmonary vascular congestion. Electronically signed by: Waddell Calk MD 05/14/2024 07:05 AM EDT RP Workstation: [id number]   ECHOCARDIOGRAM COMPLETE Result Date: 05/13/2024    ECHOCARDIOGRAM REPORT   Patient Name:   Jeremy Ferrell Date of Exam: 05/13/2024 Medical Rec #:  [id number]          Height:       71.0 in Accession #:    [id number]         Weight:       145.3 lb Date of Birth:  [birth year]          BSA:          1.841 m Patient Age:    88 years           BP:           128/67 mmHg Patient Gender: M                  HR:           117 bpm. Exam Location:  Inpatient Procedure: 2D Echo, Cardiac Doppler and Color Doppler (Both Spectral and Color            Flow Doppler were utilized during procedure). Indications:    I50.40* Unspecified combined systolic (congestive) and diastolic                 (congestive) heart failure  History:        Patient has no prior history of Echocardiogram examinations.  Sonographer:    Ellouise Mose RDCS  Referring Phys: [id number] STEPHEN K CHIU  Sonographer Comments: Technically difficult study due to poor echo windows. Very high parasternal window, very high lateral apical window. Patient uncomfortable, exam expedited. IMPRESSIONS  1. Left ventricular ejection fraction, by estimation, is 70 to 75%. Left ventricular ejection fraction by 2D MOD biplane is 66.6 %. Left ventricular ejection fraction by PLAX is 66 %. The left ventricle has hyperdynamic function. The left ventricle has no regional wall motion abnormalities. There is mild left ventricular hypertrophy of the basal-septal segment. Indeterminate diastolic filling due to E-A fusion.  2. Right ventricular systolic function is normal. The right ventricular size is normal. There is normal pulmonary artery systolic pressure. The estimated right ventricular systolic pressure is 34.8 mmHg.  3. Large pleural effusion.  4. The mitral valve is abnormal. Mild mitral valve regurgitation. No evidence of mitral stenosis. 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  5. The aortic valve is tricuspid. Aortic valve regurgitation is not visualized. No aortic stenosis is present.  6. The inferior vena cava is normal in size with greater than 50% respiratory variability, suggesting right atrial pressure of 3 mmHg. Comparison(s): No prior Echocardiogram. FINDINGS  Left Ventricle: Left ventricular ejection fraction, by estimation, is 70 to 75%. Left ventricular ejection fraction by PLAX is 66 %. Left ventricular ejection fraction by 2D MOD biplane is 66.6 %. The left ventricle has hyperdynamic function. The left ventricle has no regional wall motion abnormalities. The left ventricular internal cavity size was normal in size. There is mild left ventricular hypertrophy of the basal-septal segment. Indeterminate diastolic filling due to E-A fusion. Right Ventricle: The right ventricular size is normal. Right ventricular systolic function is normal. There is normal  pulmonary artery systolic pressure. The tricuspid regurgitant velocity is 2.82 m/s, and with an assumed right atrial pressure of 3 mmHg,  the estimated right ventricular systolic pressure is 34.8 mmHg. Left Atrium: Left atrial size was normal in size. Right Atrium: Right atrial size was normal in size. Pericardium: There is no evidence of pericardial effusion. Mitral Valve: The mitral valve is abnormal. 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 Mild mitral valve regurgitation. No evidence of mitral valve stenosis. Tricuspid Valve: The tricuspid valve is normal in structure. Tricuspid valve regurgitation is mild . No evidence of tricuspid stenosis. Aortic Valve: The aortic valve is tricuspid. Aortic valve regurgitation is not visualized. No aortic stenosis is present. Pulmonic Valve: The pulmonic valve was normal in structure. Pulmonic valve regurgitation is not visualized. No evidence of pulmonic stenosis. Aorta: The aortic root and ascending aorta are structurally normal, with no evidence of dilitation. Venous: The inferior vena cava is normal in size with greater than 50% respiratory variability, suggesting right atrial pressure of 3 mmHg. IAS/Shunts: The interatrial septum is aneurysmal. No atrial level shunt detected by color flow Doppler. Additional Comments: There is a large pleural effusion.  LEFT VENTRICLE PLAX 2D                        Biplane EF (MOD) LV EF:         Left            LV Biplane EF:   Left                ventricular                      ventricular                ejection                         ejection                fraction by                      fraction by                PLAX is 66                       2D MOD                %.  biplane is LVIDd:         4.47 cm                          66.6 %. LVIDs:         2.86 cm LV PW:         1.06 cm         Diastology LV IVS:        1.00 cm         LV e' medial:    21.10 cm/s LVOT diam:     2.37 cm          LV E/e' medial:  6.7 LV SV:         76              LV e' lateral:   32.90 cm/s LV SV Index:   41              LV E/e' lateral: 4.3 LVOT Area:     4.41 cm  LV Volumes (MOD) LV vol d, MOD    92.5 ml A2C: LV vol d, MOD    66.8 ml A4C: LV vol s, MOD    32.1 ml A2C: LV vol s, MOD    24.6 ml A4C: LV SV MOD A2C:   60.4 ml LV SV MOD A4C:   66.8 ml LV SV MOD BP:    56.1 ml RIGHT VENTRICLE             IVC RV S prime:     21.80 cm/s  IVC diam: 2.33 cm TAPSE (M-mode): 1.6 cm LEFT ATRIUM             Index        RIGHT ATRIUM          Index LA diam:        4.01 cm 2.18 cm/m   RA Area:     8.91 cm LA Vol (A2C):   23.2 ml 12.60 ml/m  RA Volume:   12.20 ml 6.63 ml/m LA Vol (A4C):   22.0 ml 11.95 ml/m LA Biplane Vol: 22.3 ml 12.11 ml/m  AORTIC VALVE LVOT Vmax:   121.00 cm/s LVOT Vmean:  81.800 cm/s LVOT VTI:    0.172 m  AORTA Ao Root diam: 3.85 cm Ao Asc diam:  3.55 cm MITRAL VALVE                TRICUSPID VALVE MV Area (PHT): 7.59 cm     TR Peak grad:   31.8 mmHg MV Decel Time: 100 msec     TR Vmax:        282.00 cm/s MV E velocity: 142.00 cm/s                             SHUNTS                             Systemic VTI:  0.17 m                             Systemic Diam: 2.37 cm Redell Shallow MD Electronically signed by Redell Shallow MD Signature Date/Time: 05/13/2024/1:58:52 PM    Final    DG Chest Port 1 View Result Date: 05/12/2024 CLINICAL DATA:  Status post bilateral thoracentesis. EXAM:  PORTABLE CHEST 1 VIEW COMPARISON:  Chest x-ray earlier, same date. FINDINGS: Interval reduction of bilateral pleural effusions. No postprocedural pneumothorax. Low lung volumes with vascular crowding and atelectasis. Stable borderline cardiac enlargement and calcification of the thoracic aorta. Stable left-sided pleural lipoma. IMPRESSION: Interval reduction of bilateral pleural effusions. No postprocedural pneumothorax. Electronically Signed   By: MYRTIS Stammer M.D.   On: 05/12/2024 16:26   DG CHEST PORT 1 VIEW Result  Date: 05/12/2024 CLINICAL DATA:  Respiratory failure. EXAM: PORTABLE CHEST 1 VIEW COMPARISON:  05/11/2024 FINDINGS: Stable heart size, extremely low bilateral lung volumes,, bibasilar atelectasis bilateral pleural effusions and bilateral pulmonary interstitial edema. No pneumothorax. IMPRESSION: Stable appearance of the chest with extremely low lung volumes, bibasilar atelectasis, bilateral pleural effusions and bilateral pulmonary interstitial edema. Electronically Signed   By: Marcey Moan M.D.   On: 05/12/2024 14:49     LOS: 4 days   Donalda Applebaum, MD  [facility]    To contact the attending provider between 7A-7P or the covering provider during after hours 7P-7A, please log into the web site [email or web address] and access using universal [place] password for that web site. If you do not have the password, please call the hospital operator.  05/14/2024, 10:14 AM

## 2024-05-14 NOTE — Progress Notes (Signed)
Patient refusing BiPAP at this time.

## 2024-05-14 NOTE — TOC Progression Note (Signed)
 Transition of Care [facility]) - Progression Note    Patient Details  Name: Jeremy Ferrell MRN: [id number] Date of Birth: Apr 30, 1935  [facility] of [facility]) CM/SW Contact  [name], LCSW Phone Number: 05/14/2024, 5:12 PM  Clinical Narrative:    CSW continuing to follow.    Expected Discharge Plan: Skilled Nursing Facility Barriers to Discharge: Continued Medical Work up               Expected Discharge Plan and Services In-house Referral: Clinical Social Work     Living arrangements for the past 2 months: Single Family Home                                       Social Drivers of Health (SDOH) Interventions SDOH Screenings   Food Insecurity: No Food Insecurity (05/10/2024)  Housing: Low Risk  (05/10/2024)  Transportation Needs: No Transportation Needs (05/10/2024)  Utilities: Not At Risk (05/10/2024)  Social Connections: Moderately Integrated (05/10/2024)  Tobacco Use: Medium Risk (05/10/2024)    Readmission Risk Interventions     No data to display

## 2024-05-14 NOTE — Care Management Important Message (Signed)
 Important Message  Patient Details  Name: Jeremy Ferrell MRN: [id number] Date of Birth: August 16, 1935   Important Message Given:  Yes - Medicare IM     Claretta Deed 05/14/2024, 3:26 PM

## 2024-05-14 NOTE — Plan of Care (Signed)
  Problem: Pain Managment: Goal: General experience of comfort will improve and/or be controlled Outcome: Progressing   Problem: Health Behavior/Discharge Planning: Goal: Ability to identify and utilize available resources and services will improve Outcome: Not Progressing Goal: Ability to manage health-related needs will improve Outcome: Not Progressing   Problem: Nutritional: Goal: Maintenance of adequate nutrition will improve Outcome: Not Progressing Goal: Progress toward achieving an optimal weight will improve Outcome: Not Progressing   Problem: Clinical Measurements: Goal: Ability to maintain clinical measurements within normal limits will improve Outcome: Not Progressing   Problem: Nutrition: Goal: Adequate nutrition will be maintained Outcome: Not Progressing   Problem: Safety: Goal: Ability to remain free from injury will improve Outcome: Not Progressing   Problem: Skin Integrity: Goal: Risk for impaired skin integrity will decrease Outcome: Not Progressing

## 2024-05-14 NOTE — Progress Notes (Signed)
 Physical Therapy Treatment Patient Details Name: Jeremy Ferrell MRN: [id number] DOB: 04-Feb-1935 Today's Date: 05/14/2024   History of Present Illness Pt is 89 year old presented to [facility] on 05/10/24 for falls, generalized weakness, and confusion. Pt with possible bladder outlet obstruction and transferred to [facility]. Foley placed. Pt developed hypotension. Thoracentesis 9/23 for pleural effusion. PMH - HTN, DM, thyroid  CA    PT Comments  Pt received in bed, has had respiratory issues and thoracentesis since last PT visit. Pt able to come to EOB with min A and tolerate unsupported sitting ~10 mins. HR fluctuated from 110's to 130's, SPO2 in 90's on 6L HFNC. Pt fatigued with all activity. Increased effort needed to come to standing and pt able to step to chair with min HHA but could not maintain standing due to fatigue. Patient will benefit from continued inpatient follow up therapy, <3 hours/day. PT will continue to follow.     If plan is discharge home, recommend the following: A little help with walking and/or transfers;A little help with bathing/dressing/bathroom;Assistance with cooking/housework;Assist for transportation;Direct supervision/assist for financial management;Direct supervision/assist for medications management;Help with stairs or ramp for entrance   Can travel by private vehicle     No  Equipment Recommendations  None recommended by PT    Recommendations for Other Services       Precautions / Restrictions Precautions Precautions: Fall Restrictions Weight Bearing Restrictions Per Provider Order: No     Mobility  Bed Mobility Overal bed mobility: Needs Assistance Bed Mobility: Supine to Sit     Supine to sit: Min assist, HOB elevated     General bed mobility comments: min HHA and use of rail to come to EOB    Transfers Overall transfer level: Needs assistance Equipment used: 2 person hand held assist Transfers: Sit to/from Stand, Bed to  chair/wheelchair/BSC Sit to Stand: Min assist, +2 physical assistance   Step pivot transfers: Min assist, +2 physical assistance       General transfer comment: assist to stabilize, pt on 6L HFNC with SPO2 in 90's but pt with labored breathing throughout session. HR variable from 110's to 130's at rest and with movement    Ambulation/Gait               General Gait Details: deferred due to respiratory status and HR   Stairs             Wheelchair Mobility     Tilt Bed    Modified Rankin (Stroke Patients Only)       Balance Overall balance assessment: Needs assistance Sitting-balance support: No upper extremity supported, Feet supported Sitting balance-Leahy Scale: Good     Standing balance support: No upper extremity supported, During functional activity Standing balance-Leahy Scale: Poor Standing balance comment: unsteady in standing                            Communication Communication Communication: Impaired Factors Affecting Communication: Hearing impaired  Cognition Arousal: Alert Behavior During Therapy: WFL for tasks assessed/performed                             Following commands: Impaired Following commands impaired: Follows one step commands with increased time, Only follows one step commands consistently    Cueing Cueing Techniques: Verbal cues, Tactile cues  Exercises      General Comments General comments (skin integrity, edema, etc.): pt agreeable  to staying in chair for only 20 mins, mobility tech going back to check on      Pertinent Vitals/Pain Pain Assessment Pain Assessment: No/denies pain    Home Living                          Prior Function            PT Goals (current goals can now be found in the care plan section) Acute Rehab PT Goals Patient Stated Goal: return home PT Goal Formulation: With patient Time For Goal Achievement: 05/25/24 Potential to Achieve Goals:  Fair Progress towards PT goals: Not progressing toward goals - comment (respiratory instability)    Frequency    Min 2X/week      PT Plan      Co-evaluation              AM-PAC PT 6 Clicks Mobility   Outcome Measure  Help needed turning from your back to your side while in a flat bed without using bedrails?: A Little Help needed moving from lying on your back to sitting on the side of a flat bed without using bedrails?: A Little Help needed moving to and from a bed to a chair (including a wheelchair)?: A Little Help needed standing up from a chair using your arms (e.g., wheelchair or bedside chair)?: A Lot Help needed to walk in hospital room?: Total Help needed climbing 3-5 steps with a railing? : Total 6 Click Score: 13    End of Session Equipment Utilized During Treatment: Gait belt;Oxygen Activity Tolerance: Patient limited by fatigue Patient left: in chair;with call bell/phone within reach;with chair alarm set Nurse Communication: Mobility status PT Visit Diagnosis: Unsteadiness on feet (R26.81);Other abnormalities of gait and mobility (R26.89);Muscle weakness (generalized) (M62.81);History of falling (Z91.81);Repeated falls (R29.6)     Time: 8899-8878 PT Time Calculation (min) (ACUTE ONLY): 21 min  Charges:    $Therapeutic Activity: 8-22 mins PT General Charges $$ ACUTE PT VISIT: 1 Visit                     Richerd Lipoma, PT  Acute Rehab Services Secure chat preferred Office [phone]    Richerd CROME Sequan Auxier 05/14/2024, 12:57 PM

## 2024-05-14 NOTE — Progress Notes (Signed)
 Patient passing bright red blood and clots per rectum. HR has increased to 130s-140 (sinus tachycardia) with stable BP. Plan to hold prophylactic Lovenox  and ASA, continue NPO, give albumin , type & screen, and follow serial H&H.

## 2024-05-14 NOTE — Progress Notes (Signed)
 Mobility Specialist Progress Note:   05/14/24 1140  Mobility  Activity Pivoted/transferred from chair to bed  Level of Assistance Minimal assist, patient does 75% or more  Assistive Device Other (Comment) (HHA)  Distance Ambulated (ft) 3 ft  Activity Response Tolerated fair  Mobility Referral Yes  Mobility visit 1 Mobility  Mobility Specialist Start Time (ACUTE ONLY) 1140  Mobility Specialist Stop Time (ACUTE ONLY) 1156  Mobility Specialist Time Calculation (min) (ACUTE ONLY) 16 min   Pt ready to get back in bed after ~25 min in chair. Required minA to stand and pivot to bed. HR up to 130s with transfer. SpO2 WFL. Back in bed with all needs met, alarm on.  Therisa Rana Mobility Specialist Please contact via SecureChat or  Rehab office at [phone]

## 2024-05-14 NOTE — Evaluation (Signed)
 Modified Barium Swallow Study  Patient Details  Name: Jeremy Ferrell MRN: [id number] Date of Birth: 1934/12/23  Today's Date: 05/14/2024  Modified Barium Swallow completed.  Full report located under Chart Review in the Imaging Section.  History of Present Illness Patient is an 89 y.o. male with PMH: thyroid  cancer (2023), DM, HTN. He presented to the hospital on 05/10/24 with increased weakness, falls, worsening confusion and very poor PO intake x1 week. He presented to [facility] ED and was transferred to [facility] on 9/21. During evaluation, patient noted to have fallen multiple times resulting in a closed head injury. Patient with possible bladder outlet obstruction. He had worsening hypoxia on 9/23, eventually respiratory distress, intolerant of BiPAP. Per PCCM note 9/23, cannot r/o PNA  and pt reported dysphagia. SLP consulted for evaluation of swallow safety on 9/24.   Clinical Impression Pt has an oropharyngeal dysphagia with suspected esophageal component as well. He has some disorganized lingual transit with a collection of lingual residue. Pharyngeally he has reduced hyolaryngeal movement, and base of tongue retraction, which contribute to incomplete airway protection and pharyngeal clearance. Laryngeal vestibule closure is incomplete and with lighter boluses, his epiglottis does not move at all. Aspiration occurs with thin liquids right before the height of the swallow. Penetration occurs with many other consistencies, including even purees, and often after the swallow from residue in the pyriform sinuses and valleculae. Penetration sometimes spills down below the vocal folds into the airway (PAS 7-8). He senses aspiration most of the time, but he is not always effective in ejecting aspirates. Sometimes, penetrates and aspirates that are initially cleared, spill back into the laryngeal vestibule again before the next swallow. A chin tuck shows the most potential for improved efficiency and safety when  used with solids and nectar thick liquids, but aspiration of nectar thick liquids still occurred when he took a larger sip. Pt reports trouble swallowing over the last several weeks, loss of weight, and recent PNA. Note that plan was for Cortrak placement after MBS but pt declined this, stating that he would rather eat and drink even acknowledging dysphagia and aspiration on this study. Considering this and his current respiratory status, discussed with MD who suggests maintaining NPO status pending palliative care conversation. Will leave NPO, except for a few pieces of ice after oral care with RN to help with use of swallowing musculature and secretion management.  Factors that may increase risk of adverse event in presence of aspiration Noe & Lianne 2021): Respiratory or GI disease;Frail or deconditioned;Weak cough  Swallow Evaluation Recommendations Recommendations: NPO;Ice chips PRN after oral care Medication Administration: Via alternative means Oral care recommendations: Oral care QID (4x/day);Oral care before ice chips/water  Caregiver Recommendations: Have oral suction available      Leita SAILOR., M.A. CCC-SLP Acute Rehabilitation Services Office: [phone]  Secure chat preferred  05/14/2024,11:26 AM

## 2024-05-14 NOTE — Progress Notes (Signed)
 Mobility Specialist Progress Note:   05/14/24 1500  Mobility  Activity Pivoted/transferred from bed to chair  Level of Assistance Minimal assist, patient does 75% or more  Assistive Device Other (Comment) (HHA)  Distance Ambulated (ft) 3 ft  Activity Response Tolerated well  Mobility Referral Yes  Mobility visit 1 Mobility  Mobility Specialist Start Time (ACUTE ONLY) 1500  Mobility Specialist Stop Time (ACUTE ONLY) 1515  Mobility Specialist Time Calculation (min) (ACUTE ONLY) 15 min   Pt bed alarm going off. Found pt at foot of bed, O2 off. When pt stood, bloot clots/blood found on bed. Transferred to chair for bed change. HR up to 140s. Pt c/o pain, RN gave medicine recently. Pt left with all needs met, NT in to change. RN notified.   Therisa Rana Mobility Specialist Please contact via SecureChat or  Rehab office at [phone]

## 2024-05-14 NOTE — Plan of Care (Signed)
   Problem: Fluid Volume: Goal: Ability to maintain a balanced intake and output will improve Outcome: Progressing   Problem: Health Behavior/Discharge Planning: Goal: Ability to manage health-related needs will improve Outcome: Progressing   Problem: Metabolic: Goal: Ability to maintain appropriate glucose levels will improve Outcome: Progressing   Problem: Nutritional: Goal: Maintenance of adequate nutrition will improve Outcome: Progressing

## 2024-05-14 NOTE — Consult Note (Signed)
 Palliative Care Consult Note                                  Date: 05/14/2024   Patient Name: Jeremy Ferrell  DOB:12-17-1934  FMW:[id number]  Age / Sex:89 y.o., male  PCP: Jeremy Rush, MD Referring Physician: Raenelle Donalda HERO, MD  Reason for Consultation: Establishing goals of care  Past Medical History:  Diagnosis Date   Cancer [facility]) 05/2022   Thyroid  Cancer   Diabetes (HCC)    Lantus  98 units BID   Hypertension      Assessment & Plan:   HPI/Patient Profile: 89 y.o. 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 Brief ICU transfer for hypotension requiring IVF bolus and midodrine . Patient found to have bilateral pleural effusion s/p thoracentesis with 2.2 L fluid removed. Concern for poor PO intake, aspiration and possible metastatic disease in the lungs with cytology pending. Palliative care consulted for goals of care conversation.   SUMMARY OF RECOMMENDATIONS   DNR - limited Continue current management, goals of care meeting with son on 9/26 at 1000  Code Status: DNR - Limited (DNR/DNI)  Prognosis:  < 6 months  Discharge Planning:  To Be Determined   Discussed with: Ghimire MD, Jeremy Ferrell, Tina NP, Jeremy Ferrell (son), patient  Subjective:   Reviewed medical records, received report from team, assessed the patient and then meet at the patient's bedside to discuss diagnosis, prognosis, GOC, EOL wishes disposition and options.  Before meeting with the patient/family, I spent time reviewing the chart notes including oncology notes, H&P, and SLP notes. I also reviewed vital signs, nursing flowsheets, medication administrations record, labs, and imaging.   I met with the patient and spoke on the phone with son  Jeremy Ferrell).   We meet to discuss diagnosis prognosis, GOC, EOL wishes, disposition and options. Concept of Palliative Care was introduced as specialized medical care for people and their families living with serious illness.  If focuses on providing relief from the symptoms and stress of a serious illness.  The goal is to improve quality of life for both the patient and the family. Values and goals of care important to patient and family were attempted to be elicited.  Created space and opportunity for patient  and family to explore thoughts and feelings regarding current medical situation   Natural trajectory and current clinical status were discussed. Questions and concerns addressed. Patient encouraged to call with questions or concerns.    Patient/Family Understanding of Illness: - Patient and family understands that given the patient's history of metastatic thyroid  cancer, poor PO intake and possible metastatic lung nodules, the prognosis may be poor - More concerning is the aspiration and the refusal for NG tube would perpetuate the decline  Baseline Status: - Patient has been living alone at home and was independent prior to admission  Today's Discussion: - Discussed with patient and family that the patient has had poor PO intake and with the possible metastatic lung nodules, the patient reports he would most likely not want to intervene if his cancer progressed that far, but would like more time to discuss with his sons - Discussed code status and patient wants DNR/DNI and son agrees with the patient - Scheduled meeting with son and  patient for further goals of care   Review of Systems  Constitutional:  Positive for appetite change and fatigue.  Respiratory:  Positive for shortness of breath.     Objective:   Primary Diagnoses: Present on Admission:  Dehydration   Vital Signs:  BP (!) 143/94   Pulse 91   Temp 98 F (36.7 C) (Oral)   Resp (!) 36   Ht 5' 11 (1.803 m)   Wt  65.8 kg   SpO2 98%   BMI 20.23 kg/m   Physical Exam Constitutional:      Comments: Cachectic  HENT:     Head: Normocephalic.     Nose: Nose normal.     Mouth/Throat:     Mouth: Mucous membranes are dry.  Eyes:     Extraocular Movements: Extraocular movements intact.  Pulmonary:     Breath sounds: Rhonchi and rales present.  Abdominal:     General: Abdomen is flat.  Neurological:     Mental Status: He is alert and oriented to person, place, and time.  Psychiatric:        Mood and Affect: Mood normal.     Palliative Assessment/Data: 70%   Thank you for allowing us  to participate in the care of Jeremy Ferrell PMT will continue to support holistically.  Billing based on MDM: High  Problems Addressed: One or more chronic illnesses with severe exacerbation, progression, or side effects of treatment.  Amount and/or Complexity of Data: Category 1:Review of prior external note(s) from each unique source and Assessment requiring an independent historian(s) and Category 3:Discussion of management or test interpretation with external physician/other qualified health care professional/appropriate source (not separately reported)  Risks: Decision not to resuscitate or to de-escalate care because of poor prognosis   Signed by: Jeremy Ferrell Palliative Medicine Team  Team Phone # [phone] (Nights/Weekends)  05/14/2024, 2:14 PM

## 2024-05-14 NOTE — Progress Notes (Signed)
 Patient placed on BiPAP by RT due to increased WOB.  Coarse Crackles, RR >30 , Sats stable at 98%.  Patient tolerating well.

## 2024-05-15 DIAGNOSIS — J90 Pleural effusion, not elsewhere classified: Secondary | ICD-10-CM

## 2024-05-15 DIAGNOSIS — Z7189 Other specified counseling: Secondary | ICD-10-CM

## 2024-05-15 DIAGNOSIS — R262 Difficulty in walking, not elsewhere classified: Secondary | ICD-10-CM | POA: Diagnosis not present

## 2024-05-15 DIAGNOSIS — E43 Unspecified severe protein-calorie malnutrition: Secondary | ICD-10-CM | POA: Diagnosis not present

## 2024-05-15 DIAGNOSIS — E860 Dehydration: Secondary | ICD-10-CM | POA: Diagnosis not present

## 2024-05-15 DIAGNOSIS — Z515 Encounter for palliative care: Secondary | ICD-10-CM

## 2024-05-15 DIAGNOSIS — R600 Localized edema: Secondary | ICD-10-CM | POA: Diagnosis not present

## 2024-05-15 DIAGNOSIS — R1310 Dysphagia, unspecified: Secondary | ICD-10-CM | POA: Diagnosis not present

## 2024-05-15 DIAGNOSIS — C799 Secondary malignant neoplasm of unspecified site: Secondary | ICD-10-CM

## 2024-05-15 DIAGNOSIS — R0603 Acute respiratory distress: Secondary | ICD-10-CM | POA: Diagnosis not present

## 2024-05-15 DIAGNOSIS — Z66 Do not resuscitate: Secondary | ICD-10-CM | POA: Diagnosis not present

## 2024-05-15 LAB — BASIC METABOLIC PANEL WITH GFR
Anion gap: 15 (ref 5–15)
BUN: 50 mg/dL — ABNORMAL HIGH (ref 8–23)
CO2: 26 mmol/L (ref 22–32)
Calcium: 7.9 mg/dL — ABNORMAL LOW (ref 8.9–10.3)
Chloride: 107 mmol/L (ref 98–111)
Creatinine, Ser: 1.15 mg/dL (ref 0.61–1.24)
GFR, Estimated: >60 mL/min (ref >60)
Glucose, Bld: 229 mg/dL — ABNORMAL HIGH (ref 70–99)
Potassium: 3.7 mmol/L (ref 3.5–5.1)
Sodium: 148 mmol/L — ABNORMAL HIGH (ref 135–145)

## 2024-05-15 LAB — HEMOGLOBIN AND HEMATOCRIT, BLOOD
HCT: 30.8 % — ABNORMAL LOW (ref 39.0–52.0)
Hemoglobin: 10.5 g/dL — ABNORMAL LOW (ref 13.0–17.0)

## 2024-05-15 LAB — CBC
HCT: 32.2 % — ABNORMAL LOW (ref 39.0–52.0)
Hemoglobin: 11 g/dL — ABNORMAL LOW (ref 13.0–17.0)
MCH: 33.4 pg (ref 26.0–34.0)
MCHC: 34.2 g/dL (ref 30.0–36.0)
MCV: 97.9 fL (ref 80.0–100.0)
Platelets: 431 K/uL — ABNORMAL HIGH (ref 150–400)
RBC: 3.29 MIL/uL — ABNORMAL LOW (ref 4.22–5.81)
RDW: 13.9 % (ref 11.5–15.5)
WBC: 9.5 K/uL (ref 4.0–10.5)
nRBC: 0 % (ref 0.0–0.2)

## 2024-05-15 LAB — GLUCOSE, CAPILLARY
Glucose-Capillary: 225 mg/dL — ABNORMAL HIGH (ref 70–99)
Glucose-Capillary: 210 mg/dL — ABNORMAL HIGH (ref 70–99)
Glucose-Capillary: 194 mg/dL — ABNORMAL HIGH (ref 70–99)

## 2024-05-15 LAB — MAGNESIUM: Magnesium: 2 mg/dL (ref 1.7–2.4)

## 2024-05-15 LAB — CYTOLOGY - NON PAP

## 2024-05-15 LAB — PHOSPHORUS: Phosphorus: 2.6 mg/dL (ref 2.5–4.6)

## 2024-05-15 MED ORDER — MORPHINE SULFATE (PF) 2 MG/ML IV SOLN: 1.0000 mg | INTRAVENOUS | Status: AC | PRN

## 2024-05-15 MED ORDER — POLYVINYL ALCOHOL 1.4 % OP SOLN: 1.0000 [drp] | Freq: Four times a day (QID) | OPHTHALMIC | Status: DC | PRN

## 2024-05-15 MED ORDER — OXYCODONE HCL 5 MG PO TABS: 5.0000 mg | ORAL_TABLET | Freq: Four times a day (QID) | ORAL | Status: AC | PRN

## 2024-05-15 MED ORDER — GLYCOPYRROLATE 0.2 MG/ML IJ SOLN: 0.2000 mg | INTRAMUSCULAR | Status: DC | PRN

## 2024-05-15 MED ORDER — ONDANSETRON HCL 4 MG/2ML IJ SOLN: 4.0000 mg | Freq: Four times a day (QID) | INTRAMUSCULAR | Status: AC | PRN

## 2024-05-15 MED ORDER — LORAZEPAM 2 MG/ML IJ SOLN: 0.5000 mg | INTRAMUSCULAR | Status: DC | PRN

## 2024-05-15 MED ORDER — IPRATROPIUM-ALBUTEROL 0.5-2.5 (3) MG/3ML IN SOLN: 3.0000 mL | RESPIRATORY_TRACT | Status: AC | PRN

## 2024-05-15 MED ORDER — BIOTENE DRY MOUTH MT LIQD
15.0000 mL | Freq: Three times a day (TID) | OROMUCOSAL | Status: DC
  Administered 2024-05-15 (×2): 15 mL via TOPICAL

## 2024-05-15 MED ORDER — POLYVINYL ALCOHOL 1.4 % OP SOLN: 1.0000 [drp] | Freq: Four times a day (QID) | OPHTHALMIC | Status: AC | PRN

## 2024-05-15 MED ORDER — GLYCOPYRROLATE 1 MG PO TABS: 1.0000 mg | ORAL_TABLET | ORAL | Status: DC | PRN

## 2024-05-15 MED ORDER — LORAZEPAM 2 MG/ML PO CONC: 0.5000 mg | ORAL | Status: DC | PRN

## 2024-05-15 MED ORDER — LORAZEPAM 2 MG/ML PO CONC: 0.5000 mg | ORAL | Status: AC | PRN

## 2024-05-15 MED ORDER — MORPHINE SULFATE (PF) 2 MG/ML IV SOLN
1.0000 mg | INTRAVENOUS | Status: DC | PRN
  Administered 2024-05-15 (×2): 1 mg via INTRAVENOUS
  Filled 2024-05-15 (×2): qty 1

## 2024-05-15 MED ORDER — GLYCOPYRROLATE 0.2 MG/ML IJ SOLN: 0.2000 mg | INTRAMUSCULAR | Status: AC | PRN

## 2024-05-15 NOTE — Plan of Care (Signed)

## 2024-05-15 NOTE — Plan of Care (Signed)
   Problem: Coping: Goal: Ability to adjust to condition or change in health will improve Outcome: Progressing   Problem: Fluid Volume: Goal: Ability to maintain a balanced intake and output will improve Outcome: Progressing

## 2024-05-15 NOTE — Progress Notes (Signed)
 Daily Progress Note   Patient Name: Jeremy Ferrell       Date: 05/15/2024 DOB: 1935-02-22  Age: 89 y.o. MRN#: [id number] Attending Physician: Raenelle Donalda HERO, MD Primary Care Physician: Marvine Rush, MD Admit Date: 05/10/2024  Reason for Consultation/Follow-up: Establishing goals of care  Subjective: Does not feel well but unable to elaborate much, does endorse shortness of breath  Length of Stay: 5  Current Medications: Scheduled Meds:   antiseptic oral rinse  15 mL Topical TID   artificial tears  1 drop Both Eyes BID    Continuous Infusions:  sodium chloride  irrigation      PRN Meds: acetaminophen  **OR** acetaminophen , artificial tears, glycopyrrolate  **OR** glycopyrrolate  **OR** glycopyrrolate , guaiFENesin -dextromethorphan , ipratropium-albuterol , LORazepam  **OR** LORazepam , morphine  injection, ondansetron  (ZOFRAN ) IV, oxyCODONE , sodium chloride  HYPERTONIC  Physical Exam Constitutional:      Appearance: He is ill-appearing.  Pulmonary:     Comments: tachypneic Skin:    General: Skin is warm and dry.  Neurological:     Mental Status: He is alert and oriented to person, place, and time.             Vital Signs: BP (!) 142/77 (BP Location: Right Arm)   Pulse (!) 107   Temp 98 F (36.7 C) (Oral)   Resp (!) 27   Ht 5' 11 (1.803 m)   Wt 65.8 kg   SpO2 95%   BMI 20.23 kg/m  SpO2: SpO2: 95 % O2 Device: O2 Device: Nasal Cannula O2 Flow Rate: O2 Flow Rate (L/min): 3 L/min  Intake/output summary:  Intake/Output Summary (Last 24 hours) at 05/15/2024 1359 Last data filed at 05/15/2024 1200 Gross per 24 hour  Intake 20 ml  Output 1250 ml  Net -1230 ml   LBM: Last BM Date : 05/14/24 Baseline Weight: Weight: 60.8 kg Most recent weight: Weight: 65.8 kg       Palliative  Assessment/Data: PPS 20%      Patient Active Problem List   Diagnosis Date Noted   Palliative care by specialist 05/14/2024   DNR (do not resuscitate) 05/14/2024   Pleural effusion 05/14/2024   Respiratory distress 05/12/2024   Pressure injury of skin 05/12/2024   Ambulatory dysfunction 05/12/2024   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 Brief ICU transfer for hypotension requiring IVF bolus and midodrine . Patient found to have bilateral pleural effusion s/p thoracentesis with 2.2 L fluid removed. Concern for poor PO intake, aspiration and possible metastatic disease in the lungs with cytology pending. Palliative care consulted for goals of care conversation.   Assessment: Meeting today at patient's bedside with his two sons.  We review current situation. Sons share that previously patient was independent needing only some assistance from sons.  They review that current situation is quite shocking  to them.  We discuss his respiratory status and feeling short of breath. Discuss ongoing concern of aspiration and that patient has been clear he would not want any feeding tube - not even temporary.  We review his metastatic cancer.  We discuss concern of poor prognosis given complicated situation and patient's frailty.  We review option to transition care from aggressive medical interventions to focusing solely on his comfort and avoiding medical interventions not needed to promote  comfort. Patient and sons all state they would like to focus on comfort. Sons share they are familiar with hospice facility in [facility] and would like for patient to go there, patient agrees.  Discussed medications used to promote comfort - specifically morphine , patient has not taken many pain meds in the past - will start low dose PRN.  Discussed allowing patient sips and bites for comfort - discussed ongoing risk of aspiration.  Recommendations/Plan: Transition to comfort measures only - dc measures not needed to promote comfort, add prn morphine  and prn ativan  Interested in hospice facility in [place] - [facility] aware Allow sips and bites of food as he tolerates and requests - small, slow eating, sit straight up - discussed with patient and family  Goals of Care and Additional Recommendations: Limitations on Scope of Treatment: Full Comfort Care  Code Status: DNR  Discharge Planning: Hospice facility  Care plan was discussed with patient and sons, TOC, Dr Raenelle, RN  Thank you for allowing the Palliative Medicine Team to assist in the care of this patient.   Total Time 60 minutes Prolonged Time Billed  no   Time spent includes: Detailed review of medical records (labs, imaging, vital signs), medically appropriate exam, discussion with treatment team, counseling and educating patient, family and/or staff, documenting clinical information, medication management and coordination of care.     *Please note that this is a verbal dictation therefore any spelling or grammatical errors are due to the [employer] system interpretation.  Tobey Jama Barnacle, DNP, [facility] Palliative Medicine Team Team Phone # [phone]  Pager [phone]

## 2024-05-15 NOTE — TOC Progression Note (Addendum)
 Transition of Care [facility]) - Progression Note    Patient Details  Name: Jeremy Ferrell MRN: [id number] Date of Birth: [birth year]  Transition of Care [facility] CM/SW Contact  [name], LCSW Phone Number: 05/15/2024, 10:29 AM  Clinical Narrative:    10:29am-CSW received request for Hospice Facility referral. Son reports preference for [facility]. CSW contacted [facility] and sent referral for [facility]. They will send out a nurse to evaluate patient today between 3pm-4pm.  5:06 PM-Per Duaine RN, patient accepted to [facility] today and son has signed paperwork. CSW will arrange transport.   Expected Discharge Plan: Hospice Medical Facility Barriers to Discharge: Hospice Bed not available               Expected Discharge Plan and Services In-house Referral: Clinical Social Work   Post Acute Care Choice: Hospice Living arrangements for the past 2 months: Single Family Home                                       Social Drivers of Health (SDOH) Interventions SDOH Screenings   Food Insecurity: No Food Insecurity (05/10/2024)  Housing: Low Risk  (05/10/2024)  Transportation Needs: No Transportation Needs (05/10/2024)  Utilities: Not At Risk (05/10/2024)  Social Connections: Moderately Integrated (05/10/2024)  Tobacco Use: Medium Risk (05/10/2024)    Readmission Risk Interventions     No data to display

## 2024-05-15 NOTE — Discharge Summary (Addendum)
 PATIENT DETAILS Name: Jeremy Ferrell Age: 89 y.o. Sex: male Date of Birth: 10-30-1934 MRN: [id number]. Admitting Physician: Garnette MARLA Pelt, MD ERE:Hnoipwh, Norleen, MD  Admit Date: 05/10/2024 Discharge date: 05/15/2024  Recommendations for Outpatient Follow-up:  Optimize comfort measures  Admitted From:  Home  Disposition: Hospice care   Discharge Condition: poor  CODE STATUS:   Code Status: Do not attempt resuscitation (DNR) - Comfort care   Diet recommendation:  Diet Order             Diet regular Room service appropriate? Yes; Fluid consistency: Thin  Diet effective now           Diet general                    Brief Summary: Patient is a 89 y.o.  male with history of DM, HTN, metastatic thyroid  cancer-who presented to [facility] ED on 9/21 with weakness/falls/confusion-he was found to have acute urinary retention-Foley catheter insertion was unsuccessful, patient was then transferred to [facility] ED-where urology placed a coud catheter on the cystoscopy guidance.  He was then admitted to [facility] service-post admission-patient developed tachypnea/hypotension-he was thought to be developing urosepsis-he was then started on IV antibiotics.  Subsequently-on 9/23-developed right petri distress-patient was found to have pulmonary edema/pleural effusion-was transferred to the ICU-underwent thoracocentesis (transudative-however cytology positive for malignancy)-was given diuretics-stabilized and transferred to [facility].   Significant events: 9/21>> weakness/fall/urinary retention/confusion-unable to insert Foley at [facility] ED-transferred to [facility] ED-where urology placed Foley catheter under cystoscopy guidance.  Admit to [facility]. 9/22>> potential-thought to have urosepsis-started on Rocephin . 9/23>> worsening respiratory distress-transferred to ICU 9/25>> transferred back to [facility]-likely aspirating on 4-5 L of oxygen-transmitted upper airway sounds-evaluated by SLP-failed swallow eval-n.p.o.-palliative  consulted.  Made DNR.  Some intermittent episodes of hematochezia 9/26>> family meeting by palliative care-no feeding tube-full comfort care-residential hospice planned on discharge.   Significant studies: 9/21>> CXR: Vascular congestion/interstitial opacity-small bilateral pleural effusions. 9/21>> CT head: No acute intracranial abnormality 9/21 >>CT C-spine: No fracture 9/21>> x-ray left tibia/fibula: No bony findings 9/22>> CXR: Increased changes of congestive heart failure. 923>> pleural fluid cytology +ve for malignancy-metastatic papillary carcinoma of the thyroid  9/24>> echo: EF 70-75%   Significant microbiology data: 9/22>> urine culture: No growth 9/22>> blood culture: No growth 9/23>> pleural fluid culture: No growth   Procedures: 9/23>> thoracocentesis-bilateral-by PCCM   Consults: Palliative PCCM Urology  Brief Hospital Course: Acute hypoxic respiratory failure secondary to acute pulm edema/HFpEF exacerbation,aspiration pneumonia and malignant bilateral pleural effusion (cytology on 9/23 positive on pleural fluid) Changed-slightly tachypneic-anxious-continues to have significant transmitted upper airway sounds on lung exam.  Suspect that he is still overtly aspirating some of his secretions. Remains on 3-4 L of HFNC Palliative care met with family/patient 9/26-they have elected to transition to full comfort measures.   No further antibiotics/diuretics Awaiting residential hospice bed.   Septic shock secondary to UTI/possible aspiration pneumonia Afebrile Still with significant transmitted upper airway sounds-suspect that he is still aspirating his secretions Failed SLP eval on 9/5-high risk for aspiration Family met with palliative on 9/26-does not desire feeding tube-full comfort measures-okay for comfort feeds.  Bilateral pleural effusion-malignant Transudative but cytology positive for malignancy-likely metastatic carcinoma consistent with patient's known  papillary thyroid  carcinoma.   Acute urinary retention Foley catheter inserted with difficulty by urology under cystoscopy guidance Now comfort care-see above-stop Flomax /finasteride -keep Foley in place for comfort.   AKI Suspect multifactorial-urine retention/hemodynamic mediated kidney injury in the setting of sepsis Creatinine has normalized.  Hypernatremia Secondary to poor oral intake N.p.o. for the past several days due to severe oropharyngeal dysphagia. No feeding tube desired-allowing comfort feeds.   Oropharyngeal dysphagia See above No feeding tube-comfort feeds-full comfort measures.  Hematochezia Some intermittent episodes of hematochezia overnight Slight drop in hemoglobin Comfort care-no intervention planned No plans to check CBC.   HTN No longer on metoprolol -comfort care   HLD No longer on statin-comfort care   DM-2 No plans to check CBGs any further-comfort care No role for SSI   Hypothyroidism No longer on Synthroid -comfort care   Hypokalemia/hypomagnesemia/hypophosphatemia Repleted-with no plans to recheck.   History of metastatic thyroid  cancer-s/p total thyroidectomy/neck dissection 2023-with now mets to pleural cavity-cytology on pleural fluid positive.  See above. Comfort care planned   ? Prolactinoma Appears to be on cabergoline-no plans to resume on discharge-as comfort care.   Frequent falls Secondary to acute illness superimposed on chronic debility PT/OT eval-SNF recommended-but has been transition to comfort measures-awaiting residential hospice bed.   Palliative care See above Very poor prognosis-overtly aspirating-frail elderly gentleman-admitted with urosepsis-then developed hypoxia from CHF-aspiration pneumonia and bilateral malignant pleural effusion.  Unfortunately-continues to overtly aspirate-failed swallow eval-high risk for aspiration.  Palliative met with family-DNR-full comfort measures.  Residential hospice bed pending.    Nutrition Status: Nutrition Problem: Severe Malnutrition Etiology: chronic illness Signs/Symptoms: energy intake < 75% for > or equal to 1 month, moderate fat depletion, severe muscle depletion Interventions: Tube feeding, MVI, Juven   Pressure Ulcer: Agree with assessment as outlined below. Wound 05/10/24 2100 Pressure Injury Buttocks Right Stage 2 -  Partial thickness loss of dermis presenting as a shallow open injury with a red, pink wound bed without slough. (Active)     Wound 05/10/24 2100 Pressure Injury Buttocks Left Stage 2 -  Partial thickness loss of dermis presenting as a shallow open injury with a red, pink wound bed without slough. (Active)   Discharge Diagnoses:  Principal Problem:   Dehydration Active Problems:   Protein-calorie malnutrition, severe   Respiratory distress   Pressure injury of skin   Ambulatory dysfunction   Secondary hyperthyroidism   Peripheral edema   Palliative care by specialist   DNR (do not resuscitate)   Pleural effusion   Discharge Instructions:  Activity:  As tolerated with Full fall precautions use walker/cane & assistance as needed  Discharge Instructions     Diet general   Complete by: As directed    Discharge instructions   Complete by: As directed    Follow with Primary MD  Marvine Rush, MD in 1-2 weeks  Please get a complete blood count and chemistry panel checked by your Primary MD at your next visit, and again as instructed by your Primary MD.  Get Medicines reviewed and adjusted: Please take all your medications with you for your next visit with your Primary MD  Laboratory/radiological data: Please request your Primary MD to go over all hospital tests and procedure/radiological results at the follow up, please ask your Primary MD to get all Hospital records sent to his/her office.  In some cases, they will be blood work, cultures and biopsy results pending at the time of your discharge. Please request that your  primary care M.D. follows up on these results.  Also Note the following: If you experience worsening of your admission symptoms, develop shortness of breath, life threatening emergency, suicidal or homicidal thoughts you must seek medical attention immediately by calling 911 or calling your MD immediately  if symptoms less severe.  You must read complete instructions/literature along with all the possible adverse reactions/side effects for all the Medicines you take and that have been prescribed to you. Take any new Medicines after you have completely understood and accpet all the possible adverse reactions/side effects.   Do not drive when taking Pain medications or sleeping medications (Benzodaizepines)  Do not take more than prescribed Pain, Sleep and Anxiety Medications. It is not advisable to combine anxiety,sleep and pain medications without talking with your primary care practitioner  Special Instructions: If you have smoked or chewed Tobacco  in the last 2 yrs please stop smoking, stop any regular Alcohol   and or any Recreational drug use.  Wear Seat belts while driving.  Please note: You were cared for by a hospitalist during your hospital stay. Once you are discharged, your primary care physician will handle any further medical issues. Please note that NO REFILLS for any discharge medications will be authorized once you are discharged, as it is imperative that you return to your primary care physician (or establish a relationship with a primary care physician if you do not have one) for your post hospital discharge needs so that they can reassess your need for medications and monitor your lab values.   Discharge wound care:   Complete by: As directed    Bilateral buttock: Cleanse with Vashe #[id number], not rinse, pat dry the peri-wound skin. Apply a single layer of xeroform to the wound bed daily, cover with foam dressing. If the foam is not saturated or soiling, can stay for up to 3 days,  ok to lift, change the Xeroform and reapply.   Increase activity slowly   Complete by: As directed       Allergies as of 05/15/2024       Reactions   Penicillins Hives        Medication List     STOP taking these medications    alendronate 70 MG tablet Commonly known as: FOSAMAX   cabergoline 0.5 MG tablet Commonly known as: DOSTINEX   ezetimibe  10 MG tablet Commonly known as: ZETIA    Farxiga  10 MG Tabs tablet Generic drug: dapagliflozin  propanediol   Lantus  SoloStar 100 UNIT/ML Solostar Pen Generic drug: insulin  glargine   Lasix  20 MG tablet Generic drug: furosemide    levothyroxine  125 MCG tablet Commonly known as: SYNTHROID    lisinopril  10 MG tablet Commonly known as: ZESTRIL    metFORMIN  500 MG 24 hr tablet Commonly known as: GLUCOPHAGE -XR   metoprolol  tartrate 50 MG tablet Commonly known as: LOPRESSOR    simvastatin  20 MG tablet Commonly known as: ZOCOR    tamsulosin  0.4 MG Caps capsule Commonly known as: FLOMAX        TAKE these medications    artificial tears ophthalmic solution Place 1 drop into both eyes 4 (four) times daily as needed for dry eyes.   glycopyrrolate  0.2 MG/ML injection Commonly known as: ROBINUL  Inject 1 mL (0.2 mg total) into the skin every 4 (four) hours as needed (excessive secretions).   ipratropium-albuterol  0.5-2.5 (3) MG/3ML Soln Commonly known as: DUONEB Take 3 mLs by nebulization every 2 (two) hours as needed.   LORazepam  2 MG/ML concentrated solution Commonly known as: ATIVAN  Place 0.3 mLs (0.6 mg total) under the tongue every 4 (four) hours as needed for anxiety.   morphine  (PF) 2 MG/ML injection Inject 0.5 mLs (1 mg total) into the vein every hour as needed (tachypnea, resp distress).   ondansetron  4 MG/2ML Soln injection Commonly known as: ZOFRAN  Inject 2 mLs (4  mg total) into the vein every 6 (six) hours as needed for nausea or vomiting.   oxyCODONE  5 MG immediate release tablet Commonly known as: Oxy  IR/ROXICODONE  Take 1 tablet (5 mg total) by mouth every 6 (six) hours as needed for moderate pain (pain score 4-6).               Discharge Care Instructions  (From admission, onward)           Start     Ordered   05/15/24 0000  Discharge wound care:       Comments: Bilateral buttock: Cleanse with Vashe #[id number], not rinse, pat dry the peri-wound skin. Apply a single layer of xeroform to the wound bed daily, cover with foam dressing. If the foam is not saturated or soiling, can stay for up to 3 days, ok to lift, change the Xeroform and reapply.   05/15/24 1124            Contact information for follow-up providers     [facility] [place]. Call.   Why: Make appt for 1 week, As needed Contact information: [address] [place]  [address] [phone]        Marvine Rush, MD. Schedule an appointment as soon as possible for a visit.   Specialty: Family Medicine Why: As needed Contact information: [address] [place] [address] [phone]              Contact information for after-discharge care     Destination     [facility] .   Service: Skilled Nursing Contact information: [address] [place]  [address] [phone]                    Allergies  Allergen Reactions   Penicillins Hives     Other Procedures/Studies: DG Swallowing Func-Speech Pathology Result Date: 05/14/2024 Table formatting from the original result was not included. Modified Barium Swallow Study Patient Details Name: Jeremy Ferrell MRN: [id number] Date of Birth: [birth year] Today's Date: 05/14/2024 HPI/PMH: HPI: Patient is an 89 y.o. male with PMH: thyroid  cancer (2023), DM, HTN. He presented to the hospital on 05/10/24 with increased weakness, falls, worsening confusion and very poor PO intake x1 week. He presented to [facility] ED and was transferred to [facility] on 9/21.  During evaluation, patient noted to have fallen multiple times resulting in a closed head injury. Patient with possible bladder outlet obstruction. He had worsening hypoxia on 9/23, eventually respiratory distress, intolerant of BiPAP. Per PCCM note 9/23, cannot r/o PNA  and pt reported dysphagia. SLP consulted for evaluation of swallow safety on 9/24. Clinical Impression: Pt has an oropharyngeal dysphagia with suspected esophageal component as well. He has some disorganized lingual transit with a collection of lingual residue. Pharyngeally he has reduced hyolaryngeal movement, and base of tongue retraction, which contribute to incomplete airway protection and pharyngeal clearance. Laryngeal vestibule closure is incomplete and with lighter boluses, his epiglottis does not move at all. Aspiration occurs with thin liquids right before the height of the swallow. Penetration occurs with many other consistencies, including even purees, and often after the swallow from residue in the pyriform sinuses and valleculae. Penetration sometimes spills down below the vocal folds into the airway (PAS 7-8). He senses aspiration most of the time, but he is not always effective in ejecting aspirates. Sometimes, penetrates and aspirates that are initially cleared, spill back into the laryngeal vestibule again before the  next swallow. A chin tuck shows the most potential for improved efficiency and safety when used with solids and nectar thick liquids, but aspiration of nectar thick liquids still occurred when he took a larger sip. Pt reports trouble swallowing over the last several weeks, loss of weight, and recent PNA. Note that plan was for Cortrak placement after MBS but pt declined this, stating that he would rather eat and drink even acknowledging dysphagia and aspiration on this study. Considering this and his current respiratory status, discussed with MD who suggests maintaining NPO status pending palliative care conversation.  Will leave NPO, except for a few pieces of ice after oral care with RN to help with use of swallowing musculature and secretion management. Factors that may increase risk of adverse event in presence of aspiration Noe & Lianne 2021): Factors that may increase risk of adverse event in presence of aspiration Noe & Lianne 2021): Respiratory or GI disease; Frail or deconditioned; Weak cough Recommendations/Plan: Swallowing Evaluation Recommendations Swallowing Evaluation Recommendations Recommendations: NPO; Ice chips PRN after oral care Medication Administration: Via alternative means Oral care recommendations: Oral care QID (4x/day); Oral care before ice chips/water  Caregiver Recommendations: Have oral suction available Treatment Plan Treatment Plan Treatment recommendations: Therapy as outlined in treatment plan below Follow-up recommendations: Skilled nursing-short term rehab (<3 hours/day) Functional status assessment: Patient has had a recent decline in their functional status and demonstrates the ability to make significant improvements in function in a reasonable and predictable amount of time. Treatment frequency: Min 2x/week Treatment duration: 2 weeks Interventions: Aspiration precaution training; Compensatory techniques; Patient/family education; Oropharyngeal exercises; Trials of upgraded texture/liquids; Respiratory muscle strength training Recommendations Recommendations for follow up therapy are one component of a multi-disciplinary discharge planning process, led by the attending physician.  Recommendations may be updated based on patient status, additional functional criteria and insurance authorization. Assessment: Orofacial Exam: Orofacial Exam Oral Cavity - Dentition: Adequate natural dentition Anatomy: Anatomy: WFL Boluses Administered: Boluses Administered Boluses Administered: Thin liquids (Level 0); Mildly thick liquids (Level 2, nectar thick); Moderately thick liquids (Level 3, honey  thick); Puree; Solid  Oral Impairment Domain: Oral Impairment Domain Lip Closure: No labial escape Tongue control during bolus hold: Cohesive bolus between tongue to palatal seal Bolus preparation/mastication: Timely and efficient chewing and mashing Bolus transport/lingual motion: Repetitive/disorganized tongue motion Oral residue: Residue collection on oral structures Location of oral residue : Tongue Initiation of pharyngeal swallow : Valleculae  Pharyngeal Impairment Domain: Pharyngeal Impairment Domain Soft palate elevation: No bolus between soft palate (SP)/pharyngeal wall (PW) Laryngeal elevation: Partial superior movement of thyroid  cartilage/partial approximation of arytenoids to epiglottic petiole Anterior hyoid excursion: Partial anterior movement Epiglottic movement: No inversion Laryngeal vestibule closure: Incomplete, narrow column air/contrast in laryngeal vestibule Pharyngeal stripping wave : Present - diminished Pharyngeal contraction (A/P view only): N/A Pharyngoesophageal segment opening: Partial distention/partial duration, partial obstruction of flow Tongue base retraction: Narrow column of contrast or air between tongue base and PPW Pharyngeal residue: Collection of residue within or on pharyngeal structures Location of pharyngeal residue: Valleculae; Pyriform sinuses; Aryepiglottic folds; Pharyngeal wall  Esophageal Impairment Domain: Esophageal Impairment Domain Esophageal clearance upright position: Esophageal retention Pill: Pill Consistency administered: -- (deferred) Penetration/Aspiration Scale Score: Penetration/Aspiration Scale Score 1.  Material does not enter airway: Solid 3.  Material enters airway, remains ABOVE vocal cords and not ejected out: Moderately thick liquids (Level 3, honey thick); Puree 4.  Material enters airway, CONTACTS cords then ejected out: Mildly thick liquids (Level 2, nectar thick) 7.  Material  enters airway, passes BELOW cords and not ejected out despite cough  attempt by patient: Thin liquids (Level 0) Compensatory Strategies: Compensatory Strategies Compensatory strategies: Yes Straw: Ineffective Ineffective Straw: Mildly thick liquid (Level 2, nectar thick) Effortful swallow: Ineffective Ineffective Effortful Swallow: Puree Chin tuck: Ineffective Ineffective Chin Tuck: Puree; Mildly thick liquid (Level 2, nectar thick)   General Information: Caregiver present: No  Diet Prior to this Study: NPO   Temperature : Normal   Respiratory Status: WFL   Supplemental O2: Nasal cannula   History of Recent Intubation: No  Behavior/Cognition: Alert; Cooperative; Pleasant mood Self-Feeding Abilities: Able to self-feed Baseline vocal quality/speech: Dysphonic Volitional Cough: Able to elicit Volitional Swallow: Able to elicit Exam Limitations: No limitations Goal Planning: Prognosis for improved oropharyngeal function: Good No data recorded No data recorded Patient/Family Stated Goal: Wants to drink water . Consulted and agree with results and recommendations: Patient; Physician; Nurse; Advanced practice provider (attending MD and palliative care) Pain: Pain Assessment Pain Assessment: Faces Faces Pain Scale: 0 End of Session: Start Time:SLP Start Time (ACUTE ONLY): 0854 Stop Time: SLP Stop Time (ACUTE ONLY): 0915 Time Calculation:SLP Time Calculation (min) (ACUTE ONLY): 21 min Charges: SLP Evaluations $ SLP Speech Visit: 1 Visit SLP Evaluations $BSS Swallow: 1 Procedure $MBS Swallow: 1 Procedure SLP visit diagnosis: SLP Visit Diagnosis: Dysphagia, oropharyngeal phase (R13.12) Past Medical History: Past Medical History: Diagnosis Date  Cancer (HCC) 05/2022  Thyroid  Cancer  Diabetes (HCC)   Lantus  98 units BID  Hypertension  Past Surgical History: Past Surgical History: Procedure Laterality Date  CATARACT EXTRACTION, BILATERAL    CHOLECYSTECTOMY    EYE SURGERY Right   Macular degenaration and hemmoraghe  RADICAL NECK DISSECTION Right 07/23/2022  Procedure: RIGHT PARTIAL NECK DISSECTION;   Surgeon: Jesus Oliphant, MD;  Location: [facility] OR;  Service: ENT;  Laterality: Right;  THYROID  CYST EXCISION    THYROIDECTOMY Bilateral 07/23/2022  Procedure: TOTAL THYROIDECTOMY;  Surgeon: Jesus Oliphant, MD;  Location: [facility] OR;  Service: ENT;  Laterality: Bilateral; Leita SAILOR., M.A. CCC-SLP Acute Rehabilitation Services Office: [phone] Secure chat preferred 05/14/2024, 11:31 AM  DG Chest Port 1V same Day Result Date: 05/14/2024 EXAM: 1 VIEW(S) XRAY OF THE CHEST 05/14/2024 07:01:25 AM COMPARISON: Comparison none slash 23 slash 25. CLINICAL HISTORY: SOB (shortness of breath) Sob,congested; rover. FINDINGS: LUNGS AND PLEURA: Unchanged asymmetric elevation of the right hemidiaphragm with persistent moderate right pleural effusion and small left pleural effusion. Atelectasis noted within the perihilar left mid lung. Similar appearance of pulmonary vascular congestion. No pneumothorax. HEART AND MEDIASTINUM: Stable cardiac enlargement. Aortic atherosclerotic calcification. No acute abnormality of the mediastinal silhouette. BONES AND SOFT TISSUES: No acute osseous abnormality. IMPRESSION: 1. Persistent moderate right pleural effusion and small left pleural effusion. 2. Atelectasis within the perihilar left mid lung. 3. Similar appearance of pulmonary vascular congestion. Electronically signed by: Waddell Calk MD 05/14/2024 07:05 AM EDT RP Workstation: [id number]   ECHOCARDIOGRAM COMPLETE Result Date: 05/13/2024    ECHOCARDIOGRAM REPORT   Patient Name:   Jeremy Ferrell Date of Exam: 05/13/2024 Medical Rec #:  [id number]          Height:       71.0 in Accession #:    [id number]         Weight:       145.3 lb Date of Birth:  [birth year]          BSA:          1.841 m Patient Age:    63 years  BP:           128/67 mmHg Patient Gender: M                  HR:           117 bpm. Exam Location:  Inpatient Procedure: 2D Echo, Cardiac Doppler and Color Doppler (Both Spectral and Color            Flow Doppler were  utilized during procedure). Indications:    I50.40* Unspecified combined systolic (congestive) and diastolic                 (congestive) heart failure  History:        Patient has no prior history of Echocardiogram examinations.  Sonographer:    Ellouise Mose RDCS Referring Phys: [id number] STEPHEN K CHIU  Sonographer Comments: Technically difficult study due to poor echo windows. Very high parasternal window, very high lateral apical window. Patient uncomfortable, exam expedited. IMPRESSIONS  1. Left ventricular ejection fraction, by estimation, is 70 to 75%. Left ventricular ejection fraction by 2D MOD biplane is 66.6 %. Left ventricular ejection fraction by PLAX is 66 %. The left ventricle has hyperdynamic function. The left ventricle has no regional wall motion abnormalities. There is mild left ventricular hypertrophy of the basal-septal segment. Indeterminate diastolic filling due to E-A fusion.  2. Right ventricular systolic function is normal. The right ventricular size is normal. There is normal pulmonary artery systolic pressure. The estimated right ventricular systolic pressure is 34.8 mmHg.  3. Large pleural effusion.  4. The mitral valve is abnormal. Mild mitral valve regurgitation. No evidence of mitral stenosis. 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  5. The aortic valve is tricuspid. Aortic valve regurgitation is not visualized. No aortic stenosis is present.  6. The inferior vena cava is normal in size with greater than 50% respiratory variability, suggesting right atrial pressure of 3 mmHg. Comparison(s): No prior Echocardiogram. FINDINGS  Left Ventricle: Left ventricular ejection fraction, by estimation, is 70 to 75%. Left ventricular ejection fraction by PLAX is 66 %. Left ventricular ejection fraction by 2D MOD biplane is 66.6 %. The left ventricle has hyperdynamic function. The left ventricle has no regional wall motion abnormalities. The left ventricular internal cavity size was  normal in size. There is mild left ventricular hypertrophy of the basal-septal segment. Indeterminate diastolic filling due to E-A fusion. Right Ventricle: The right ventricular size is normal. Right ventricular systolic function is normal. There is normal pulmonary artery systolic pressure. The tricuspid regurgitant velocity is 2.82 m/s, and with an assumed right atrial pressure of 3 mmHg,  the estimated right ventricular systolic pressure is 34.8 mmHg. Left Atrium: Left atrial size was normal in size. Right Atrium: Right atrial size was normal in size. Pericardium: There is no evidence of pericardial effusion. Mitral Valve: The mitral valve is abnormal. 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 Mild mitral valve regurgitation. No evidence of mitral valve stenosis. Tricuspid Valve: The tricuspid valve is normal in structure. Tricuspid valve regurgitation is mild . No evidence of tricuspid stenosis. Aortic Valve: The aortic valve is tricuspid. Aortic valve regurgitation is not visualized. No aortic stenosis is present. Pulmonic Valve: The pulmonic valve was normal in structure. Pulmonic valve regurgitation is not visualized. No evidence of pulmonic stenosis. Aorta: The aortic root and ascending aorta are structurally normal, with no evidence of dilitation. Venous: The inferior vena cava is normal in size with greater  than 50% respiratory variability, suggesting right atrial pressure of 3 mmHg. IAS/Shunts: The interatrial septum is aneurysmal. No atrial level shunt detected by color flow Doppler. Additional Comments: There is a large pleural effusion.  LEFT VENTRICLE PLAX 2D                        Biplane EF (MOD) LV EF:         Left            LV Biplane EF:   Left                ventricular                      ventricular                ejection                         ejection                fraction by                      fraction by                PLAX is 66                       2D MOD                 %.                               biplane is LVIDd:         4.47 cm                          66.6 %. LVIDs:         2.86 cm LV PW:         1.06 cm         Diastology LV IVS:        1.00 cm         LV e' medial:    21.10 cm/s LVOT diam:     2.37 cm         LV E/e' medial:  6.7 LV SV:         76              LV e' lateral:   32.90 cm/s LV SV Index:   41              LV E/e' lateral: 4.3 LVOT Area:     4.41 cm  LV Volumes (MOD) LV vol d, MOD    92.5 ml A2C: LV vol d, MOD    66.8 ml A4C: LV vol s, MOD    32.1 ml A2C: LV vol s, MOD    24.6 ml A4C: LV SV MOD A2C:   60.4 ml LV SV MOD A4C:   66.8 ml LV SV MOD BP:    56.1 ml RIGHT VENTRICLE             IVC RV S prime:     21.80 cm/s  IVC diam: 2.33 cm TAPSE (M-mode): 1.6 cm LEFT ATRIUM             Index  RIGHT ATRIUM          Index LA diam:        4.01 cm 2.18 cm/m   RA Area:     8.91 cm LA Vol (A2C):   23.2 ml 12.60 ml/m  RA Volume:   12.20 ml 6.63 ml/m LA Vol (A4C):   22.0 ml 11.95 ml/m LA Biplane Vol: 22.3 ml 12.11 ml/m  AORTIC VALVE LVOT Vmax:   121.00 cm/s LVOT Vmean:  81.800 cm/s LVOT VTI:    0.172 m  AORTA Ao Root diam: 3.85 cm Ao Asc diam:  3.55 cm MITRAL VALVE                TRICUSPID VALVE MV Area (PHT): 7.59 cm     TR Peak grad:   31.8 mmHg MV Decel Time: 100 msec     TR Vmax:        282.00 cm/s MV E velocity: 142.00 cm/s                             SHUNTS                             Systemic VTI:  0.17 m                             Systemic Diam: 2.37 cm Redell Shallow MD Electronically signed by Redell Shallow MD Signature Date/Time: 05/13/2024/1:58:52 PM    Final    DG Chest Port 1 View Result Date: 05/12/2024 CLINICAL DATA:  Status post bilateral thoracentesis. EXAM: PORTABLE CHEST 1 VIEW COMPARISON:  Chest x-ray earlier, same date. FINDINGS: Interval reduction of bilateral pleural effusions. No postprocedural pneumothorax. Low lung volumes with vascular crowding and atelectasis. Stable borderline cardiac enlargement and calcification  of the thoracic aorta. Stable left-sided pleural lipoma. IMPRESSION: Interval reduction of bilateral pleural effusions. No postprocedural pneumothorax. Electronically Signed   By: MYRTIS Stammer M.D.   On: 05/12/2024 16:26   DG CHEST PORT 1 VIEW Result Date: 05/12/2024 CLINICAL DATA:  Respiratory failure. EXAM: PORTABLE CHEST 1 VIEW COMPARISON:  05/11/2024 FINDINGS: Stable heart size, extremely low bilateral lung volumes,, bibasilar atelectasis bilateral pleural effusions and bilateral pulmonary interstitial edema. No pneumothorax. IMPRESSION: Stable appearance of the chest with extremely low lung volumes, bibasilar atelectasis, bilateral pleural effusions and bilateral pulmonary interstitial edema. Electronically Signed   By: Marcey Moan M.D.   On: 05/12/2024 14:49   DG CHEST PORT 1 VIEW Result Date: 05/11/2024 CLINICAL DATA:  Tachypnea and hypoxia following the intravenous administration of 1 L normal saline. Clinical concern for congestive heart failure. Follow-up possible interstitial pulmonary edema. EXAM: PORTABLE CHEST 1 VIEW COMPARISON:  05/10/2024 FINDINGS: Decreased depth of inspiration with small bilateral pleural effusions again demonstrated. Increased prominence of the pulmonary vasculature and interstitial markings with some Kerley lines. Mild diffuse peribronchial thickening. Borderline enlarged cardiac silhouette. Tortuous and partially calcified thoracic aorta. Unremarkable bones. Cholecystectomy clips. IMPRESSION: 1. Increased changes of congestive heart failure with small bilateral pleural effusions. 2. Mild diffuse bronchitic changes. Electronically Signed   By: Elspeth Bathe M.D.   On: 05/11/2024 14:04   DG Tibia/Fibula Left Port Result Date: 05/10/2024 CLINICAL DATA:  Left lower leg pain and swelling. Left knee abrasion. Post fall. EXAM: PORTABLE LEFT TIBIA AND FIBULA - 2 VIEW COMPARISON:  05/10/2024 at 11:19 a.m. FINDINGS: Mild tricompartmental  osteoarthritic change. Mild diffuse  decreased bone mineralization. Minimal diffuse soft tissue prominence over the ankle. Ankle mortise is normal. Small inferior calcaneal spur. Subtle degenerative change over the dorsal aspect of the talonavicular joint. IMPRESSION: 1. No acute findings. 2. Mild osteoarthritic change of the knee and ankle. Electronically Signed   By: Toribio Agreste M.D.   On: 05/10/2024 19:32   DG Tibia/Fibula Left Result Date: 05/10/2024 CLINICAL DATA:  Patient fell this morning.  Pain. EXAM: LEFT TIBIA AND FIBULA - 2 VIEW COMPARISON:  None Available. FINDINGS: No evidence for an acute fracture in the tibia or fibula. No worrisome lytic or sclerotic osseous abnormality. Degenerative changes are noted at the knee with meniscal calcification evident. IMPRESSION: 1. No acute bony findings. 2. Degenerative changes at the knee. Electronically Signed   By: Camellia Candle M.D.   On: 05/10/2024 11:50   CT Head Wo Contrast Result Date: 05/10/2024 CLINICAL DATA:  Polytrauma, blunt.  Fall. EXAM: CT HEAD WITHOUT CONTRAST CT CERVICAL SPINE WITHOUT CONTRAST TECHNIQUE: Multidetector CT imaging of the head and cervical spine was performed following the standard protocol without intravenous contrast. Multiplanar CT image reconstructions of the cervical spine were also generated. RADIATION DOSE REDUCTION: This exam was performed according to the departmental dose-optimization program which includes automated exposure control, adjustment of the mA and/or kV according to patient size and/or use of iterative reconstruction technique. COMPARISON:  MRI head 05/06/2023 FINDINGS: CT HEAD FINDINGS Brain: There is no evidence of an acute infarct, intracranial hemorrhage, mass, midline shift, or extra-axial fluid collection. There is mild cerebral atrophy. Cerebral white matter hypodensities are nonspecific but compatible with mild chronic small vessel ischemic disease. Vascular: Calcified atherosclerosis at the skull base. No hyperdense vessel. Skull: No  acute fracture or suspicious lesion. Sinuses/Orbits: Visualized paranasal sinuses and mastoid air cells are clear. Bilateral cataract extraction. Other: Left frontal scalp contusion. CT CERVICAL SPINE FINDINGS Alignment: Trace retrolisthesis of C4 on C5.  No acute fracture Skull base and vertebrae: No acute fracture or suspicious lesion. Mild atlantodental degenerative changes. Soft tissues and spinal canal: No prevertebral fluid or swelling. No visible canal hematoma. Disc levels: Multilevel disc degeneration, greatest at C4-5 where a broad-based posterior disc osteophyte complex result in likely mild spinal stenosis and severe right neural foraminal stenosis. Upper chest: Partially visualized moderately large right and small left pleural effusions. Other: None. IMPRESSION: 1. No evidence of acute intracranial abnormality or cervical spine fracture. 2. Left frontal scalp contusion. 3. Mild chronic small vessel ischemic disease. 4. Right larger than left pleural effusions. Electronically Signed   By: Dasie Hamburg M.D.   On: 05/10/2024 11:00   CT Cervical Spine Wo Contrast Result Date: 05/10/2024 CLINICAL DATA:  Polytrauma, blunt.  Fall. EXAM: CT HEAD WITHOUT CONTRAST CT CERVICAL SPINE WITHOUT CONTRAST TECHNIQUE: Multidetector CT imaging of the head and cervical spine was performed following the standard protocol without intravenous contrast. Multiplanar CT image reconstructions of the cervical spine were also generated. RADIATION DOSE REDUCTION: This exam was performed according to the departmental dose-optimization program which includes automated exposure control, adjustment of the mA and/or kV according to patient size and/or use of iterative reconstruction technique. COMPARISON:  MRI head 05/06/2023 FINDINGS: CT HEAD FINDINGS Brain: There is no evidence of an acute infarct, intracranial hemorrhage, mass, midline shift, or extra-axial fluid collection. There is mild cerebral atrophy. Cerebral white matter  hypodensities are nonspecific but compatible with mild chronic small vessel ischemic disease. Vascular: Calcified atherosclerosis at the skull base. No hyperdense vessel. Skull:  No acute fracture or suspicious lesion. Sinuses/Orbits: Visualized paranasal sinuses and mastoid air cells are clear. Bilateral cataract extraction. Other: Left frontal scalp contusion. CT CERVICAL SPINE FINDINGS Alignment: Trace retrolisthesis of C4 on C5.  No acute fracture Skull base and vertebrae: No acute fracture or suspicious lesion. Mild atlantodental degenerative changes. Soft tissues and spinal canal: No prevertebral fluid or swelling. No visible canal hematoma. Disc levels: Multilevel disc degeneration, greatest at C4-5 where a broad-based posterior disc osteophyte complex result in likely mild spinal stenosis and severe right neural foraminal stenosis. Upper chest: Partially visualized moderately large right and small left pleural effusions. Other: None. IMPRESSION: 1. No evidence of acute intracranial abnormality or cervical spine fracture. 2. Left frontal scalp contusion. 3. Mild chronic small vessel ischemic disease. 4. Right larger than left pleural effusions. Electronically Signed   By: Dasie Hamburg M.D.   On: 05/10/2024 11:00   DG Chest Portable 1 View Result Date: 05/10/2024 CLINICAL DATA:  Status post fall from standing. EXAM: PORTABLE CHEST 1 VIEW COMPARISON:  11/17/2013 FINDINGS: Low lung volumes. The cardio pericardial silhouette is enlarged. Vascular congestion diffuse interstitial opacity suggests edema. Bibasilar atelectasis or infiltrate noted with small bilateral pleural effusions. Stable focal pleural thickening lateral left mid chest. Bones are diffusely demineralized. Telemetry leads overlie the chest. IMPRESSION: 1. Low volume film with vascular congestion and diffuse interstitial opacity suggesting edema. 2. Bibasilar atelectasis or infiltrate with small bilateral pleural effusions. Electronically Signed    By: Camellia Candle M.D.   On: 05/10/2024 09:40     TODAY-DAY OF DISCHARGE:  Subjective:   Jeremy Ferrell today has no headache,no chest abdominal pain,no new weakness tingling or numbness, feels much better wants to go home today.   Objective:   Blood pressure (!) 142/77, pulse (!) 107, temperature 98 F (36.7 C), temperature source Oral, resp. rate (!) 27, height 5' 11 (1.803 m), weight 65.8 kg, SpO2 95%.  Intake/Output Summary (Last 24 hours) at 05/15/2024 1124 Last data filed at 05/15/2024 0300 Gross per 24 hour  Intake 0 ml  Output 750 ml  Net -750 ml   Filed Weights   05/10/24 0826 05/13/24 0416 05/14/24 0701  Weight: 60.8 kg 65.9 kg 65.8 kg    Exam: Awake Alert, Oriented *3, No new F.N deficits, Normal affect [place].AT,PERRAL Supple Neck,No JVD, No cervical lymphadenopathy appriciated.  Symmetrical Chest wall movement, Good air movement bilaterally, CTAB RRR,No Gallops,Rubs or new Murmurs, No Parasternal Heave +ve B.Sounds, Abd Soft, Non tender, No organomegaly appriciated, No rebound -guarding or rigidity. No Cyanosis, Clubbing or edema, No new Rash or bruise   PERTINENT RADIOLOGIC STUDIES: DG Swallowing Func-Speech Pathology Result Date: 05/14/2024 Table formatting from the original result was not included. Modified Barium Swallow Study Patient Details Name: Jeremy Ferrell MRN: [id number] Date of Birth: 1934/09/16 Today's Date: 05/14/2024 HPI/PMH: HPI: Patient is an 89 y.o. male with PMH: thyroid  cancer (2023), DM, HTN. He presented to the hospital on 05/10/24 with increased weakness, falls, worsening confusion and very poor PO intake x1 week. He presented to [facility] ED and was transferred to [facility] on 9/21. During evaluation, patient noted to have fallen multiple times resulting in a closed head injury. Patient with possible bladder outlet obstruction. He had worsening hypoxia on 9/23, eventually respiratory distress, intolerant of BiPAP. Per PCCM note 9/23, cannot r/o PNA  and  pt reported dysphagia. SLP consulted for evaluation of swallow safety on 9/24. Clinical Impression: Pt has an oropharyngeal dysphagia with suspected esophageal component as well. He  has some disorganized lingual transit with a collection of lingual residue. Pharyngeally he has reduced hyolaryngeal movement, and base of tongue retraction, which contribute to incomplete airway protection and pharyngeal clearance. Laryngeal vestibule closure is incomplete and with lighter boluses, his epiglottis does not move at all. Aspiration occurs with thin liquids right before the height of the swallow. Penetration occurs with many other consistencies, including even purees, and often after the swallow from residue in the pyriform sinuses and valleculae. Penetration sometimes spills down below the vocal folds into the airway (PAS 7-8). He senses aspiration most of the time, but he is not always effective in ejecting aspirates. Sometimes, penetrates and aspirates that are initially cleared, spill back into the laryngeal vestibule again before the next swallow. A chin tuck shows the most potential for improved efficiency and safety when used with solids and nectar thick liquids, but aspiration of nectar thick liquids still occurred when he took a larger sip. Pt reports trouble swallowing over the last several weeks, loss of weight, and recent PNA. Note that plan was for Cortrak placement after MBS but pt declined this, stating that he would rather eat and drink even acknowledging dysphagia and aspiration on this study. Considering this and his current respiratory status, discussed with MD who suggests maintaining NPO status pending palliative care conversation. Will leave NPO, except for a few pieces of ice after oral care with RN to help with use of swallowing musculature and secretion management. Factors that may increase risk of adverse event in presence of aspiration Noe & Lianne 2021): Factors that may increase risk of  adverse event in presence of aspiration Noe & Lianne 2021): Respiratory or GI disease; Frail or deconditioned; Weak cough Recommendations/Plan: Swallowing Evaluation Recommendations Swallowing Evaluation Recommendations Recommendations: NPO; Ice chips PRN after oral care Medication Administration: Via alternative means Oral care recommendations: Oral care QID (4x/day); Oral care before ice chips/water  Caregiver Recommendations: Have oral suction available Treatment Plan Treatment Plan Treatment recommendations: Therapy as outlined in treatment plan below Follow-up recommendations: Skilled nursing-short term rehab (<3 hours/day) Functional status assessment: Patient has had a recent decline in their functional status and demonstrates the ability to make significant improvements in function in a reasonable and predictable amount of time. Treatment frequency: Min 2x/week Treatment duration: 2 weeks Interventions: Aspiration precaution training; Compensatory techniques; Patient/family education; Oropharyngeal exercises; Trials of upgraded texture/liquids; Respiratory muscle strength training Recommendations Recommendations for follow up therapy are one component of a multi-disciplinary discharge planning process, led by the attending physician.  Recommendations may be updated based on patient status, additional functional criteria and insurance authorization. Assessment: Orofacial Exam: Orofacial Exam Oral Cavity - Dentition: Adequate natural dentition Anatomy: Anatomy: WFL Boluses Administered: Boluses Administered Boluses Administered: Thin liquids (Level 0); Mildly thick liquids (Level 2, nectar thick); Moderately thick liquids (Level 3, honey thick); Puree; Solid  Oral Impairment Domain: Oral Impairment Domain Lip Closure: No labial escape Tongue control during bolus hold: Cohesive bolus between tongue to palatal seal Bolus preparation/mastication: Timely and efficient chewing and mashing Bolus  transport/lingual motion: Repetitive/disorganized tongue motion Oral residue: Residue collection on oral structures Location of oral residue : Tongue Initiation of pharyngeal swallow : Valleculae  Pharyngeal Impairment Domain: Pharyngeal Impairment Domain Soft palate elevation: No bolus between soft palate (SP)/pharyngeal wall (PW) Laryngeal elevation: Partial superior movement of thyroid  cartilage/partial approximation of arytenoids to epiglottic petiole Anterior hyoid excursion: Partial anterior movement Epiglottic movement: No inversion Laryngeal vestibule closure: Incomplete, narrow column air/contrast in laryngeal vestibule Pharyngeal stripping wave : Present -  diminished Pharyngeal contraction (A/P view only): N/A Pharyngoesophageal segment opening: Partial distention/partial duration, partial obstruction of flow Tongue base retraction: Narrow column of contrast or air between tongue base and PPW Pharyngeal residue: Collection of residue within or on pharyngeal structures Location of pharyngeal residue: Valleculae; Pyriform sinuses; Aryepiglottic folds; Pharyngeal wall  Esophageal Impairment Domain: Esophageal Impairment Domain Esophageal clearance upright position: Esophageal retention Pill: Pill Consistency administered: -- (deferred) Penetration/Aspiration Scale Score: Penetration/Aspiration Scale Score 1.  Material does not enter airway: Solid 3.  Material enters airway, remains ABOVE vocal cords and not ejected out: Moderately thick liquids (Level 3, honey thick); Puree 4.  Material enters airway, CONTACTS cords then ejected out: Mildly thick liquids (Level 2, nectar thick) 7.  Material enters airway, passes BELOW cords and not ejected out despite cough attempt by patient: Thin liquids (Level 0) Compensatory Strategies: Compensatory Strategies Compensatory strategies: Yes Straw: Ineffective Ineffective Straw: Mildly thick liquid (Level 2, nectar thick) Effortful swallow: Ineffective Ineffective Effortful  Swallow: Puree Chin tuck: Ineffective Ineffective Chin Tuck: Puree; Mildly thick liquid (Level 2, nectar thick)   General Information: Caregiver present: No  Diet Prior to this Study: NPO   Temperature : Normal   Respiratory Status: WFL   Supplemental O2: Nasal cannula   History of Recent Intubation: No  Behavior/Cognition: Alert; Cooperative; Pleasant mood Self-Feeding Abilities: Able to self-feed Baseline vocal quality/speech: Dysphonic Volitional Cough: Able to elicit Volitional Swallow: Able to elicit Exam Limitations: No limitations Goal Planning: Prognosis for improved oropharyngeal function: Good No data recorded No data recorded Patient/Family Stated Goal: Wants to drink water . Consulted and agree with results and recommendations: Patient; Physician; Nurse; Advanced practice provider (attending MD and palliative care) Pain: Pain Assessment Pain Assessment: Faces Faces Pain Scale: 0 End of Session: Start Time:SLP Start Time (ACUTE ONLY): 0854 Stop Time: SLP Stop Time (ACUTE ONLY): 0915 Time Calculation:SLP Time Calculation (min) (ACUTE ONLY): 21 min Charges: SLP Evaluations $ SLP Speech Visit: 1 Visit SLP Evaluations $BSS Swallow: 1 Procedure $MBS Swallow: 1 Procedure SLP visit diagnosis: SLP Visit Diagnosis: Dysphagia, oropharyngeal phase (R13.12) Past Medical History: Past Medical History: Diagnosis Date  Cancer (HCC) 05/2022  Thyroid  Cancer  Diabetes (HCC)   Lantus  98 units BID  Hypertension  Past Surgical History: Past Surgical History: Procedure Laterality Date  CATARACT EXTRACTION, BILATERAL    CHOLECYSTECTOMY    EYE SURGERY Right   Macular degenaration and hemmoraghe  RADICAL NECK DISSECTION Right 07/23/2022  Procedure: RIGHT PARTIAL NECK DISSECTION;  Surgeon: Jesus Oliphant, MD;  Location: [facility] OR;  Service: ENT;  Laterality: Right;  THYROID  CYST EXCISION    THYROIDECTOMY Bilateral 07/23/2022  Procedure: TOTAL THYROIDECTOMY;  Surgeon: Jesus Oliphant, MD;  Location: [facility] OR;  Service: ENT;  Laterality:  Bilateral; Leita SAILOR., M.A. CCC-SLP Acute Rehabilitation Services Office: [phone] Secure chat preferred 05/14/2024, 11:31 AM  DG Chest Port 1V same Day Result Date: 05/14/2024 EXAM: 1 VIEW(S) XRAY OF THE CHEST 05/14/2024 07:01:25 AM COMPARISON: Comparison none slash 23 slash 25. CLINICAL HISTORY: SOB (shortness of breath) Sob,congested; rover. FINDINGS: LUNGS AND PLEURA: Unchanged asymmetric elevation of the right hemidiaphragm with persistent moderate right pleural effusion and small left pleural effusion. Atelectasis noted within the perihilar left mid lung. Similar appearance of pulmonary vascular congestion. No pneumothorax. HEART AND MEDIASTINUM: Stable cardiac enlargement. Aortic atherosclerotic calcification. No acute abnormality of the mediastinal silhouette. BONES AND SOFT TISSUES: No acute osseous abnormality. IMPRESSION: 1. Persistent moderate right pleural effusion and small left pleural effusion. 2. Atelectasis within the perihilar left mid lung.  3. Similar appearance of pulmonary vascular congestion. Electronically signed by: Waddell Calk MD 05/14/2024 07:05 AM EDT RP Workstation: [id number]   ECHOCARDIOGRAM COMPLETE Result Date: 05/13/2024    ECHOCARDIOGRAM REPORT   Patient Name:   Jeremy Ferrell Date of Exam: 05/13/2024 Medical Rec #:  [id number]          Height:       71.0 in Accession #:    [id number]         Weight:       145.3 lb Date of Birth:  1935-02-17          BSA:          1.841 m Patient Age:    88 years           BP:           128/67 mmHg Patient Gender: M                  HR:           117 bpm. Exam Location:  Inpatient Procedure: 2D Echo, Cardiac Doppler and Color Doppler (Both Spectral and Color            Flow Doppler were utilized during procedure). Indications:    I50.40* Unspecified combined systolic (congestive) and diastolic                 (congestive) heart failure  History:        Patient has no prior history of Echocardiogram examinations.  Sonographer:    Ellouise Mose RDCS Referring Phys: [id number] STEPHEN K CHIU  Sonographer Comments: Technically difficult study due to poor echo windows. Very high parasternal window, very high lateral apical window. Patient uncomfortable, exam expedited. IMPRESSIONS  1. Left ventricular ejection fraction, by estimation, is 70 to 75%. Left ventricular ejection fraction by 2D MOD biplane is 66.6 %. Left ventricular ejection fraction by PLAX is 66 %. The left ventricle has hyperdynamic function. The left ventricle has no regional wall motion abnormalities. There is mild left ventricular hypertrophy of the basal-septal segment. Indeterminate diastolic filling due to E-A fusion.  2. Right ventricular systolic function is normal. The right ventricular size is normal. There is normal pulmonary artery systolic pressure. The estimated right ventricular systolic pressure is 34.8 mmHg.  3. Large pleural effusion.  4. The mitral valve is abnormal. Mild mitral valve regurgitation. No evidence of mitral stenosis. 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  5. The aortic valve is tricuspid. Aortic valve regurgitation is not visualized. No aortic stenosis is present.  6. The inferior vena cava is normal in size with greater than 50% respiratory variability, suggesting right atrial pressure of 3 mmHg. Comparison(s): No prior Echocardiogram. FINDINGS  Left Ventricle: Left ventricular ejection fraction, by estimation, is 70 to 75%. Left ventricular ejection fraction by PLAX is 66 %. Left ventricular ejection fraction by 2D MOD biplane is 66.6 %. The left ventricle has hyperdynamic function. The left ventricle has no regional wall motion abnormalities. The left ventricular internal cavity size was normal in size. There is mild left ventricular hypertrophy of the basal-septal segment. Indeterminate diastolic filling due to E-A fusion. Right Ventricle: The right ventricular size is normal. Right ventricular systolic function is normal. There is  normal pulmonary artery systolic pressure. The tricuspid regurgitant velocity is 2.82 m/s, and with an assumed right atrial pressure of 3 mmHg,  the estimated right ventricular systolic pressure is 34.8 mmHg. Left Atrium: Left atrial  size was normal in size. Right Atrium: Right atrial size was normal in size. Pericardium: There is no evidence of pericardial effusion. Mitral Valve: The mitral valve is abnormal. 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 Mild mitral valve regurgitation. No evidence of mitral valve stenosis. Tricuspid Valve: The tricuspid valve is normal in structure. Tricuspid valve regurgitation is mild . No evidence of tricuspid stenosis. Aortic Valve: The aortic valve is tricuspid. Aortic valve regurgitation is not visualized. No aortic stenosis is present. Pulmonic Valve: The pulmonic valve was normal in structure. Pulmonic valve regurgitation is not visualized. No evidence of pulmonic stenosis. Aorta: The aortic root and ascending aorta are structurally normal, with no evidence of dilitation. Venous: The inferior vena cava is normal in size with greater than 50% respiratory variability, suggesting right atrial pressure of 3 mmHg. IAS/Shunts: The interatrial septum is aneurysmal. No atrial level shunt detected by color flow Doppler. Additional Comments: There is a large pleural effusion.  LEFT VENTRICLE PLAX 2D                        Biplane EF (MOD) LV EF:         Left            LV Biplane EF:   Left                ventricular                      ventricular                ejection                         ejection                fraction by                      fraction by                PLAX is 66                       2D MOD                %.                               biplane is LVIDd:         4.47 cm                          66.6 %. LVIDs:         2.86 cm LV PW:         1.06 cm         Diastology LV IVS:        1.00 cm         LV e' medial:    21.10 cm/s LVOT diam:      2.37 cm         LV E/e' medial:  6.7 LV SV:         76              LV e' lateral:   32.90 cm/s LV SV Index:   41  LV E/e' lateral: 4.3 LVOT Area:     4.41 cm  LV Volumes (MOD) LV vol d, MOD    92.5 ml A2C: LV vol d, MOD    66.8 ml A4C: LV vol s, MOD    32.1 ml A2C: LV vol s, MOD    24.6 ml A4C: LV SV MOD A2C:   60.4 ml LV SV MOD A4C:   66.8 ml LV SV MOD BP:    56.1 ml RIGHT VENTRICLE             IVC RV S prime:     21.80 cm/s  IVC diam: 2.33 cm TAPSE (M-mode): 1.6 cm LEFT ATRIUM             Index        RIGHT ATRIUM          Index LA diam:        4.01 cm 2.18 cm/m   RA Area:     8.91 cm LA Vol (A2C):   23.2 ml 12.60 ml/m  RA Volume:   12.20 ml 6.63 ml/m LA Vol (A4C):   22.0 ml 11.95 ml/m LA Biplane Vol: 22.3 ml 12.11 ml/m  AORTIC VALVE LVOT Vmax:   121.00 cm/s LVOT Vmean:  81.800 cm/s LVOT VTI:    0.172 m  AORTA Ao Root diam: 3.85 cm Ao Asc diam:  3.55 cm MITRAL VALVE                TRICUSPID VALVE MV Area (PHT): 7.59 cm     TR Peak grad:   31.8 mmHg MV Decel Time: 100 msec     TR Vmax:        282.00 cm/s MV E velocity: 142.00 cm/s                             SHUNTS                             Systemic VTI:  0.17 m                             Systemic Diam: 2.37 cm Redell Shallow MD Electronically signed by Redell Shallow MD Signature Date/Time: 05/13/2024/1:58:52 PM    Final      PERTINENT LAB RESULTS: CBC: Recent Labs    05/14/24 0400 05/14/24 2010 05/15/24 0030 05/15/24 0716  WBC 11.7*  --  9.5  --   HGB 13.2   < > 11.0* 10.5*  HCT 37.7*   < > 32.2* 30.8*  PLT 439*  --  431*  --    < > = values in this interval not displayed.   CMET CMP     Component Value Date/Time   NA 148 (H) 05/15/2024 0030   K 3.7 05/15/2024 0030   CL 107 05/15/2024 0030   CO2 26 05/15/2024 0030   GLUCOSE 229 (H) 05/15/2024 0030   BUN 50 (H) 05/15/2024 0030   CREATININE 1.15 05/15/2024 0030   CALCIUM  7.9 (L) 05/15/2024 0030   CALCIUM  13.5 Result repeated and verified. (HH) 09/14/2010 1843    PROT 4.6 (L) 05/12/2024 0506   ALBUMIN  2.0 (L) 05/12/2024 0506   AST 17 05/12/2024 0506   ALT 13 05/12/2024 0506   ALKPHOS 49 05/12/2024 0506   BILITOT 0.7 05/12/2024 0506   GFRNONAA >60 05/15/2024 0030  GFR Estimated Creatinine Clearance: 42.9 mL/min (by C-G formula based on SCr of 1.15 mg/dL). No results for input(s): LIPASE, AMYLASE in the last 72 hours. No results for input(s): CKTOTAL, CKMB, CKMBINDEX, TROPONINI in the last 72 hours. Invalid input(s): POCBNP No results for input(s): DDIMER in the last 72 hours. No results for input(s): HGBA1C in the last 72 hours. No results for input(s): CHOL, HDL, LDLCALC, TRIG, CHOLHDL, LDLDIRECT in the last 72 hours. No results for input(s): TSH, T4TOTAL, T3FREE, THYROIDAB in the last 72 hours.  Invalid input(s): FREET3 No results for input(s): VITAMINB12, FOLATE, FERRITIN, TIBC, IRON, RETICCTPCT in the last 72 hours. Coags: No results for input(s): INR in the last 72 hours.  Invalid input(s): PT Microbiology: Recent Results (from the past 240 hours)  Urine Culture (for pregnant, neutropenic or urologic patients or patients with an indwelling urinary catheter)     Status: None   Collection Time: 05/11/24  2:50 AM   Specimen: Urine, Catheterized  Result Value Ref Range Status   Specimen Description URINE, CATHETERIZED  Final   Special Requests NONE  Final   Culture   Final    NO GROWTH Performed at [facility], [address], [place], [place] [address]    Report Status 05/12/2024 FINAL  Final  Culture, blood (x 2)     Status: None (Preliminary result)   Collection Time: 05/11/24  6:32 AM   Specimen: BLOOD LEFT ARM  Result Value Ref Range Status   Specimen Description BLOOD LEFT ARM  Final   Special Requests   Final    BOTTLES DRAWN AEROBIC AND ANAEROBIC Blood Culture results may not be optimal due to an inadequate volume of blood received in culture bottles    Culture   Final    NO GROWTH 4 DAYS Performed at [facility], [address], [place], [place] [address]    Report Status PENDING  Incomplete  Culture, blood (x 2)     Status: None (Preliminary result)   Collection Time: 05/11/24  7:34 AM   Specimen: BLOOD RIGHT HAND  Result Value Ref Range Status   Specimen Description BLOOD RIGHT HAND  Final   Special Requests   Final    BOTTLES DRAWN AEROBIC AND ANAEROBIC Blood Culture adequate volume   Culture   Final    NO GROWTH 4 DAYS Performed at [facility], [address], [place], [place] [address]    Report Status PENDING  Incomplete  MRSA Next Gen by PCR, Nasal     Status: None   Collection Time: 05/12/24  2:05 PM   Specimen: Pleural; Nasal Swab  Result Value Ref Range Status   MRSA by PCR Next Gen NOT DETECTED NOT DETECTED Final    Comment: (NOTE) The GeneXpert MRSA Assay (FDA approved for NASAL specimens only), is one component of a comprehensive MRSA colonization surveillance program. It is not intended to diagnose MRSA infection nor to guide or monitor treatment for MRSA infections. Test performance is not FDA approved in patients less than 48 years old. Performed at [facility], [address], [place], [place] [address]   Body fluid culture w Gram Stain     Status: None (Preliminary result)   Collection Time: 05/12/24  3:18 PM   Specimen: Pleural Fluid  Result Value Ref Range Status   Specimen Description PLEURAL  Final   Special Requests LEFT  Final   Gram Stain   Final    RARE WBC  PRESENT, PREDOMINANTLY MONONUCLEAR NO ORGANISMS SEEN    Culture   Final    NO GROWTH 3 DAYS Performed at [facility], [address], [place], [place] [address]    Report Status PENDING  Incomplete  Body fluid culture w Gram Stain     Status: None (Preliminary result)   Collection Time: 05/12/24  3:43 PM   Specimen: Pleural Fluid  Result Value Ref Range Status   Specimen Description PLEURAL  Final   Special  Requests RIGHT  Final   Gram Stain NO WBC SEEN NO ORGANISMS SEEN   Final   Culture   Final    NO GROWTH 3 DAYS Performed at [facility], [address], [place], [place] [address]    Report Status PENDING  Incomplete    FURTHER DISCHARGE INSTRUCTIONS:  Get Medicines reviewed and adjusted: Please take all your medications with you for your next visit with your Primary MD  Laboratory/radiological data: Please request your Primary MD to go over all hospital tests and procedure/radiological results at the follow up, please ask your Primary MD to get all Hospital records sent to his/her office.  In some cases, they will be blood work, cultures and biopsy results pending at the time of your discharge. Please request that your primary care M.D. goes through all the records of your hospital data and follows up on these results.  Also Note the following: If you experience worsening of your admission symptoms, develop shortness of breath, life threatening emergency, suicidal or homicidal thoughts you must seek medical attention immediately by calling 911 or calling your MD immediately  if symptoms less severe.  You must read complete instructions/literature along with all the possible adverse reactions/side effects for all the Medicines you take and that have been prescribed to you. Take any new Medicines after you have completely understood and accpet all the possible adverse reactions/side effects.   Do not drive when taking Pain medications or sleeping medications (Benzodaizepines)  Do not take more than prescribed Pain, Sleep and Anxiety Medications. It is not advisable to combine anxiety,sleep and pain medications without talking with your primary care practitioner  Special Instructions: If you have smoked or chewed Tobacco  in the last 2 yrs please stop smoking, stop any regular Alcohol   and or any Recreational drug use.  Wear Seat belts while driving.  Please note: You were  cared for by a hospitalist during your hospital stay. Once you are discharged, your primary care physician will handle any further medical issues. Please note that NO REFILLS for any discharge medications will be authorized once you are discharged, as it is imperative that you return to your primary care physician (or establish a relationship with a primary care physician if you do not have one) for your post hospital discharge needs so that they can reassess your need for medications and monitor your lab values.  Total Time spent coordinating discharge including counseling, education and face to face time equals greater than 30 minutes.  SignedBETHA Donalda Applebaum 05/15/2024 11:24 AM

## 2024-05-15 NOTE — Progress Notes (Signed)
 Called [facility] and gave report to nurse admitting patient.

## 2024-05-15 NOTE — TOC Transition Note (Signed)
 Transition of Care [facility]) - Discharge Note   Patient Details  Name: Jeremy Ferrell MRN: [id number] Date of Birth: June 03, 1935  [facility] CM/SW Contact:  [name], LCSW Phone Number: 05/15/2024, 5:07 PM   Clinical Narrative:    Patient will DC to: [facility] Anticipated DC date: 05/15/24 Family notified: Son Transport by: [facility] (4th on the list)   Per MD patient ready for DC to [facility]. RN to call report prior to discharge ( [phone]). RN, patient, patient's family, and facility notified of DC. Discharge Summary sent to facility. DC packet on chart including signed DNR. Ambulance transport requested for patient.   CSW will sign off for now as social work intervention is no longer needed. Please consult us  again if new needs arise.     Final next level of care: Hospice Medical Facility Barriers to Discharge: Barriers Resolved   Patient Goals and CMS Choice Patient states their goals for this hospitalization and ongoing recovery are:: Comfort CMS Medicare.gov Compare Post Acute Care list provided to:: Patient Represenative (must comment) Choice offered to / list presented to : Adult Children [place] ownership interest in [facility].provided to:: Adult Children    Discharge Placement              Patient [facility] bed at:  [facility]) Patient to be transferred to facility by: [facility] Name of family member notified: Son Patient and family notified of of transfer: 05/15/24  Discharge Plan and Services Additional resources added to the After Visit Summary for   In-house Referral: Clinical Social Work   Post Acute Care Choice: Hospice                               Social Drivers of Health (SDOH) Interventions SDOH Screenings   Food Insecurity: No Food Insecurity (05/10/2024)  Housing: Low Risk  (05/10/2024)  Transportation Needs: No Transportation Needs (05/10/2024)  Utilities: Not At Risk (05/10/2024)   Social Connections: Moderately Integrated (05/10/2024)  Tobacco Use: Medium Risk (05/10/2024)     Readmission Risk Interventions     No data to display

## 2024-05-16 LAB — BODY FLUID CULTURE W GRAM STAIN
Culture: NO GROWTH
Culture: NO GROWTH
Gram Stain: NONE SEEN

## 2024-05-16 LAB — CULTURE, BLOOD (ROUTINE X 2)
Culture: NO GROWTH
Culture: NO GROWTH
Special Requests: ADEQUATE

## 2024-05-19 LAB — CYTOLOGY - NON PAP

## 2024-05-20 DEATH — deceased
